# Patient Record
Sex: Male | Born: 1975 | Race: Black or African American | Hispanic: No | Marital: Married | Smoking: Current every day smoker
Health system: Southern US, Community
[De-identification: ages and names within clinical notes are randomized; demographics above are authoritative.]

## PROBLEM LIST (undated history)

## (undated) DIAGNOSIS — M549 Dorsalgia, unspecified: Secondary | ICD-10-CM

## (undated) DIAGNOSIS — I10 Essential (primary) hypertension: Secondary | ICD-10-CM

## (undated) DIAGNOSIS — I639 Cerebral infarction, unspecified: Secondary | ICD-10-CM

## (undated) DIAGNOSIS — N289 Disorder of kidney and ureter, unspecified: Secondary | ICD-10-CM

## (undated) DIAGNOSIS — R51 Headache: Secondary | ICD-10-CM

## (undated) DIAGNOSIS — Z8489 Family history of other specified conditions: Secondary | ICD-10-CM

## (undated) DIAGNOSIS — M1990 Unspecified osteoarthritis, unspecified site: Secondary | ICD-10-CM

## (undated) DIAGNOSIS — J45909 Unspecified asthma, uncomplicated: Secondary | ICD-10-CM

## (undated) DIAGNOSIS — G4730 Sleep apnea, unspecified: Secondary | ICD-10-CM

## (undated) HISTORY — PX: APPENDECTOMY: SHX54

## (undated) HISTORY — PX: OTHER SURGICAL HISTORY: SHX169

## (undated) HISTORY — PX: DG AV DIALYSIS  SHUNT ACCESS EXIST*L* OR: HXRAD910

## (undated) HISTORY — PX: NEPHRECTOMY TRANSPLANTED ORGAN: SUR880

---
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
## 2004-01-15 ENCOUNTER — Ambulatory Visit: Payer: Self-pay | Admitting: *Deleted

## 2004-01-15 ENCOUNTER — Ambulatory Visit: Payer: Self-pay | Admitting: Internal Medicine

## 2004-01-18 ENCOUNTER — Ambulatory Visit: Payer: Self-pay | Admitting: Internal Medicine

## 2004-01-26 ENCOUNTER — Ambulatory Visit: Payer: Self-pay | Admitting: Internal Medicine

## 2004-02-09 ENCOUNTER — Ambulatory Visit: Payer: Self-pay | Admitting: Internal Medicine

## 2004-03-11 ENCOUNTER — Ambulatory Visit: Payer: Self-pay | Admitting: Internal Medicine

## 2004-03-13 ENCOUNTER — Ambulatory Visit: Payer: Self-pay | Admitting: Internal Medicine

## 2004-04-08 ENCOUNTER — Ambulatory Visit: Payer: Self-pay | Admitting: Internal Medicine

## 2004-04-12 ENCOUNTER — Ambulatory Visit: Payer: Self-pay | Admitting: Internal Medicine

## 2004-04-15 ENCOUNTER — Ambulatory Visit (HOSPITAL_COMMUNITY): Admission: RE | Admit: 2004-04-15 | Discharge: 2004-04-15 | Payer: Self-pay | Admitting: Internal Medicine

## 2004-06-29 ENCOUNTER — Emergency Department (HOSPITAL_COMMUNITY): Admission: EM | Admit: 2004-06-29 | Discharge: 2004-06-29 | Payer: Self-pay | Admitting: Emergency Medicine

## 2004-11-04 ENCOUNTER — Ambulatory Visit: Payer: Self-pay | Admitting: Internal Medicine

## 2005-01-07 ENCOUNTER — Ambulatory Visit: Payer: Self-pay | Admitting: Internal Medicine

## 2005-05-27 ENCOUNTER — Ambulatory Visit: Payer: Self-pay | Admitting: Internal Medicine

## 2005-06-03 ENCOUNTER — Ambulatory Visit (HOSPITAL_COMMUNITY): Admission: RE | Admit: 2005-06-03 | Discharge: 2005-06-03 | Payer: Self-pay | Admitting: Vascular Surgery

## 2005-06-15 ENCOUNTER — Emergency Department (HOSPITAL_COMMUNITY): Admission: EM | Admit: 2005-06-15 | Discharge: 2005-06-15 | Payer: Self-pay | Admitting: Emergency Medicine

## 2005-06-24 ENCOUNTER — Ambulatory Visit (HOSPITAL_BASED_OUTPATIENT_CLINIC_OR_DEPARTMENT_OTHER): Admission: RE | Admit: 2005-06-24 | Discharge: 2005-06-24 | Payer: Self-pay | Admitting: Nephrology

## 2005-06-29 ENCOUNTER — Ambulatory Visit: Payer: Self-pay | Admitting: Internal Medicine

## 2005-09-02 ENCOUNTER — Encounter: Payer: Self-pay | Admitting: Internal Medicine

## 2005-09-03 ENCOUNTER — Encounter (INDEPENDENT_AMBULATORY_CARE_PROVIDER_SITE_OTHER): Payer: Self-pay | Admitting: Specialist

## 2005-09-03 ENCOUNTER — Observation Stay (HOSPITAL_COMMUNITY): Admission: AD | Admit: 2005-09-03 | Discharge: 2005-09-03 | Payer: Self-pay | Admitting: General Surgery

## 2005-09-09 ENCOUNTER — Ambulatory Visit (HOSPITAL_COMMUNITY): Admission: RE | Admit: 2005-09-09 | Discharge: 2005-09-09 | Payer: Self-pay | Admitting: Nephrology

## 2005-09-30 ENCOUNTER — Ambulatory Visit (HOSPITAL_COMMUNITY): Admission: RE | Admit: 2005-09-30 | Discharge: 2005-09-30 | Payer: Self-pay | Admitting: Vascular Surgery

## 2005-10-13 ENCOUNTER — Emergency Department (HOSPITAL_COMMUNITY): Admission: EM | Admit: 2005-10-13 | Discharge: 2005-10-13 | Payer: Self-pay | Admitting: Emergency Medicine

## 2006-01-15 ENCOUNTER — Emergency Department (HOSPITAL_COMMUNITY): Admission: EM | Admit: 2006-01-15 | Discharge: 2006-01-15 | Payer: Self-pay | Admitting: Emergency Medicine

## 2006-06-30 ENCOUNTER — Encounter: Admission: RE | Admit: 2006-06-30 | Discharge: 2006-06-30 | Payer: Self-pay | Admitting: Nephrology

## 2006-08-10 ENCOUNTER — Emergency Department (HOSPITAL_COMMUNITY): Admission: EM | Admit: 2006-08-10 | Discharge: 2006-08-10 | Payer: Self-pay | Admitting: Emergency Medicine

## 2008-10-29 ENCOUNTER — Emergency Department (HOSPITAL_COMMUNITY): Admission: EM | Admit: 2008-10-29 | Discharge: 2008-10-29 | Payer: Self-pay | Admitting: Emergency Medicine

## 2009-01-30 ENCOUNTER — Ambulatory Visit: Payer: Self-pay | Admitting: Vascular Surgery

## 2009-02-20 ENCOUNTER — Encounter: Admission: RE | Admit: 2009-02-20 | Discharge: 2009-02-20 | Payer: Self-pay | Admitting: Nephrology

## 2009-03-09 ENCOUNTER — Ambulatory Visit (HOSPITAL_COMMUNITY): Admission: RE | Admit: 2009-03-09 | Discharge: 2009-03-09 | Payer: Self-pay | Admitting: Vascular Surgery

## 2009-03-09 ENCOUNTER — Ambulatory Visit: Payer: Self-pay | Admitting: Vascular Surgery

## 2009-04-26 ENCOUNTER — Ambulatory Visit: Payer: Self-pay | Admitting: Vascular Surgery

## 2009-05-04 ENCOUNTER — Emergency Department (HOSPITAL_COMMUNITY): Admission: EM | Admit: 2009-05-04 | Discharge: 2009-05-04 | Payer: Self-pay | Admitting: Emergency Medicine

## 2009-05-15 ENCOUNTER — Ambulatory Visit (HOSPITAL_COMMUNITY): Admission: RE | Admit: 2009-05-15 | Discharge: 2009-05-15 | Payer: Self-pay | Admitting: Vascular Surgery

## 2009-05-15 ENCOUNTER — Ambulatory Visit: Payer: Self-pay | Admitting: Vascular Surgery

## 2009-06-05 ENCOUNTER — Ambulatory Visit: Payer: Self-pay | Admitting: Vascular Surgery

## 2009-06-28 ENCOUNTER — Ambulatory Visit: Payer: Self-pay | Admitting: Vascular Surgery

## 2009-07-03 ENCOUNTER — Ambulatory Visit (HOSPITAL_COMMUNITY): Admission: RE | Admit: 2009-07-03 | Discharge: 2009-07-03 | Payer: Self-pay | Admitting: Nephrology

## 2009-07-04 ENCOUNTER — Ambulatory Visit (HOSPITAL_COMMUNITY): Admission: RE | Admit: 2009-07-04 | Discharge: 2009-07-04 | Payer: Self-pay | Admitting: Nephrology

## 2009-08-02 ENCOUNTER — Ambulatory Visit: Payer: Self-pay | Admitting: Vascular Surgery

## 2009-08-07 ENCOUNTER — Ambulatory Visit: Payer: Self-pay | Admitting: Vascular Surgery

## 2009-08-07 ENCOUNTER — Ambulatory Visit (HOSPITAL_COMMUNITY): Admission: RE | Admit: 2009-08-07 | Discharge: 2009-08-07 | Payer: Self-pay | Admitting: Vascular Surgery

## 2009-08-30 ENCOUNTER — Ambulatory Visit: Payer: Self-pay | Admitting: Vascular Surgery

## 2009-10-31 ENCOUNTER — Ambulatory Visit (HOSPITAL_COMMUNITY): Admission: RE | Admit: 2009-10-31 | Discharge: 2009-10-31 | Payer: Self-pay | Admitting: Nephrology

## 2010-04-13 ENCOUNTER — Encounter: Payer: Self-pay | Admitting: Internal Medicine

## 2010-06-10 LAB — POCT I-STAT 4, (NA,K, GLUC, HGB,HCT)
Glucose, Bld: 98 mg/dL (ref 70–99)
Hemoglobin: 14.3 g/dL (ref 13.0–17.0)
Potassium: 4.9 mEq/L (ref 3.5–5.1)
Sodium: 137 mEq/L (ref 135–145)

## 2010-06-12 LAB — DIFFERENTIAL
Basophils Relative: 0 % (ref 0–1)
Lymphocytes Relative: 13 % (ref 12–46)
Lymphs Abs: 0.9 10*3/uL (ref 0.7–4.0)
Monocytes Absolute: 0.4 10*3/uL (ref 0.1–1.0)
Monocytes Relative: 6 % (ref 3–12)
Neutro Abs: 5.1 10*3/uL (ref 1.7–7.7)
Neutrophils Relative %: 77 % (ref 43–77)

## 2010-06-12 LAB — COMPREHENSIVE METABOLIC PANEL
Albumin: 3.4 g/dL — ABNORMAL LOW (ref 3.5–5.2)
BUN: 30 mg/dL — ABNORMAL HIGH (ref 6–23)
Calcium: 9 mg/dL (ref 8.4–10.5)
Glucose, Bld: 136 mg/dL — ABNORMAL HIGH (ref 70–99)
Total Protein: 7.8 g/dL (ref 6.0–8.3)

## 2010-06-12 LAB — CBC
HCT: 37 % — ABNORMAL LOW (ref 39.0–52.0)
Hemoglobin: 12.7 g/dL — ABNORMAL LOW (ref 13.0–17.0)
MCHC: 34.4 g/dL (ref 30.0–36.0)
Platelets: 182 10*3/uL (ref 150–400)
RDW: 13.5 % (ref 11.5–15.5)

## 2010-06-12 LAB — POCT I-STAT 4, (NA,K, GLUC, HGB,HCT)
HCT: 35 % — ABNORMAL LOW (ref 39.0–52.0)
Hemoglobin: 11.9 g/dL — ABNORMAL LOW (ref 13.0–17.0)

## 2010-06-12 LAB — POTASSIUM: Potassium: 6 mEq/L — ABNORMAL HIGH (ref 3.5–5.1)

## 2010-06-24 LAB — POCT I-STAT 4, (NA,K, GLUC, HGB,HCT)
Potassium: 6.1 mEq/L — ABNORMAL HIGH (ref 3.5–5.1)
Sodium: 138 mEq/L (ref 135–145)

## 2010-06-24 LAB — POTASSIUM: Potassium: 6.1 mEq/L — ABNORMAL HIGH (ref 3.5–5.1)

## 2010-08-06 NOTE — Assessment & Plan Note (Signed)
OFFICE VISIT   Bradley [name], Bradley [name]  DOB:  [birth year]                                       08/02/2009  GNFAO#:[id number]   I saw the patient in the office today with a poorly maturing right  forearm AV fistula.  He had this fistula placed on 05/15/2009.  They  have attempted cannulation and he has had some problems with  infiltration and ecchymosis.  He was therefore sent for further  evaluation.  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   On examination the fistula has a good thrill although it is more  difficult to follow centrally.  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   I did interrogate with the duplex scanner myself today and there appears  to be a very large branch in the distal third of the forearm which is  easily identifiable by duplex and I think ligating this certainly should  help improve the chances of this fistula maturing adequately.  For this  reason I have recommended we ligate this competing branch and we will  also shoot an intraoperative fistulogram at that time to look for any  other potential problems.  In addition, while he is having anesthesia we  will go ahead and excise the aneurysm in his left upper arm fistula  which is bothering him quite a bit.  They could use his catheter for IV  access given that we will be working on both arms.  This has been  scheduled for a nondialysis day on 08/07/2009.     Di Kindle. Edilia Bo, M.D.  Electronically Signed   CSD/MEDQ  D:  08/02/2009  T:  08/03/2009  Job:  [id number]   cc:   [facility]

## 2010-08-06 NOTE — Procedures (Signed)
VASCULAR LAB EXAM   INDICATION:  Evaluation of an AV fistula.  History of renal failure.   HISTORY:  Diabetes:  Yes.  Cardiac:  Unknown.  Hypertension:  Unknown.   EXAM:  Right upper extremity AV fistula.   IMPRESSION:  1. No evidence suggesting steal to or away from the hand.  2. Velocities are >600 cm/s, suggesting distal anastomosis and distal      arteriovenous fistula stenosis.  3. Velocities are >400 cm/s, suggest stenosis in the mid and distal      radial artery.  4. A branch in the mid forearm taking away flow from arteriovenous      fistula.  Pressures with compression in mid arteriovenous fistula      are 255 cm/s and without compression and with arteriovenous fistula      is 137 cm/s.   ___________________________________________  Quita Skye. Hart Rochester, M.D.   CJ/MEDQ  D:  06/05/2009  T:  06/06/2009  Job:  [id number]

## 2010-08-06 NOTE — Assessment & Plan Note (Signed)
OFFICE VISIT   Bradley [name], Bradley [name]  DOB:  [birth year]                                       06/05/2009  GEXBM#:[id number]   Patient is a 35 year old black male with end-stage renal disease.  He is  status post placement of a right forearm arteriovenous fistula by Dr.  [name] on 05/15/2009.  Today he is being evaluated for  possible steal syndrome.  He reports that just after surgery, he  developed fairly severe numbness in his right thumb.  He also had pain  from his right wrist to the tip of his right thumb.  Apparently, he was  told it should get better in a couple days; however, it did not.  For  the first 2 weeks, symptoms were fairly severe but says over the last  week, they have slowly been starting to improve.  He initially had  problems with being able to grip things with his right hand but said  this has gotten much better as well.  Dr. Kathrene Bongo asked if we could  do evaluation for steal syndrome to rule out that this was causing his  symptoms.   He did have a vascular lab evaluation of his fistula, which showed no  evidence of steal to the hand but did show some branches in the AV  fistula that was taking flow away from it.  This was reviewed by Dr.  Josephina Gip.  At this point, it is felt too early to know if these  branches will interfere with the fistula's maturation.   Physical exam findings today show a blood pressure of 145/94, heart rate  of 90, and respirations of 20.  He is a well-nourished black male who  appears his stated age.  He has a right radiocephalic AV fistula with  incisions appear well-healed.  He did have a suture knot protruding  through the incision, which was cut.  There was no drainage or redness  from this area, however.  The fistula has a palpable thrill up to near  the antecubital area.  It is easy to palpate.  He has a 2+ ulnar pulse.  The hand is warm.  His grips are both relatively strong.   Patient  is 3 weeks status post a right Cimino AV fistula.  He continues  to have right thumb numbness with pain over this area.  The pain is more  pronounced with touching.  There is no redness to indicate an infection.  Tests do not indicate steal; therefore, I feel that the pain is more  related to intraoperative irritation of the radial nerve.  By his  report, it has been improving and suspect it will continue to improve  over the next several months.  He already has an appointment to see Dr.  Edilia Bo on April 7th.  We will have him keep this appointment so that  his fistula can be evaluated for further maturation and to ensure that  his symptoms continue to improve.  He was instructed to call sooner if  his symptoms worsen.  I did give him a prescription for oxycodone 5 mg 1  tablet p.o. q.4h. p.r.n. for pain if needed.  He was told that this  would not help with his numbness, however.  Twenty pills were  prescribed.   Jerold Coombe, PA   Quita Skye.  Hart Rochester, M.D.  Electronically Signed   AWZ/MEDQ  D:  06/05/2009  T:  06/06/2009  Job:  [id number]   cc:   Cecille Aver, M.D.

## 2010-08-06 NOTE — Assessment & Plan Note (Signed)
OFFICE VISIT   Bradley [name], Bradley [name]  DOB:  05-08-1975                                       04/26/2009  ZOXWR#:[id number]   Mr. Giammona is a patient in whom Dr. Edilia Bo has previously performed a  left brachiocephalic fistula and most recently placed a palindrome  catheter.  He returns to clinic today for evaluation of his cephalic  fistula.  The clinic physician at this time is Dr. Edilia Bo.   Mr. Knope is a very pleasant 35 year old gentleman who currently is in  end-stage renal disease, on hemodialysis.  As was previously stated, he  has had a right brachiocephalic fistula placed sometime ago.  Over the  life of his fistula, it has currently become aneurysmal.  It was felt  back in December that placement of a palindrome catheter would be  appropriate in order to allow time for the fistula to rest.  At that  time, he was having problems with blood clotting in his fistula.   His past medical history does consists of high blood pressure, which is  controlled at present.  He is hypothyroid, which is also controlled.   He is married.  He has 1 child, and his wife is pregnant at this time.  He currently smokes approximately 1/2 pack of cigarettes per day.   REVIEW OF SYSTEMS:  With the exception of the fistula difficulties, was  entirely negative.   PHYSICAL FINDINGS:  Blood pressure was 125/78.  O2 saturation was 100%.  Heart rate was 107.  The patient was well-nourished and in no apparent  distress.  HEENT:  PERRLA, EOMI, and normal conjunctiva.  Lungs were  clear to auscultation.  Cardiac exam revealed a regular rate and rhythm.  Musculoskeletal exam demonstrated no major deformities.  There were no  focal weaknesses or paresthesias.  The fistula itself was aneurysmal in  the proximal portion with some fluctuation.  There was several other  aneurysms distally.   At this time, I requested Dr. Edilia Bo to enter assistance for the  evaluation of this patient.  He  entered the room and evaluated Mr. Kessner  and his fistula.  We elected to obtain upper extremity vein mapping to  evaluate him for another fistula in his left arm.  He did appear to have  adequate vein, and Dr. Edilia Bo did offer one ligation of his existing  fistula with placement of another AV fistula, to be done on February 22.   Of note, he does have an allergy to penicillin.   His current medications consist of Sensipar 60 mg p.o. daily, lisinopril  40 mg p.o. daily, Percocet 10/325 one p.o. q.12h. p.r.n. pain, ibuprofen  800 mg p.o. q.8h. p.r.n. pain, Vicodin as directed, Monopril 40 mg 1  p.o. daily, Fosrenol 1 p.o. with meals, PhosLo 667 mg 3 tablets with  meals, Nephro-Vite 1 p.o. daily, Plavix 75 mg p.o. daily.   Plan is for surgery on February 22nd for placement of an AV fistula and  ligation of existing fistula.   Wilmon Arms, PA   Di Kindle. Edilia Bo, M.D.  Electronically Signed   KEL/MEDQ  D:  04/26/2009  T:  04/26/2009  Job:  [id number]

## 2010-08-06 NOTE — Assessment & Plan Note (Signed)
OFFICE VISIT   Paglia, Dade B  DOB:  1975-12-14                                       08/30/2009  VWUJW#:[id number]   I saw Mr. Ocallaghan in the office today for followup after ligating a large  competing branch of his right forearm AV fistula.  We also excised the  aneurysm of a chronically occluded left upper arm AV fistula.  He comes  in to have the fistula checked.  Procedure was performed on Aug 07, 2009.   He states that since surgery, they have been cannulating the fistula  with 1 needle and this seemed to work fairly well for the time being.  They will continue with 1 needle and use a catheter for the second.   On examination, blood pressure 111/73, heart rate is 103.  The fistula  appears to be gradually maturing and has an easily palpable thrill  throughout the forearm.  Hopefully this will continue to mature and  provide adequate access so that his catheter can be removed.  Otherwise  we would have to consider new access.  I will see him back p.r.n.     Di Kindle. Edilia Bo, M.D.  Electronically Signed   CSD/MEDQ  D:  08/30/2009  T:  08/31/2009  Job:  [id number]   cc:   [facility]

## 2010-08-06 NOTE — Consult Note (Signed)
NEW PATIENT CONSULTATION   [name], [name]  DOB:  02/06/1976                                       01/30/2009  ZOXWR#:[id number]   The patient is a 35 year old male with end-stage renal disease on  hemodialysis for at least the past 3-1/2 years.  He had an AV fistula  created by Dr. Cari [name] in March 2007, and we have not seen the  patient since that time.  The fistula has functioned well and he has had  2 thrombotic episodes over the past 3-1/2 years, most recently in April  of this year and in [place], he had thrombolysis and apparently some  stents placed at some unknown location.  The fistula has functioned well  since then but there has some been some concern about infection because  of some slight purulent drainage on 1 occasion recently, although with  antibiotics the patient states that has resolved.  He has had no chills  and fever.  He says the fistula is functioning well now with good flow.   CHRONIC STABLE MEDICAL PROBLEMS:  1. Hypertension.  2. Secondary hypoparathyroidism.  3. End-stage renal disease.  4. Obesity.  5. Tobacco abuse.   Negative for hyperlipidemia, diabetes, coronary artery disease, stroke  or COPD.   PAST SURGICAL HISTORY:  1. Left upper extremity AVF.  2. Appendectomy.   FAMILY HISTORY:  Negative for coronary artery disease, diabetes and  stroke.   SOCIAL HISTORY:  He is married, has 1 child.  Smokes half a pack of  cigarettes per day.  Does not use alcohol.   REVIEW OF SYSTEMS:  Denies any chest pain, dyspnea on exertion, chronic  bronchitis, wheezing.  No appetite problems.  Does have joint and muscle  pain and anemia.  All other systems negative.   PHYSICAL EXAMINATION:  Blood pressure 116/73, heart rate is 86,  respirations 14, temperature 98.  Generally, he is a male patient who is  in no apparent distress, alert and oriented x3.  He is significantly  obese.  The carotid pulses are 3+ with no audible  bruits.  Neurologic  exam is normal.  No palpable adenopathy in the neck.  Chest:  Clear to  auscultation.  Cardiovascular exam is a regular rhythm.  No murmurs.  Abdomen is obese.  No palpable masses.  Left upper extremity has an  upper arm AV fistula, which is quite dilated but no evidence of any  ulceration or impending rupture.  Maximum diameter is about 2.5-3 cm in  the distal upper arm.  He does have good pulse and palpable thrill.  There is no evidence of steal distally.   I do not see any evidence of active infection at this time.  He does  have a fistula which seems be continuing to function well despite a  thrombotic episode 8 months ago.  I would not recommend any treatment at  this time as long as the fistula is functioning well.  If it does not  function adequately, I would obtain a fistulogram and would be happy to  reevaluate him at that time if that occurs.   Bradley [name], M.D.  Electronically Signed   JDL/MEDQ  D:  01/30/2009  T:  01/31/2009  Job:  [id number]   cc:   Bradley [name], M.D.

## 2010-08-06 NOTE — Assessment & Plan Note (Signed)
OFFICE VISIT   [name], [name]  DOB:  02-Apr-1975                                       06/28/2009  VHQIO#:[id number]   Date of surgery was 05/15/2009.  Referring physician is Dr.  Kathrene Bongo.  The patient is a very pleasant 35 year old gentleman in  whom Dr. Edilia Bo placed a right IJ Diatek catheter and right forearm AV  fistula.  He was seen in clinic 06/05/2009 to be evaluated for possible  steal syndrome due to severe numbness in his right thumb.  The vascular  lab evaluated his fistula which demonstrated no evidence of steal to the  hand.  It did demonstrate some competing branches.  He was instructed to  keep his appointment with Dr. Edilia Bo on 06/28/2009.   PHYSICAL FINDINGS:  General:  Revealed a very pleasant young man who  appeared his stated age.  Vital signs:  Heart rate was 90, blood  pressure 137/90, O2 sat was 100%.  HEENT:  PERRLA, EOMI, normal  conjunctivae.  Lungs:  Were clear to auscultation.  Cardiac:  Exam  revealed a regular rate and rhythm.  Abdomen:  Was soft, nontender,  nondistended.  He was obese.  His right arm fistula was evaluated.  The  incision is healing well.  The AV fistula does appear to be maturing.  He has regained sensation in his thumb.   Of note, the vascular lab did demonstrate evidence of branches in the AV  fistula which were competing.  It is felt to be at this time it is felt  to be too early to know if the competing branches will interfere with  the fistula's maturation.  We will leave that to the discretion of Dr.  Kathrene Bongo and the renal physicians.  Should the fistula not be  maturing and need further procedure to further work, we will be happy to  evaluate him at that time.   Bradley Arms, PA   Bradley [name]. Edilia Bo, M.D.  Electronically Signed   KEL/MEDQ  D:  06/28/2009  T:  06/28/2009  Job:  [id number]   cc:   Cecille Aver, M.D.

## 2010-08-06 NOTE — Procedures (Signed)
CEPHALIC VEIN MAPPING   INDICATION:  End-stage renal disease.   HISTORY:  End-stage renal disease.   EXAM:   The right cephalic vein is compressible.   Diameter measurements range from 0.38 to 0.54.   See attached worksheet for all measurements.   IMPRESSION:  Patent right cephalic vein and basilic veins, which is of  acceptable diameter for use as a dialysis access site.   ___________________________________________  Di Kindle. Edilia Bo, M.D.   MG/MEDQ  D:  04/26/2009  T:  04/26/2009  Job:  [id number]

## 2010-08-09 NOTE — H&P (Signed)
Bradley [name], BURGHER                ACCOUNT NO.:  [id number]   MEDICAL RECORD NO.:  [id number]          PATIENT TYPE:  INP   LOCATION:  0102                         [facility]ACILITY[facility]   PHYSICIAN:  Lebron Conners, M.D.   DATE OF BIRTH:  03-27-1975   DATE OF ADMISSION:  09/02/2005  DATE OF DISCHARGE:                                HISTORY & PHYSICAL   CHIEF COMPLAINT:  Abdominal pain.   HISTORY OF PRESENT ILLNESS:  Bradley [name] is a 35 year old man who presented a  little bit greater than 24 hours of abdominal pain.  He first experienced  nausea and vomiting with it, generalized, settled in the right side of the  abdomen and became more severe.  He came to the emergency room for  evaluation where blood count was 8.8 but marked left shift.  He had a CT  scan that showed inflammatory stranding in the area of the appendix and he  was felt to have acute appendicitis and I was consulted.  No previous  chronic GI problems or similar episodes of pain and illness.   PAST HISTORY:  He has chronic renal failure and is on dialysis with a  catheter.  He has had an AV fistula created but has not used.  He was  dialyzed yesterday.  He was on fosinopril 10 mg daily, diltiazem 360 mg  daily for hypertension which was felt to be the cause of his renal failure.  He does not have diabetes.  He has allergy to PENICILLIN which has caused  some swelling of the face.  The patient smokes cigarettes.  He does not  drink alcoholic beverages.  A 15-point review of systems is negative for all  serious bowel symptoms.  He does have obstructive sleep apnea and using C-  PAP.   PHYSICAL EXAM:  The patient is no acute distress.  Mental status normal.  Vital Signs unremarkable per nurse.  He is very obese.  Head and neck exam  unremarkable with no palpable thyroid enlargement.  No neck mass.  Chest is  clear to auscultation.  Heart rate and rhythm normal.  No murmur or gallop.  Abdomen has marked right lower quadrant and  right mid-abdominal tenderness.  No mass.  Bowel sounds present.  Genitalia normal.  Rectal was not  performed.  Extremities have no edema.  No lesions.   IMPRESSION:  Acute appendicitis.   PLAN:  Immediate appendectomy.  The patient consents to have this done.      Lebron Conners, M.D.  Electronically Signed     WB/MEDQ  D:  09/03/2005  T:  09/03/2005  Job:  [id number]

## 2010-08-09 NOTE — Procedures (Signed)
Bradley [name], Bradley [name]                ACCOUNT NO.:  [id number]   MEDICAL RECORD NO.:  [id number]          PATIENT TYPE:  OUT   LOCATION:  SLEEP CENTER                 FACILITY:  [facility]   PHYSICIAN:  Clinton D. Maple Hudson, M.D. DATE OF BIRTH:  06-12-1975   DATE OF STUDY:                              NOCTURNAL POLYSOMNOGRAM   REFERRING PHYSICIAN:  Dr. Marjory Sneddon.   INDICATIONS FOR STUDY:  Hypersomnia with sleep apnea.  Epworth sleepiness  score 10/24, BMI 59.  Weight 440 pounds.   HOME MEDICATIONS:  Include furosemide, Tiazac, lisinopril, Cardura,  Hectorol.   SLEEP ARCHITECTURE:  Total sleep time 398 minutes with sleep efficiency 94%.  Stage I 6%, stage II 39%, stages III and IV 16%, REM 39% of total sleep  time.  Sleep latency 5 minutes, REM latency 97 minutes, awake after sleep  onset 18 minutes, arousal index increased at 34.9.  No bedtime medication  taken.   RESPIRATORY DATA:  Split study protocol.  Apnea/hypopnea index (AHI, RDI)  167.2 obstructive events per hour indicating very severe obstructive sleep  apnea/hypopnea syndrome before CPAP.  This included 301 obstructive apneas,  one central apnea and 52 hypopneas before CPAP.  Most sleep and therefore  most events were recorded while supine.  Significant number events were also  recorded while sleeping on his right side suggesting pattern is  nonpositional.  REM AHI 34.3 per hour.  CPAP titration was to 25 CWP, AHI of  3.6 per hour.  A ResMed Swift with medium nasal pillows was used.  Oral  venting required use of a chin strap.   OXYGEN DATA:  Loud snoring with oxygen desaturation to a nadir of 51% before  CPAP.  After CPAP control, saturation held 95-98% on room air.   CARDIAC DATA:  Normal sinus rhythm.   MOVEMENT/PARASOMNIA:  Occasional limb jerk with little effect on sleep.   IMPRESSION/RECOMMENDATIONS:  1.  Very severe obstructive sleep apnea/hypopnea syndrome, AHI 167.2 per      hour with events somewhat more  common supine but essentially      nonpositional.  Loud snoring with oxygen desaturation to a nadir of 51%.  2.  CPAP titration to 25 mL CWP, AHI 3.6 per hour.  Pressures in this range      required delivery by a BiPAP machine and better tolerance may be      obtained trying an inspiratory pressure of 25 CWP and an expiratory      pressure of 15-20 CWP.  A heated humidifier and medium ResMed Swift      nasal pillows mask were used.  He may benefit from a chin strap as well.      Clinton D. Maple Hudson, M.D.  Diplomate, Biomedical engineer of Sleep Medicine  Electronically Signed     CDY/MEDQ  D:  06/29/2005 12:05:14  T:  06/30/2005 07:18:11  Job:  [id number]

## 2010-08-09 NOTE — Op Note (Signed)
Bradley [name], Bradley [name]                ACCOUNT NO.:  [id number]   MEDICAL RECORD NO.:  [id number]          PATIENT TYPE:  AMB   LOCATION:  SDS                          FACILITY:  [facility]   PHYSICIAN:  Di Kindle. Edilia Bo, M.D.DATE OF BIRTH:  1975/10/14   DATE OF PROCEDURE:  06/03/2005  DATE OF DISCHARGE:                                 OPERATIVE REPORT   PREOPERATIVE DIAGNOSIS:  Chronic renal failure.   POSTOPERATIVE DIAGNOSIS:  Chronic renal failure.   PROCEDURE:  1.  Ultrasound-guided placement of right IJ Diatek catheter.  2.  Placement of left upper arm AV fistula.   SURGEON:  Edilia Bo.   ASSISTANT:  Nurse.   ANESTHESIA:  Local with sedation.   TECHNIQUE:  The patient was taken to the operating sedated by anesthesia.  The ultrasound scanner was used to mark both internal jugular veins both of  which appeared to be patent. The neck and upper chest were then prepped and  draped in usual sterile fashion. After the skin was infiltrated with 1%  lidocaine, the right IJ was cannulated and guidewire introduced into the  superior vena cava under fluoroscopic control. Tract over the wire was  dilated and then a dilator and peel-away sheath were passed over the wire  and wire and dilator removed. The catheter was passed through the peel-away  sheath, positioned in the right atrium. The exit site for the catheter was  selected and the skin anesthetized between the two areas. The catheter was  then brought through the tunnel cut to the appropriate length and the distal  ports were attached. Both ports withdrew easily, were then flushed with  heparinized saline filled with concentrated heparin. The catheter was  secured at its exit site with a 3-0 nylon suture. The IJ cannulation site  was closed with 4-0 subcuticular stitch. Sterile dressing was applied.   Next attention was turned placement of a fistula in the left arm. The left  upper extremity was prepped and draped in usual sterile  fashion. After the  skin was infiltrated with 1% lidocaine, a transverse incision was made just  above the antecubital level. The brachial artery was dissected free beneath  the fascia. The cephalic vein was dissected free and ligated distally,  irrigated up nicely with heparinized saline. The patient was then  heparinized with 9000 units of IV heparin. Brachial artery was clamped  proximally and distally and a longitudinal arteriotomy was made. The vein  was mobilized over and sewn end-to-side to the artery using continuous 6-0  Prolene suture. At completion there was an excellent thrill in the fistula.  Hemostasis was obtained in the wound and the heparin was partially reversed  with  protamine. The wound was closed with deep layer of 3-0 Vicryl. The skin  closed with 4-0 Vicryl. Sterile dressing was applied. The patient tolerated  the procedure well, was transferred to the recovery room in satisfactory  condition. All needle and sponge counts were correct.      Di Kindle. Edilia Bo, M.D.  Electronically Signed     CSD/MEDQ  D:  06/03/2005  T:  06/04/2005  Job:  [id number]

## 2010-08-09 NOTE — Op Note (Signed)
Bradley [name], Bradley [name]                ACCOUNT NO.:  [id number]   MEDICAL RECORD NO.:  [id number]          PATIENT TYPE:  INP   LOCATION:  8119                         FACILITY:  [facility]   PHYSICIAN:  Lebron Conners, M.D.   DATE OF BIRTH:  14-Feb-1976   DATE OF PROCEDURE:  09/03/2005  DATE OF DISCHARGE:                                 OPERATIVE REPORT   PREOPERATIVE DIAGNOSIS:  Acute appendicitis.   POSTOPERATIVE DIAGNOSIS:  Acute appendicitis.   OPERATION:  Laparoscopic appendectomy.   SURGEON:  Lebron Conners, M.D.   ANESTHESIA:  General.   COMPLICATIONS:  None.   BLOOD LOSS:  Minimal.   SPECIMEN:  Appendix.   PROCEDURE:  After the patient was monitored and anesthetized and had a Foley  catheter and routine preparation and draping of the abdomen, I infused local  anesthetic just below the umbilicus and made a 3 cm incision vertically and  dissected down through the subcutaneous tissues to the fascia.  I opened it  for 2 cm and bluntly opened the peritoneum and placed a 0 Vicryl pursestring  suture.  I secured a Hassan cannula with a pursestring and inflated the  abdomen with CO2 and put in the scope and noted inflammation in the right  lower quadrant.  I put in a 5 mm right upper quadrant port and an 11 mm  lower midline port under direct view.  I then manipulated the bowel and  broke up adhesions and was able to grasp the appendix and elevate it.  It  had suppurative inflammation but did not appear to be perforated.  I  dissected the mesentery some until I felt I had the appendix completely  mobilized, and I had thinned down the edematous thick mesentery somewhat.  I  then used an endoscopic cutting stapler of the vascular type to staple  across the base of the appendix and the mesentery.  Hemostasis was good.  I  placed the appendix in a plastic pouch.  I irrigated the right lower  quadrant and pelvis and removed the irrigant.  Staple line appeared secure.  I removed the  appendix through the umbilical incision and tied the  pursestring suture.  I then removed the right upper quadrant port under  direct view and saw no bleeding from the belly wall.  I removed the lower  midline port after allowing the carbon dioxide to escape.  I closed all skin  incisions with intracuticular 4-0 Vicryl and Steri-Strips.  The patient was  stable through the procedure.      Lebron Conners, M.D.  Electronically Signed    WB/MEDQ  D:  09/03/2005  T:  09/03/2005  Job:  [id number]

## 2010-09-09 ENCOUNTER — Emergency Department (HOSPITAL_COMMUNITY)
Admission: EM | Admit: 2010-09-09 | Discharge: 2010-09-09 | Disposition: A | Discharge status: Home / Self Care | Payer: Medicare Other | Attending: Emergency Medicine | Admitting: Emergency Medicine

## 2010-09-09 ENCOUNTER — Emergency Department (HOSPITAL_COMMUNITY): Payer: Medicare Other

## 2010-09-09 ENCOUNTER — Other Ambulatory Visit (HOSPITAL_COMMUNITY): Payer: Medicare Other

## 2010-09-09 ENCOUNTER — Other Ambulatory Visit (HOSPITAL_COMMUNITY): Payer: Self-pay

## 2010-09-09 DIAGNOSIS — Z79899 Other long term (current) drug therapy: Secondary | ICD-10-CM | POA: Insufficient documentation

## 2010-09-09 DIAGNOSIS — I129 Hypertensive chronic kidney disease with stage 1 through stage 4 chronic kidney disease, or unspecified chronic kidney disease: Secondary | ICD-10-CM | POA: Insufficient documentation

## 2010-09-09 DIAGNOSIS — N189 Chronic kidney disease, unspecified: Secondary | ICD-10-CM | POA: Insufficient documentation

## 2010-09-09 DIAGNOSIS — R071 Chest pain on breathing: Secondary | ICD-10-CM | POA: Insufficient documentation

## 2010-09-09 LAB — COMPREHENSIVE METABOLIC PANEL
AST: 16 U/L (ref 0–37)
Albumin: 3.4 g/dL — ABNORMAL LOW (ref 3.5–5.2)
Alkaline Phosphatase: 62 U/L (ref 39–117)
CO2: 28 mEq/L (ref 19–32)
Chloride: 94 mEq/L — ABNORMAL LOW (ref 96–112)
Creatinine, Ser: 14.24 mg/dL — ABNORMAL HIGH (ref 0.50–1.35)
GFR calc non Af Amer: 4 mL/min — ABNORMAL LOW (ref >60)
Potassium: 5.1 mEq/L (ref 3.5–5.1)
Total Bilirubin: 0.3 mg/dL (ref 0.3–1.2)

## 2010-09-09 LAB — TROPONIN I: Troponin I: <0.3 ng/mL (ref <0.30)

## 2010-09-09 LAB — DIFFERENTIAL
Eosinophils Absolute: 0.3 10*3/uL (ref 0.0–0.7)
Eosinophils Relative: 4 % (ref 0–5)
Lymphocytes Relative: 19 % (ref 12–46)
Lymphs Abs: 1.1 10*3/uL (ref 0.7–4.0)
Monocytes Relative: 9 % (ref 3–12)

## 2010-09-09 LAB — CBC
HCT: 40 % (ref 39.0–52.0)
MCH: 30.6 pg (ref 26.0–34.0)
MCV: 89.9 fL (ref 78.0–100.0)
Platelets: 226 10*3/uL (ref 150–400)
RBC: 4.45 MIL/uL (ref 4.22–5.81)
RDW: 13.1 % (ref 11.5–15.5)

## 2010-09-09 MED ORDER — XENON XE 133 GAS
10.0000 | GAS_FOR_INHALATION | Freq: Once | RESPIRATORY_TRACT | Status: AC | PRN
  Administered 2010-09-09: 10 via RESPIRATORY_TRACT

## 2010-09-09 MED ORDER — TECHNETIUM TO 99M ALBUMIN AGGREGATED
6.0000 | Freq: Once | INTRAVENOUS | Status: AC | PRN
  Administered 2010-09-09: 6 via INTRAVENOUS

## 2011-02-09 ENCOUNTER — Encounter: Payer: Self-pay | Admitting: *Deleted

## 2011-02-09 ENCOUNTER — Emergency Department (HOSPITAL_COMMUNITY)
Admission: EM | Admit: 2011-02-09 | Discharge: 2011-02-09 | Disposition: A | Discharge status: Home / Self Care | Payer: Medicare Other | Attending: Emergency Medicine | Admitting: Emergency Medicine

## 2011-02-09 DIAGNOSIS — IMO0001 Reserved for inherently not codable concepts without codable children: Secondary | ICD-10-CM | POA: Insufficient documentation

## 2011-02-09 DIAGNOSIS — L0231 Cutaneous abscess of buttock: Secondary | ICD-10-CM | POA: Insufficient documentation

## 2011-02-09 MED ORDER — OXYCODONE-ACETAMINOPHEN 5-325 MG PO TABS
ORAL_TABLET | ORAL | Status: AC
  Filled 2011-02-09: qty 1

## 2011-02-09 MED ORDER — DIPHENHYDRAMINE HCL 50 MG/ML IJ SOLN
INTRAMUSCULAR | Status: AC
  Filled 2011-02-09: qty 1

## 2011-02-09 MED ORDER — DOXYCYCLINE HYCLATE 100 MG PO CAPS: 100.0000 mg | ORAL_CAPSULE | Freq: Two times a day (BID) | ORAL | Status: AC

## 2011-02-09 NOTE — ED Notes (Signed)
EDP TO i&d abcess

## 2011-02-09 NOTE — ED Provider Notes (Signed)
History     CSN: [id number] Arrival date & time: 02/09/2011  3:20 PM   First MD Initiated Contact with Patient 02/09/11 1622      Chief Complaint  Patient presents with  . Recurrent Skin Infections     HPI Reports 2-3 days of worsening pain in his right buttock associated with a sense of fullness on the right lateral cheek.  His had no fevers or chills.  He is unable to visualize this area.  His had a history of abscesses in the past.  He reports this is similar in nature to this.  Pain is moderate.  It is worsened by palpation sitting on his right buttock.  Is relieved by standing. History reviewed. No pertinent past medical history.  History reviewed. No pertinent past surgical history.  History reviewed. No pertinent family history.  History  Substance Use Topics  . Smoking status: Current Some Day Smoker    Types: Cigarettes  . Smokeless tobacco: Not on file  . Alcohol Use: No      Review of Systems  All other systems reviewed and are negative.    Allergies  Penicillins  Home Medications   Current Outpatient Rx  Name Route Sig Dispense Refill  . CINACALCET HCL 60 MG PO TABS Oral Take 60 mg by mouth daily.      Marland Kitchen RENA-VITE PO TABS Oral Take 1 tablet by mouth daily.      Marland Kitchen SEVELAMER CARBONATE 800 MG PO TABS Oral Take 3,200 mg by mouth 3 (three) times daily with meals.      Marland Kitchen TEMAZEPAM 30 MG PO CAPS Oral Take 30 mg by mouth at bedtime as needed. For sleep.     Marland Kitchen DOXYCYCLINE HYCLATE 100 MG PO CAPS Oral Take 1 capsule (100 mg total) by mouth 2 (two) times daily. 20 capsule 0    BP 149/92  Pulse 117  Temp(Src) 98.7 F (37.1 C) (Oral)  Resp 20  SpO2 98%  Physical Exam  Constitutional: He is oriented to person, place, and time. He appears well-developed and well-nourished.  HENT:  Head: Normocephalic.  Eyes: EOM are normal.  Neck: Normal range of motion.  Pulmonary/Chest: Effort normal.  Musculoskeletal: Normal range of motion.       Right lateral buttock  at skin fold demonstrates evidence of fluctuance pointing mass without drainage.  There is no surrounding erythema or streaking erythema.  Is tenderness to palpation.  Neurological: He is alert and oriented to person, place, and time.  Psychiatric: He has a normal mood and affect.    ED Course  Procedures (including critical care time)  INCISION AND DRAINAGE Performed by: Lyanne Co Consent: Verbal consent obtained. Risks and benefits: risks, benefits and alternatives were discussed Time out performed prior to procedure Type: abscess Body area: right buttock Anesthesia: local infiltration Local anesthetic: lidocaine 2% with epinephrine Anesthetic total: 3 ml Complexity: complex Blunt dissection to break up loculations Drainage: purulent drainage amount: moderate Patient tolerance: Patient tolerated the procedure well with no immediate complications.     Labs Reviewed - No data to display No results found.   1. Abscess of right buttock       MDM  Incision and drainage of right buttock .  Abscess was too small to place packing.,  Doxycycline.        Lyanne Co, MD 02/09/11 [phone]

## 2011-02-09 NOTE — ED Notes (Signed)
Patient with an abcess to the right cheek of his buttock

## 2011-04-18 ENCOUNTER — Other Ambulatory Visit (HOSPITAL_COMMUNITY): Payer: Self-pay | Admitting: Nephrology

## 2011-04-18 DIAGNOSIS — N186 End stage renal disease: Secondary | ICD-10-CM

## 2011-04-23 ENCOUNTER — Encounter (HOSPITAL_COMMUNITY): Payer: Self-pay | Admitting: Pharmacy Technician

## 2011-04-29 ENCOUNTER — Ambulatory Visit (HOSPITAL_COMMUNITY): Admission: RE | Admit: 2011-04-29 | Payer: Medicare Other | Source: Ambulatory Visit

## 2011-04-29 ENCOUNTER — Encounter (HOSPITAL_COMMUNITY): Payer: Self-pay | Admitting: Pharmacy Technician

## 2011-05-08 ENCOUNTER — Ambulatory Visit (HOSPITAL_COMMUNITY): Admission: RE | Admit: 2011-05-08 | Payer: Medicare Other | Source: Ambulatory Visit

## 2011-09-09 ENCOUNTER — Other Ambulatory Visit (HOSPITAL_COMMUNITY): Payer: Self-pay | Admitting: Orthopedic Surgery

## 2011-09-29 ENCOUNTER — Emergency Department (HOSPITAL_COMMUNITY)
Admission: EM | Admit: 2011-09-29 | Discharge: 2011-09-29 | Disposition: A | Discharge status: Home / Self Care | Payer: Medicare Other

## 2011-10-21 ENCOUNTER — Encounter (HOSPITAL_COMMUNITY): Payer: Self-pay | Admitting: Emergency Medicine

## 2011-10-21 ENCOUNTER — Emergency Department (HOSPITAL_COMMUNITY)
Admission: EM | Admit: 2011-10-21 | Discharge: 2011-10-21 | Disposition: A | Discharge status: Home / Self Care | Payer: Medicare Other | Attending: Emergency Medicine | Admitting: Emergency Medicine

## 2011-10-21 DIAGNOSIS — N289 Disorder of kidney and ureter, unspecified: Secondary | ICD-10-CM | POA: Insufficient documentation

## 2011-10-21 DIAGNOSIS — F17200 Nicotine dependence, unspecified, uncomplicated: Secondary | ICD-10-CM | POA: Insufficient documentation

## 2011-10-21 DIAGNOSIS — I10 Essential (primary) hypertension: Secondary | ICD-10-CM | POA: Insufficient documentation

## 2011-10-21 DIAGNOSIS — L03317 Cellulitis of buttock: Secondary | ICD-10-CM | POA: Insufficient documentation

## 2011-10-21 DIAGNOSIS — L0231 Cutaneous abscess of buttock: Secondary | ICD-10-CM | POA: Insufficient documentation

## 2011-10-21 HISTORY — DX: Disorder of kidney and ureter, unspecified: N28.9

## 2011-10-21 HISTORY — DX: Essential (primary) hypertension: I10

## 2011-10-21 MED ORDER — OXYCODONE-ACETAMINOPHEN 5-325 MG PO TABS
1.0000 | ORAL_TABLET | Freq: Once | ORAL | Status: AC
  Administered 2011-10-21: 1 via ORAL
  Filled 2011-10-21: qty 1

## 2011-10-21 MED ORDER — TRAMADOL HCL 50 MG PO TABS: 50.0000 mg | ORAL_TABLET | Freq: Four times a day (QID) | ORAL | Status: AC | PRN

## 2011-10-21 NOTE — ED Provider Notes (Signed)
History     CSN: [id number]  Arrival date & time 10/21/11  1728   First MD Initiated Contact with Patient 10/21/11 1804      Chief Complaint  Patient presents with  . Abscess    RT glut.    (Consider location/radiation/quality/duration/timing/severity/associated sxs/prior treatment) HPI  36 year old male with history of abscess presents requesting for I&D procedure for a right buttock abscess. Patient states he has had this abscess for the past several weeks. He has been I&D several times to the same affected area but it keeps returning. Describe the onset was gradual, persistent, sharp and throbbing, worsening with palpation. Denies fever, chills, rash. No history of diabetes.  Past Medical History  Diagnosis Date  . Hypertension   . Renal disorder     Past Surgical History  Procedure Date  . Dg av dialysis  shunt access exist*l* or     No family history on file.  History  Substance Use Topics  . Smoking status: Current Some Day Smoker    Types: Cigarettes  . Smokeless tobacco: Not on file  . Alcohol Use: No      Review of Systems  Constitutional: Negative for fever.  Musculoskeletal: Negative for joint swelling.  Skin: Positive for wound. Negative for rash.  Neurological: Negative for numbness.  All other systems reviewed and are negative.    Allergies  Penicillins  Home Medications   Current Outpatient Rx  Name Route Sig Dispense Refill  . CINACALCET HCL 60 MG PO TABS Oral Take 90 mg by mouth every evening.     Marland Kitchen LANTHANUM CARBONATE 1000 MG PO CHEW Oral Chew 1,000 mg by mouth 2 (two) times daily with a meal.     . RENA-VITE PO TABS Oral Take 1 tablet by mouth daily.      . OXYCODONE-ACETAMINOPHEN 5-325 MG PO TABS Oral Take 1 tablet by mouth every 4 (four) hours as needed. For pain    . SEVELAMER CARBONATE 800 MG PO TABS Oral Take 1,600-3,200 mg by mouth 3 (three) times daily with meals. Takes 3200mg  with meals and 1200mg  with snacks      BP 141/94   Pulse 85  Temp 98.7 F (37.1 C) (Oral)  Resp 16  SpO2 96%  Physical Exam  Nursing note and vitals reviewed. Constitutional: He appears well-developed and well-nourished.       Morbidly obese  Eyes: Conjunctivae are normal.  Neck: Neck supple.  Abdominal: Soft. There is no tenderness.  Neurological: He is alert.  Skin: Skin is warm.     Psychiatric: He has a normal mood and affect.    ED Course  Procedures (including critical care time)  Labs Reviewed - No data to display No results found.   No diagnosis found.  INCISION AND DRAINAGE Performed by: Fayrene Helper Consent: Verbal consent obtained. Risks and benefits: risks, benefits and alternatives were discussed Type: abscess  Body area: R medial buttock  Anesthesia: local infiltration  Local anesthetic: lidocaine 2% w epinephrine  Anesthetic total: 3 ml  Complexity: complex Blunt dissection to break up loculations  Drainage: purulent  Drainage amount: minimal  Packing material: 1/4 in iodoform gauze  Patient tolerance: Patient tolerated the procedure well with no immediate complications.  1. R buttock abscess   MDM  Abscess to R medial buttock with successful I&D.  Care instruction and f/u instruction given.         Fayrene Helper, PA-C 10/21/11 1833  Fayrene Helper, PA-C 10/21/11 1478

## 2011-10-21 NOTE — ED Notes (Signed)
Pt has abscess to R gluteal area. Pt was seen at Urgent Care for same 3 weeks ago and had I&D. Pt states abscess has reoccured to same area.

## 2011-10-21 NOTE — ED Provider Notes (Signed)
Medical screening examination/treatment/procedure(s) were performed by non-physician practitioner and as supervising physician I was immediately available for consultation/collaboration.   Amal Saiki Y. Nereida Schepp, MD 10/21/11 2339 

## 2012-01-12 ENCOUNTER — Other Ambulatory Visit (HOSPITAL_COMMUNITY): Payer: Self-pay | Admitting: Nephrology

## 2012-01-12 DIAGNOSIS — N186 End stage renal disease: Secondary | ICD-10-CM

## 2012-01-15 ENCOUNTER — Other Ambulatory Visit (HOSPITAL_COMMUNITY): Payer: Self-pay | Admitting: Nephrology

## 2012-01-15 ENCOUNTER — Ambulatory Visit (HOSPITAL_COMMUNITY)
Admission: RE | Admit: 2012-01-15 | Discharge: 2012-01-15 | Disposition: A | Discharge status: Home / Self Care | Payer: Medicare Other | Source: Ambulatory Visit | Attending: Nephrology | Admitting: Nephrology

## 2012-01-15 DIAGNOSIS — N186 End stage renal disease: Secondary | ICD-10-CM

## 2012-01-15 DIAGNOSIS — N19 Unspecified kidney failure: Secondary | ICD-10-CM | POA: Insufficient documentation

## 2012-01-15 DIAGNOSIS — Z992 Dependence on renal dialysis: Secondary | ICD-10-CM | POA: Insufficient documentation

## 2012-01-15 DIAGNOSIS — I871 Compression of vein: Secondary | ICD-10-CM | POA: Insufficient documentation

## 2012-01-15 DIAGNOSIS — T82898A Other specified complication of vascular prosthetic devices, implants and grafts, initial encounter: Secondary | ICD-10-CM | POA: Insufficient documentation

## 2012-01-15 DIAGNOSIS — I129 Hypertensive chronic kidney disease with stage 1 through stage 4 chronic kidney disease, or unspecified chronic kidney disease: Secondary | ICD-10-CM | POA: Insufficient documentation

## 2012-01-15 DIAGNOSIS — Y832 Surgical operation with anastomosis, bypass or graft as the cause of abnormal reaction of the patient, or of later complication, without mention of misadventure at the time of the procedure: Secondary | ICD-10-CM | POA: Insufficient documentation

## 2012-01-15 MED ORDER — IOHEXOL 300 MG/ML  SOLN
100.0000 mL | Freq: Once | INTRAMUSCULAR | Status: AC | PRN
  Administered 2012-01-15: 60 mL via INTRAVENOUS

## 2012-01-15 NOTE — H&P (Signed)
Bradley [name] is an 36 y.o. male.   Chief Complaint: Decreased access flow HPI: VEnous stenosis  Past Medical History  Diagnosis Date  . Hypertension   . Renal disorder     Past Surgical History  Procedure Date  . Dg av dialysis  shunt access exist*l* or     No family history on file. Social History:  reports that he has been smoking Cigarettes.  He does not have any smokeless tobacco history on file. He reports that he does not drink alcohol. His drug history not on file.  Allergies:  Allergies  Allergen Reactions  . Penicillins Swelling     (Not in a hospital admission)  No results found for this or any previous visit (from the past 48 hour(s)). No results found.  Review of Systems  Constitutional: Negative for fever and chills.  Cardiovascular: Negative for chest pain.  Skin: Negative for itching and rash.    There were no vitals taken for this visit. Physical Exam  Constitutional: He is oriented to person, place, and time. He appears well-developed and well-nourished.  HENT:  Head: Normocephalic and atraumatic.  Neck: Normal range of motion.  Cardiovascular: Normal rate, regular rhythm and normal heart sounds.   Respiratory: Effort normal and breath sounds normal.  GI: Soft.  Neurological: He is alert and oriented to person, place, and time.  Skin: Skin is warm and dry.  Psychiatric: He has a normal mood and affect.     Assessment/Plan PTA venous  Bradley [name],ART A 01/15/2012, 9:24 AM

## 2012-01-15 NOTE — Procedures (Signed)
PTA venous 6 mm Success No comp

## 2012-03-10 ENCOUNTER — Institutional Professional Consult (permissible substitution): Payer: Medicare Other | Admitting: Pulmonary Disease

## 2012-03-30 ENCOUNTER — Institutional Professional Consult (permissible substitution): Payer: Medicare Other | Admitting: Pulmonary Disease

## 2012-04-08 ENCOUNTER — Other Ambulatory Visit (HOSPITAL_COMMUNITY): Payer: Self-pay | Admitting: Nephrology

## 2012-04-08 DIAGNOSIS — N186 End stage renal disease: Secondary | ICD-10-CM

## 2012-04-13 ENCOUNTER — Ambulatory Visit (HOSPITAL_COMMUNITY)
Admission: RE | Admit: 2012-04-13 | Discharge: 2012-04-13 | Disposition: A | Discharge status: Home / Self Care | Payer: Medicare Other | Source: Ambulatory Visit | Attending: Nephrology | Admitting: Nephrology

## 2012-04-13 DIAGNOSIS — Y832 Surgical operation with anastomosis, bypass or graft as the cause of abnormal reaction of the patient, or of later complication, without mention of misadventure at the time of the procedure: Secondary | ICD-10-CM | POA: Insufficient documentation

## 2012-04-13 DIAGNOSIS — T82898A Other specified complication of vascular prosthetic devices, implants and grafts, initial encounter: Secondary | ICD-10-CM | POA: Insufficient documentation

## 2012-04-13 DIAGNOSIS — N186 End stage renal disease: Secondary | ICD-10-CM

## 2012-04-13 MED ORDER — IOHEXOL 300 MG/ML  SOLN
100.0000 mL | Freq: Once | INTRAMUSCULAR | Status: AC | PRN
  Administered 2012-04-13: 32 mL via INTRAVENOUS

## 2012-04-13 NOTE — Procedures (Signed)
Normal appearance and function of right forearm native AVF without peripheral or central stenosis.  No intervention performed.  No immediate complications.

## 2012-06-11 ENCOUNTER — Emergency Department (HOSPITAL_COMMUNITY)
Admission: EM | Admit: 2012-06-11 | Discharge: 2012-06-11 | Disposition: A | Discharge status: Home / Self Care | Payer: Medicare Other | Attending: Emergency Medicine | Admitting: Emergency Medicine

## 2012-06-11 ENCOUNTER — Encounter (HOSPITAL_COMMUNITY): Payer: Self-pay | Admitting: Emergency Medicine

## 2012-06-11 DIAGNOSIS — I10 Essential (primary) hypertension: Secondary | ICD-10-CM | POA: Insufficient documentation

## 2012-06-11 DIAGNOSIS — F17200 Nicotine dependence, unspecified, uncomplicated: Secondary | ICD-10-CM | POA: Insufficient documentation

## 2012-06-11 DIAGNOSIS — Z79899 Other long term (current) drug therapy: Secondary | ICD-10-CM | POA: Insufficient documentation

## 2012-06-11 DIAGNOSIS — Z87448 Personal history of other diseases of urinary system: Secondary | ICD-10-CM | POA: Insufficient documentation

## 2012-06-11 DIAGNOSIS — L02214 Cutaneous abscess of groin: Secondary | ICD-10-CM

## 2012-06-11 DIAGNOSIS — L02219 Cutaneous abscess of trunk, unspecified: Secondary | ICD-10-CM | POA: Insufficient documentation

## 2012-06-11 DIAGNOSIS — L03319 Cellulitis of trunk, unspecified: Secondary | ICD-10-CM | POA: Insufficient documentation

## 2012-06-11 MED ORDER — CEPHALEXIN 500 MG PO CAPS: 500.0000 mg | ORAL_CAPSULE | Freq: Two times a day (BID) | ORAL | Status: DC

## 2012-06-11 MED ORDER — SULFAMETHOXAZOLE-TRIMETHOPRIM 800-160 MG PO TABS: 1.0000 | ORAL_TABLET | Freq: Two times a day (BID) | ORAL | Status: DC

## 2012-06-11 MED ORDER — HYDROCODONE-ACETAMINOPHEN 5-325 MG PO TABS: 1.0000 | ORAL_TABLET | Freq: Four times a day (QID) | ORAL | Status: DC | PRN

## 2012-06-11 NOTE — ED Notes (Signed)
Patient said he was in the shower two days ago and he felt the knot in his groin.  The patient said he tried heat, and a cream for boils for the pain and nothing helped.  Patient said it got worse instead of better and the pain is so severe that he finally decided to come in to be seen.

## 2012-06-11 NOTE — ED Notes (Signed)
Pt comfortable with d/c instructions

## 2012-06-11 NOTE — ED Provider Notes (Signed)
History    This chart was scribed for non-physician practitioner working with Nelia Shi, MD by Donne Anon, ED Scribe. This patient was seen in room TR05C/TR05C and the patient's care was started at 1727.   CSN: [id number]  Arrival date & time 06/11/12  1659   First MD Initiated Contact with Patient 06/11/12 1727      Chief Complaint  Patient presents with  . Abscess     The history is provided by the patient. No language interpreter was used.   Bradley [name] is a 37 y.o. male who presents to the Emergency Department complaining of gradual onset, constant, gradually worsening moderate abscess to his right groin area which began 2 days ago. He has a h/o abscesses. He reports associated tenderness and pain to the area. He denies fever, chills, vomiting, drainage or any other pain. He has tried hot compresses with no relief.  Past Medical History  Diagnosis Date  . Hypertension   . Renal disorder     Past Surgical History  Procedure Laterality Date  . Dg av dialysis  shunt access exist*l* or      No family history on file.  History  Substance Use Topics  . Smoking status: Current Some Day Smoker    Types: Cigarettes  . Smokeless tobacco: Not on file  . Alcohol Use: No      Review of Systems  Constitutional: Negative for fever and chills.  Gastrointestinal: Negative for vomiting.  Skin: Positive for wound.  All other systems reviewed and are negative.    Allergies  Penicillins  Home Medications   Current Outpatient Rx  Name  Route  Sig  Dispense  Refill  . calcium acetate (PHOSLO) 667 MG capsule   Oral   Take 1,334-3,335 mg by mouth See admin instructions. Take 5 capsules with meals and 2 capsules with a snack         . cinacalcet (SENSIPAR) 60 MG tablet   Oral   Take 90 mg by mouth 2 (two) times daily.          Marland Kitchen lanthanum (FOSRENOL) 1000 MG chewable tablet   Oral   Chew 1,000 mg by mouth 2 (two) times daily with a meal.          .  multivitamin (RENA-VIT) TABS tablet   Oral   Take 1 tablet by mouth daily.           Marland Kitchen oxycodone (OXY-IR) 5 MG capsule   Oral   Take 10 mg by mouth 3 (three) times daily.           BP 153/93  Pulse 106  Temp(Src) 98.8 F (37.1 C) (Oral)  Resp 16  Ht 6' (1.829 m)  Wt 345 lb (156.491 kg)  BMI 46.78 kg/m2  SpO2 97%  Physical Exam  Nursing note and vitals reviewed. Constitutional: He is oriented to person, place, and time. He appears well-developed and well-nourished. No distress.  HENT:  Head: Normocephalic and atraumatic.  Eyes: EOM are normal.  Neck: Neck supple. No tracheal deviation present.  Cardiovascular: Normal rate.   Pulmonary/Chest: Effort normal. No respiratory distress.  Musculoskeletal: Normal range of motion.  Neurological: He is alert and oriented to person, place, and time.  Skin: Skin is warm and dry.  3 cm x 3 cm abscess to right groin. The area is erythematous, tender and fluctuant.  Psychiatric: He has a normal mood and affect. His behavior is normal.    ED Course  Procedures (including critical care time) DIAGNOSTIC STUDIES: Oxygen Saturation is 97% on room air, adequate by my interpretation.    COORDINATION OF CARE: 6:07 PM Discussed treatment plan which includes I&D with pt at bedside and pt agreed to plan.   6:08 PM INCISION AND DRAINAGE Performed by: Donne Anon Consent: Verbal consent obtained. Risks and benefits: risks, benefits and alternatives were discussed Type: abscess  Body area: right groin  Anesthesia: local infiltration  Incision was made with a scalpel.  Local anesthetic: lidocaine 2% w/o epinephrine  Anesthetic total: 2 ml  Complexity: complex Blunt dissection to break up loculations  Drainage: purulent  Drainage amount: large  Packing material: 1/4 in iodoform gauze  Patient tolerance: Patient tolerated the procedure well with no immediate complications.      Labs Reviewed - No data to display No  results found.   1. Abscess of right groin       MDM  Pt with right groin abscess. Otherwise non toxic. I&D performed, packed. Pt tolerated well. Home with norco, antibiotics bactrim and keflex, follow up in two days for recheck.   Filed Vitals:   06/11/12 1706  BP: 153/93  Pulse: 106  Temp: 98.8 F (37.1 C)  Resp: 16    I personally performed the services described in this documentation, which was scribed in my presence. The recorded information has been reviewed and is accurate.      Lottie Mussel, PA-C 06/11/12 1905

## 2012-06-11 NOTE — ED Notes (Signed)
Onset 2 days ago right groin skin bump increasing in size and pain. History of abscesses.

## 2012-06-12 NOTE — ED Provider Notes (Signed)
Medical screening examination/treatment/procedure(s) were performed by non-physician practitioner and as supervising physician I was immediately available for consultation/collaboration.   Yeraldi Fidler L Marcoantonio Legault, MD 06/12/12 1229 

## 2012-07-02 ENCOUNTER — Encounter (HOSPITAL_COMMUNITY): Payer: Self-pay | Admitting: *Deleted

## 2012-07-02 ENCOUNTER — Emergency Department (HOSPITAL_COMMUNITY)
Admission: EM | Admit: 2012-07-02 | Discharge: 2012-07-03 | Disposition: A | Discharge status: Home / Self Care | Payer: Medicare Other | Attending: Emergency Medicine | Admitting: Emergency Medicine

## 2012-07-02 DIAGNOSIS — E875 Hyperkalemia: Secondary | ICD-10-CM | POA: Insufficient documentation

## 2012-07-02 DIAGNOSIS — Z79899 Other long term (current) drug therapy: Secondary | ICD-10-CM | POA: Insufficient documentation

## 2012-07-02 DIAGNOSIS — Y838 Other surgical procedures as the cause of abnormal reaction of the patient, or of later complication, without mention of misadventure at the time of the procedure: Secondary | ICD-10-CM | POA: Insufficient documentation

## 2012-07-02 DIAGNOSIS — Z992 Dependence on renal dialysis: Secondary | ICD-10-CM | POA: Insufficient documentation

## 2012-07-02 DIAGNOSIS — T82598A Other mechanical complication of other cardiac and vascular devices and implants, initial encounter: Secondary | ICD-10-CM | POA: Insufficient documentation

## 2012-07-02 DIAGNOSIS — I120 Hypertensive chronic kidney disease with stage 5 chronic kidney disease or end stage renal disease: Secondary | ICD-10-CM | POA: Insufficient documentation

## 2012-07-02 DIAGNOSIS — T82590A Other mechanical complication of surgically created arteriovenous fistula, initial encounter: Secondary | ICD-10-CM

## 2012-07-02 DIAGNOSIS — N186 End stage renal disease: Secondary | ICD-10-CM | POA: Insufficient documentation

## 2012-07-02 DIAGNOSIS — F17200 Nicotine dependence, unspecified, uncomplicated: Secondary | ICD-10-CM | POA: Insufficient documentation

## 2012-07-02 LAB — CBC WITH DIFFERENTIAL/PLATELET
Basophils Absolute: 0 10*3/uL (ref 0.0–0.1)
Basophils Relative: 0 % (ref 0–1)
Eosinophils Absolute: 0.3 10*3/uL (ref 0.0–0.7)
Eosinophils Relative: 3 % (ref 0–5)
HCT: 51.6 % (ref 39.0–52.0)
MCH: 31.3 pg (ref 26.0–34.0)
MCHC: 35.3 g/dL (ref 30.0–36.0)
MCV: 88.8 fL (ref 78.0–100.0)
Monocytes Absolute: 1.2 10*3/uL — ABNORMAL HIGH (ref 0.1–1.0)
Platelets: 223 10*3/uL (ref 150–400)
RDW: 14 % (ref 11.5–15.5)
WBC: 9.4 10*3/uL (ref 4.0–10.5)

## 2012-07-02 MED ORDER — HYDROCODONE-ACETAMINOPHEN 5-325 MG PO TABS
2.0000 | ORAL_TABLET | Freq: Once | ORAL | Status: AC
  Administered 2012-07-03: 2 via ORAL
  Filled 2012-07-02: qty 2

## 2012-07-02 NOTE — ED Provider Notes (Signed)
History     CSN: [id number]  Arrival date & time 07/02/12  2152   First MD Initiated Contact with Patient 07/02/12 2309      Chief Complaint  Patient presents with  . poss occlusion dialysis fistula     (Consider location/radiation/quality/duration/timing/severity/associated sxs/prior treatment) HPI Bradley [name] is a 37 y.o. male history of end-stage renal disease who went to dialysis normally today, associated flow in the fistula however afterward about 3:00 to 3:30 [place] airport [place] a pain in his right arm. He beginning of it, he was mild to moderate localized well to his AV fistula in the forearm, with no associated with any other symptoms, no redness, swelling.  About 9 to 9:30 this evening he noticed that he didn't have pulsations in the portion of the fistula were he typically has pulsations and he became concerned that it was clotted off. Since then, symptoms have been constant, unchanged no alleviating or exacerbating factors. Denies any fevers, chills, chest pain, shortness of breath, rash, joint aches, myalgias, abdominal pain, nausea vomiting or diarrhea.  Nephrologist is Dr. Carolynn Comment.  Patient did have a narrowing of his fistula and was told he would need further work on it about 2 months ago   Past Medical History  Diagnosis Date  . Hypertension   . Renal disorder     Past Surgical History  Procedure Laterality Date  . Dg av dialysis  shunt access exist*l* or      No family history on file.  History  Substance Use Topics  . Smoking status: Current Some Day Smoker    Types: Cigarettes  . Smokeless tobacco: Not on file  . Alcohol Use: No      Review of Systems At least 10pt or greater review of systems completed and are negative except where specified in the HPI.  Allergies  Penicillins  Home Medications   Current Outpatient Rx  Name  Route  Sig  Dispense  Refill  . calcium acetate (PHOSLO) 667 MG capsule   Oral   Take  1,334-3,335 mg by mouth See admin instructions. Take 5 capsules with meals and 2 capsules with a snack         . cinacalcet (SENSIPAR) 90 MG tablet   Oral   Take 90 mg by mouth 2 (two) times daily.         Marland Kitchen lanthanum (FOSRENOL) 1000 MG chewable tablet   Oral   Chew 1,000 mg by mouth 2 (two) times daily with a meal.          . multivitamin (RENA-VIT) TABS tablet   Oral   Take 1 tablet by mouth daily.           Marland Kitchen oxycodone (OXY-IR) 5 MG capsule   Oral   Take 5-10 mg by mouth 3 (three) times daily as needed for pain. For pain           BP 121/70  Pulse 113  Temp(Src) 98.1 F (36.7 C) (Oral)  Resp 18  SpO2 94%  Physical Exam  Nursing notes reviewed.  Electronic medical record reviewed. VITAL SIGNS:   Filed Vitals:   07/02/12 2157 07/02/12 2236 07/02/12 2300 07/02/12 2330  BP: 153/75 116/69 121/70 114/89  Pulse: 121 111 113 104  Temp: 98.1 F (36.7 C)     TempSrc: Oral     Resp: 16 18    SpO2: 96% 95% 94% 95%   CONSTITUTIONAL: Awake, oriented, appears non-toxic HENT: Atraumatic, normocephalic,  oral mucosa pink and moist, airway patent. Nares patent without drainage. External ears normal. EYES: Conjunctiva clear, EOMI, PERRLA NECK: Trachea midline, non-tender, supple CARDIOVASCULAR: Tachycardic rate, Normal rhythm, No murmurs, rubs, gallops PULMONARY/CHEST: Clear to auscultation, no rhonchi, wheezes, or rales. Symmetrical breath sounds. Non-tender. ABDOMINAL: Non-distended, obese, soft, non-tender - no rebound or guarding.  BS normal. NEUROLOGIC: Non-focal, moving all four extremities, no gross sensory or motor deficits. EXTREMITIES: No clubbing, cyanosis, or edema. Fistula in the right forearm the pulsations felt distally near the distal radius.  There is no redness or swelling, there are 4 distinct buttonhole insertion/needle marks 2 are fresh 2 are rather old. No surrounding cellulitis, draining pus. Area is not exquisitely tender to palpation to SKIN: Warm,  Dry, No erythema, No rash  ED Course  Procedures (including critical care time)  Date: 07/03/2012  Rate: 95  Rhythm: normal sinus rhythm  QRS Axis: normal  Intervals: normal  ST/T Wave abnormalities: normal  Conduction Disutrbances: none  Narrative Interpretation: unremarkable - no Pincock T waves, no significant change when compared with prior dated 09/09/2010    Labs Reviewed  CBC WITH DIFFERENTIAL - Abnormal; Notable for the following:    Hemoglobin 18.2 (*)    Monocytes Relative 13 (*)    Monocytes Absolute 1.2 (*)    All other components within normal limits  CULTURE, BLOOD (SINGLE)  PHOSPHORUS  BASIC METABOLIC PANEL   No results found.   1. Dialysis AV fistula malfunction, initial encounter   2. Hyperkalemia       MDM  Bradley [name] is a 37 y.o. male presents with concern for clotted fistula. The area is not infected, do not think he needs antibiotics prophylactically at this time, we'll obtain some basic labs including CBC blood culture, patient had a normal run of dialysis today Will check his electrolytes in case flow fell off.    Patient will likely need an AV fistulogram, do not think he will need that emergently.  Patient's pain is not out of proportion to exam, do not suspect necrotizing fasciitis in this patient any other deep tissue infection. Patient is tachycardic into the 1 teens and 120s on my physical exam and on nursing records, patient says he typically is tachycardic in the 110s 120s after dialysis. He is otherwise afebrile, nontoxic.  Initial labs show the patient is hyperkalemic with a potassium of 6.6 without visible hemolysis. Treated with insulin, D50, Kayexalate per nephrology. Discussed case with nephrology - Dr. Eliott Nine, since there is no flow in the fistula, we can assume that is likely clotted and will need AV fistulogram and declotting procedure.  Discussed with interventional radiology nurse on call- nephrology will call in the morning and set  up an interventional appointment. I gave nephrologist patient's phone number, date of birth, and also discussed with IR on call.  Recheck of the patient's potassium was 4.8, patient had been given Kayexalate - and some Kayexalate to go home with.  I explained the diagnosis and have given explicit precautions to return to the ER including chest pain, dizziness any other new or worsening symptoms. The patient understands and accepts the medical plan as it's been dictated and I have answered their questions. Discharge instructions concerning home care and prescriptions have been given.  The patient is STABLE and is discharged to home in good condition.          Jones Skene, MD 07/03/12 [phone]

## 2012-07-02 NOTE — ED Notes (Signed)
Patient requesting pain medication, md notified

## 2012-07-02 NOTE — ED Notes (Signed)
The pts rt dialysis graft rt forearm  Feels strange.  He was diaklyzed earlier today and the arm hurt when the rn stuck his fistula.  Now he has pulses but no thrill can be felt

## 2012-07-02 NOTE — ED Notes (Signed)
Patient sitting on stretcher at this time. Complaining of pain in right forearm where fistula is. Patient did have dialysis today with no difficulty. States his pain began a few hours afterwards and wanted to have it checked out. Also states he is unable to feel the thrill in the right wrist. Plan of care discussed with patient, awaiting MD orders. Call light at bedside. Will continue to monitor.

## 2012-07-03 ENCOUNTER — Other Ambulatory Visit (HOSPITAL_COMMUNITY): Payer: Self-pay | Admitting: Nephrology

## 2012-07-03 DIAGNOSIS — N186 End stage renal disease: Secondary | ICD-10-CM

## 2012-07-03 DIAGNOSIS — Z992 Dependence on renal dialysis: Secondary | ICD-10-CM

## 2012-07-03 LAB — BASIC METABOLIC PANEL
CO2: 24 mEq/L (ref 19–32)
Calcium: 10.8 mg/dL — ABNORMAL HIGH (ref 8.4–10.5)
GFR calc non Af Amer: 5 mL/min — ABNORMAL LOW (ref >90)
Glucose, Bld: 103 mg/dL — ABNORMAL HIGH (ref 70–99)
Potassium: 6.6 mEq/L (ref 3.5–5.1)
Sodium: 130 mEq/L — ABNORMAL LOW (ref 135–145)

## 2012-07-03 LAB — POCT I-STAT, CHEM 8
BUN: 39 mg/dL — ABNORMAL HIGH (ref 6–23)
Calcium, Ion: 1.21 mmol/L (ref 1.12–1.23)
HCT: 53 % — ABNORMAL HIGH (ref 39.0–52.0)
Hemoglobin: 18 g/dL — ABNORMAL HIGH (ref 13.0–17.0)
Sodium: 136 mEq/L (ref 135–145)
TCO2: 24 mmol/L (ref 0–100)

## 2012-07-03 LAB — PHOSPHORUS: Phosphorus: 8 mg/dL — ABNORMAL HIGH (ref 2.3–4.6)

## 2012-07-03 MED ORDER — INSULIN ASPART 100 UNIT/ML ~~LOC~~ SOLN
10.0000 [IU] | Freq: Once | SUBCUTANEOUS | Status: AC
  Administered 2012-07-03: 10 [IU] via INTRAVENOUS
  Filled 2012-07-03: qty 1

## 2012-07-03 MED ORDER — SODIUM CHLORIDE 0.9 % IV SOLN
1.0000 g | Freq: Once | INTRAVENOUS | Status: AC
  Administered 2012-07-03: 1 g via INTRAVENOUS
  Filled 2012-07-03: qty 10

## 2012-07-03 MED ORDER — SODIUM POLYSTYRENE SULFONATE 15 GM/60ML PO SUSP
30.0000 g | Freq: Once | ORAL | Status: AC
  Administered 2012-07-03: 30 g via ORAL
  Filled 2012-07-03: qty 60

## 2012-07-03 MED ORDER — CALCIUM GLUCONATE 10 % IV SOLN: 1.0000 g | Freq: Once | INTRAVENOUS | Status: DC

## 2012-07-03 MED ORDER — DEXTROSE 50 % IV SOLN
50.0000 mL | Freq: Once | INTRAVENOUS | Status: AC
  Administered 2012-07-03: 50 mL via INTRAVENOUS
  Filled 2012-07-03: qty 50

## 2012-07-03 MED ORDER — SODIUM POLYSTYRENE SULFONATE 15 GM/60ML PO SUSP
30.0000 g | Freq: Once | ORAL | Status: AC
  Administered 2012-07-03: 30 g via ORAL
  Filled 2012-07-03: qty 120

## 2012-07-03 NOTE — ED Notes (Signed)
Lab in room to obtain blood

## 2012-07-03 NOTE — ED Notes (Signed)
Notified by lab that pt has potassium of 6.6, nonhemolyzed.  RN made aware of value.

## 2012-07-03 NOTE — ED Notes (Signed)
Patient ambulating to restroom without difficulty.

## 2012-07-03 NOTE — Discharge Instructions (Signed)
Dialysis (AV) Shunt, Malfunction There is a malfunction or blockage in the shunt that is used to carry out your dialysis treatments. The shunt is an access (an entrance or a "way in" to your blood vessels) for the dialysis machine. This access can be made in one of several ways:  Joining an artery to a vein under your skin to make a bigger blood vessel called a "fistula".  Joining an artery to a vein under your skin using a soft tube called a "graft".  Placing a soft tube (called a "catheter") that is placed in a large vein, usually in your neck. CAUSES   Infection is a common cause of a shunt not working.  A blood clot could have formed inside a part of the fistula, graft, or catheter. This could completely or partly block the flow of blood.  There may have been a kink in the graft or catheter.  A collection of blood (called a "hematoma" or a bruise) next to the graft or catheter could push against the graft or catheter. This could completely or partly block the flow of blood. SYMPTOMS  Evidence of shunt malfunction may include:  During a routine check of your fistula, graft, or catheter at home, you noticed either a change in the expected "vibration" or pulse, or you found that this "vibration" or pulse was gone.  There is new or unusual swelling of the area (such as your arm) around the access.  There was an unsuccessful puncture of your access by the dialysis team.  During routine dialysis, your dialysis team found that the flow of blood through the fistula, graft, or catheter was too slow for effective dialysis.  When routine dialysis was completed and the needle was removed, your dialysis team found that bleeding lasted for too long a time. DIAGNOSIS  Your caregiver may order laboratory (blood) work, cultures, and a special X-ray test in order to learn what may be wrong with your shunt. This test involves the injection of a special liquid into the shunt that can be seen on X-ray.  This helps your caregiver see if there really is a blockage in the shunt. If seen, the blockage can then be treated.  TREATMENT   If infection is found in the shunt, your caregiver may prescribe antibiotic medication to control the infection.  If a clot is found in the shunt, you will likely be referred to your surgeon. Several methods can be used to remove the clot so that your shunt is working properly and can still be used for your regular dialysis.  If a blockage in the shunt is found to be due to some other cause (such as a kink in a graft), then you will likely be referred to your surgeon. The surgeon could discuss methods to unblock the shunt, or there may be discussion about having the shunt replaced.  If you receive a referral to a surgeon or other physician, it is important that you follow up exactly as instructed. Any delay in follow up could cause permanent dysfunction of the shunt which may be dangerous. SEEK MEDICAL CARE IF:   Fever develops.  Swelling and pain around the shunt gets worse or new pain develops.  Unusual bleeding develops either at the location of the shunt or from any other area.  Pain, numbness, or an unusual pale skin color develops in the hand on the side of your shunt.  Dizziness or weakness develops that you have not had before.  Shortness of  breath develops.  Chest pain develops. Document Released: 02/10/2006 Document Revised: 06/02/2011 Document Reviewed: 04/01/2010 [facility] Patient Information 2013 [place], [place].  Hyperkalemia Hyperkalemia means you have too much potassium in your blood. Potassium is a type of salt in the blood (electrolyte). Normally, your kidneys remove potassium from the body. Too much potassium can be life-threatening. HOME CARE  Only take medicine as told by your doctor.  Do not take vitamins or natural products unless your doctor says they are okay.  Keep all doctor visits as told.  Follow diet instructions as told  by your doctor. GET HELP RIGHT AWAY IF:  Your heartbeat is not regular or very slow.  You feel dizzy (lightheaded).  You feel weak.  You are short of breath.  You have chest pain.  You pass out (faint). MAKE SURE YOU:   Understand these instructions.  Will watch your condition.  Will get help right away if you are not doing well or get worse. Document Released: 03/10/2005 Document Revised: 06/02/2011 Document Reviewed: 08/28/2010 [facility] Patient Information 2013 [place], [place].

## 2012-07-03 NOTE — ED Notes (Signed)
Patient given discharge instructions for malfunction of fistula. No rx was provided. Advised to follow up with kidney doctor. Patient voiced understanding of all instructions and had no further questions. Patient ambulated to front lobby without difficulty.

## 2012-07-05 ENCOUNTER — Encounter (HOSPITAL_COMMUNITY): Payer: Self-pay

## 2012-07-05 ENCOUNTER — Ambulatory Visit (HOSPITAL_COMMUNITY)
Admission: RE | Admit: 2012-07-05 | Discharge: 2012-07-05 | Disposition: A | Discharge status: Home / Self Care | Payer: Medicare Other | Source: Ambulatory Visit | Attending: Nephrology | Admitting: Nephrology

## 2012-07-05 ENCOUNTER — Other Ambulatory Visit (HOSPITAL_COMMUNITY): Payer: Self-pay | Admitting: Nephrology

## 2012-07-05 VITALS — BP 152/100 | HR 86 | Temp 98.7°F | Resp 16

## 2012-07-05 DIAGNOSIS — I742 Embolism and thrombosis of arteries of the upper extremities: Secondary | ICD-10-CM | POA: Insufficient documentation

## 2012-07-05 DIAGNOSIS — Y832 Surgical operation with anastomosis, bypass or graft as the cause of abnormal reaction of the patient, or of later complication, without mention of misadventure at the time of the procedure: Secondary | ICD-10-CM | POA: Insufficient documentation

## 2012-07-05 DIAGNOSIS — Z880 Allergy status to penicillin: Secondary | ICD-10-CM | POA: Insufficient documentation

## 2012-07-05 DIAGNOSIS — N186 End stage renal disease: Secondary | ICD-10-CM

## 2012-07-05 DIAGNOSIS — I82609 Acute embolism and thrombosis of unspecified veins of unspecified upper extremity: Secondary | ICD-10-CM | POA: Insufficient documentation

## 2012-07-05 DIAGNOSIS — I120 Hypertensive chronic kidney disease with stage 5 chronic kidney disease or end stage renal disease: Secondary | ICD-10-CM | POA: Insufficient documentation

## 2012-07-05 DIAGNOSIS — F17200 Nicotine dependence, unspecified, uncomplicated: Secondary | ICD-10-CM | POA: Insufficient documentation

## 2012-07-05 DIAGNOSIS — I82C19 Acute embolism and thrombosis of unspecified internal jugular vein: Secondary | ICD-10-CM | POA: Insufficient documentation

## 2012-07-05 DIAGNOSIS — T82898A Other specified complication of vascular prosthetic devices, implants and grafts, initial encounter: Secondary | ICD-10-CM | POA: Insufficient documentation

## 2012-07-05 LAB — POTASSIUM: Potassium: 5.2 mEq/L — ABNORMAL HIGH (ref 3.5–5.1)

## 2012-07-05 MED ORDER — HEPARIN SODIUM (PORCINE) 1000 UNIT/ML IJ SOLN
INTRAMUSCULAR | Status: AC
  Filled 2012-07-05: qty 1

## 2012-07-05 MED ORDER — HYDROMORPHONE HCL PF 1 MG/ML IJ SOLN
INTRAMUSCULAR | Status: AC
  Filled 2012-07-05: qty 1

## 2012-07-05 MED ORDER — OXYCODONE-ACETAMINOPHEN 5-325 MG PO TABS
ORAL_TABLET | ORAL | Status: AC
  Administered 2012-07-05: 2
  Filled 2012-07-05: qty 2

## 2012-07-05 MED ORDER — FENTANYL CITRATE 0.05 MG/ML IJ SOLN
INTRAMUSCULAR | Status: AC | PRN
  Administered 2012-07-05: 50 ug via INTRAVENOUS
  Administered 2012-07-05: 25 ug via INTRAVENOUS
  Administered 2012-07-05: 50 ug via INTRAVENOUS
  Administered 2012-07-05 (×11): 25 ug via INTRAVENOUS

## 2012-07-05 MED ORDER — MIDAZOLAM HCL 2 MG/2ML IJ SOLN
INTRAMUSCULAR | Status: AC
  Filled 2012-07-05: qty 4

## 2012-07-05 MED ORDER — HEPARIN SODIUM (PORCINE) 1000 UNIT/ML IJ SOLN
INTRAMUSCULAR | Status: AC | PRN
  Administered 2012-07-05: 3000 [IU] via INTRAVENOUS
  Administered 2012-07-05: 2000 [IU] via INTRAVENOUS

## 2012-07-05 MED ORDER — HYDROMORPHONE HCL PF 1 MG/ML IJ SOLN
INTRAMUSCULAR | Status: AC | PRN
  Administered 2012-07-05: 1 mg

## 2012-07-05 MED ORDER — HYDROCODONE-ACETAMINOPHEN 5-325 MG PO TABS: 2.0000 | ORAL_TABLET | Freq: Four times a day (QID) | ORAL | Status: DC | PRN

## 2012-07-05 MED ORDER — FENTANYL CITRATE 0.05 MG/ML IJ SOLN
INTRAMUSCULAR | Status: AC
  Filled 2012-07-05: qty 2

## 2012-07-05 MED ORDER — MIDAZOLAM HCL 2 MG/2ML IJ SOLN
INTRAMUSCULAR | Status: AC
  Filled 2012-07-05: qty 2

## 2012-07-05 MED ORDER — FENTANYL CITRATE 0.05 MG/ML IJ SOLN
INTRAMUSCULAR | Status: AC
  Filled 2012-07-05: qty 4

## 2012-07-05 MED ORDER — ALTEPLASE 100 MG IV SOLR
2.0000 mg | Freq: Once | INTRAVENOUS | Status: AC
  Administered 2012-07-05: 2 mg
  Filled 2012-07-05: qty 2

## 2012-07-05 MED ORDER — SODIUM POLYSTYRENE SULFONATE 15 GM/60ML PO SUSP
15.0000 g | Freq: Once | ORAL | Status: DC
  Filled 2012-07-05: qty 60

## 2012-07-05 MED ORDER — MIDAZOLAM HCL 2 MG/2ML IJ SOLN
INTRAMUSCULAR | Status: AC | PRN
  Administered 2012-07-05: 0.5 mg via INTRAVENOUS
  Administered 2012-07-05: 1 mg via INTRAVENOUS
  Administered 2012-07-05: 0.5 mg via INTRAVENOUS
  Administered 2012-07-05 (×2): 1 mg via INTRAVENOUS
  Administered 2012-07-05 (×2): 0.5 mg via INTRAVENOUS
  Administered 2012-07-05 (×3): 1 mg via INTRAVENOUS
  Administered 2012-07-05 (×2): 0.5 mg via INTRAVENOUS
  Administered 2012-07-05: 1 mg via INTRAVENOUS

## 2012-07-05 MED ORDER — VANCOMYCIN HCL 10 G IV SOLR
1500.0000 mg | Freq: Once | INTRAVENOUS | Status: AC
  Administered 2012-07-05: 1500 mg via INTRAVENOUS
  Filled 2012-07-05: qty 1500

## 2012-07-05 MED ORDER — IOHEXOL 300 MG/ML  SOLN
100.0000 mL | Freq: Once | INTRAMUSCULAR | Status: AC | PRN
  Administered 2012-07-05: 90 mL via INTRAVENOUS

## 2012-07-05 MED ORDER — VANCOMYCIN HCL IN DEXTROSE 1-5 GM/200ML-% IV SOLN: 1000.0000 mg | Freq: Once | INTRAVENOUS | Status: DC

## 2012-07-05 NOTE — Procedures (Signed)
Declot RFA fistula (attempted) Venous PTA Arterial PTA Tunneled HD cath placement L IJ No ptx. No complication No blood loss. See complete dictation in [facility].

## 2012-07-05 NOTE — Progress Notes (Signed)
D. Zafeng notified of K 5.2, orders rec'd instructed pt to go to HD in am at 6:30

## 2012-07-05 NOTE — H&P (Signed)
Bradley [name] is an 37 y.o. male.   Chief Complaint: Rt arm dialysis fistula clotted Last use 4/11: good flow Clotted that evening Last intervention: angioplasty 12/2011 Now scheduled for thrombolysis and possible angioplasty/stent placement. Possible dialysis catheter placement if needed HPI: HTN; ESRD; smoker  Past Medical History  Diagnosis Date  . Hypertension   . Renal disorder     Past Surgical History  Procedure Laterality Date  . Dg av dialysis  shunt access exist*l* or      No family history on file. Social History:  reports that he has been smoking Cigarettes.  He has been smoking about 0.00 packs per day. He does not have any smokeless tobacco history on file. He reports that he does not drink alcohol. His drug history is not on file.  Allergies:  Allergies  Allergen Reactions  . Penicillins Anaphylaxis     (Not in a hospital admission)  No results found for this or any previous visit (from the past 48 hour(s)). No results found.  Review of Systems  Constitutional: Negative for fever.  HENT: Negative for neck pain.   Respiratory: Negative for shortness of breath.   Cardiovascular: Negative for chest pain.  Gastrointestinal: Negative for nausea, vomiting, abdominal pain and diarrhea.  Musculoskeletal: Negative for back pain.  Neurological: Negative for weakness and headaches.  Psychiatric/Behavioral: The patient is not nervous/anxious.     Blood pressure 139/89, pulse 99, temperature 98.7 F (37.1 C), temperature source Oral, resp. rate 16, SpO2 99.00%. Physical Exam  Constitutional: He is oriented to person, place, and time. He appears well-developed.  Cardiovascular: Normal rate, regular rhythm and normal heart sounds.   No murmur heard. Respiratory: Effort normal and breath sounds normal. He has no wheezes.  GI: Soft. Bowel sounds are normal. He exhibits distension. There is no tenderness.  Musculoskeletal: Normal range of motion.  Rt arm fistula  no pulse; no thrill  Neurological: He is alert and oriented to person, place, and time. Coordination normal.  Skin: Skin is warm.  Psychiatric: He has a normal mood and affect. His behavior is normal. Judgment and thought content normal.     Assessment/Plan Clotted rt arm dialysis fistula Scheduled for thrombolysis and poss pta/stent  Poss dialysis catheter if needed Pt aware of procedure benefits and risks and agreeable to proceed cosent signed and in chart  Naja Apperson A 07/05/2012, 11:24 AM

## 2012-07-05 NOTE — ED Notes (Signed)
Declot unsuccessful, changing room to temp cath

## 2012-07-06 ENCOUNTER — Telehealth: Payer: Self-pay | Admitting: Vascular Surgery

## 2012-07-06 ENCOUNTER — Other Ambulatory Visit: Payer: Self-pay

## 2012-07-06 DIAGNOSIS — N186 End stage renal disease: Secondary | ICD-10-CM

## 2012-07-06 NOTE — Telephone Encounter (Signed)
notified patient of ov with dr. early as per judy's message 07-06-12

## 2012-07-07 ENCOUNTER — Other Ambulatory Visit (HOSPITAL_COMMUNITY): Payer: Self-pay | Admitting: Nephrology

## 2012-07-07 ENCOUNTER — Encounter (HOSPITAL_COMMUNITY): Payer: Self-pay

## 2012-07-07 ENCOUNTER — Ambulatory Visit (HOSPITAL_COMMUNITY)
Admission: RE | Admit: 2012-07-07 | Discharge: 2012-07-07 | Disposition: A | Discharge status: Home / Self Care | Payer: Medicare Other | Source: Ambulatory Visit | Attending: Nephrology | Admitting: Nephrology

## 2012-07-07 DIAGNOSIS — N186 End stage renal disease: Secondary | ICD-10-CM

## 2012-07-07 DIAGNOSIS — T82898A Other specified complication of vascular prosthetic devices, implants and grafts, initial encounter: Secondary | ICD-10-CM | POA: Insufficient documentation

## 2012-07-07 DIAGNOSIS — Z880 Allergy status to penicillin: Secondary | ICD-10-CM | POA: Insufficient documentation

## 2012-07-07 DIAGNOSIS — F17200 Nicotine dependence, unspecified, uncomplicated: Secondary | ICD-10-CM | POA: Insufficient documentation

## 2012-07-07 DIAGNOSIS — I120 Hypertensive chronic kidney disease with stage 5 chronic kidney disease or end stage renal disease: Secondary | ICD-10-CM | POA: Insufficient documentation

## 2012-07-07 DIAGNOSIS — Y831 Surgical operation with implant of artificial internal device as the cause of abnormal reaction of the patient, or of later complication, without mention of misadventure at the time of the procedure: Secondary | ICD-10-CM | POA: Insufficient documentation

## 2012-07-07 MED ORDER — IOHEXOL 300 MG/ML  SOLN
50.0000 mL | Freq: Once | INTRAMUSCULAR | Status: AC | PRN
  Administered 2012-07-07: 1 mL via INTRAVENOUS

## 2012-07-07 MED ORDER — CHLORHEXIDINE GLUCONATE 4 % EX LIQD
CUTANEOUS | Status: AC
  Filled 2012-07-07: qty 30

## 2012-07-07 MED ORDER — HEPARIN SODIUM (PORCINE) 1000 UNIT/ML IJ SOLN
INTRAMUSCULAR | Status: AC
  Filled 2012-07-07: qty 1

## 2012-07-07 MED ORDER — VANCOMYCIN HCL 10 G IV SOLR
1500.0000 mg | Freq: Once | INTRAVENOUS | Status: AC
  Administered 2012-07-07: 1500 mg via INTRAVENOUS
  Filled 2012-07-07: qty 1500

## 2012-07-07 MED ORDER — SODIUM CHLORIDE 0.9 % IV SOLN: Freq: Once | INTRAVENOUS | Status: DC

## 2012-07-07 NOTE — H&P (Signed)
Bradley [name] is an 37 y.o. male.   Chief Complaint: hemodialysis catheter placed 07/05/12 Dialysis 1 time; but catheter has pulled back and needs exchanged Scheduled now for same HPI: HTN; ESRD; smoker  Past Medical History  Diagnosis Date  . Hypertension   . Renal disorder     Past Surgical History  Procedure Laterality Date  . Dg av dialysis  shunt access exist*l* or      No family history on file. Social History:  reports that he has been smoking Cigarettes.  He has been smoking about 0.00 packs per day. He does not have any smokeless tobacco history on file. He reports that he does not drink alcohol. His drug history is not on file.  Allergies:  Allergies  Allergen Reactions  . Penicillins Anaphylaxis     (Not in a hospital admission)  No results found for this or any previous visit (from the past 48 hour(s)). Ir Pta Periph Right Non Cer Caro,renal,visc,iliac Low Ext  07/05/2012  *RADIOLOGY REPORT*  Clinical Data:Occluded right forearm hemodialysis fistula.  Recent attempts to create new access buttons.  Mild tenderness and redness overlying the level of the fistula at the wrist.  The more central aspect of the outflow vein however is nontender.  DIALYSIS GRAFT DECLOT (ATTEMPTED) VENOUS ANGIOPLASTY ARTERIAL ANGIOPLASTY ULTRASOUND GUIDANCE FOR VASCULAR ACCESS X2 TUNNELED HEMODIALYSIS CATHETER PLACEMENT WITH ULTRASOUND AND FLUOROSCOPIC GUIDANCE  Comparison: 04/13/2012  Technique: The procedure, risks, benefits, and alternatives were explained to the patient.  Questions regarding the procedure were encouraged and answered.  The patient understands and consents to the procedure.  Intravenous Fentanyl and Versed were administered as conscious sedation during continuous cardiorespiratory monitoring by the radiology RN, with a total moderate sedation time of 150 minutes.  The outflow vein in the forearm just central to the anastomosis was accessed antegrade with 21-gauge micropuncture  needle under real- time ultrasonic guidance after the overlying skin prepped with Betadine, draped in usual sterile fashion, infiltrated locally with 1% lidocaine.  Needle exchanged over 018 guidewire for transitional dilator through which 2 mg t-PA was administered.  Ultrasound images were stored. Through the antegrade dilator, a Bentson wire was advanced centrally.  Over this a 61F sheath was placed, through which a 5 [place] Kumpe catheter was advanced for central venography. This showed patency of the central venous system above the elbow through the SVC. 3000 units heparin were administered IV. The Kumpe was exchanged over guidewire for the AngioJet device, used to mechanically thrombolyse the outflow vein and remove the thrombus from the native outflow.  In similar fashion, the outflow vein more centrally was accessed retrograde under ultrasound with a micropuncture needle, exchanged for a transitional dilator.  A 6-French vascular sheath was placed. The AngioJet device was used to clear thrombus from the more peripheral aspect of the outflow vein.  The Fogarty catheter   was advanced over a Bentson wire across the arterial anastomosis and used to dislodge the platelet plug from the arterial anastomosis, using several passes. The patient had discomfort   with this maneuver.  There was poor antegrade flow.  The outflow vein was dilated throughout its length from just beyond the anastomosis to the level of the subclavian vein using overlapping inflations with a 6 mm x 4 cm Conquest angioplasty balloon. The patient had discomfort with  dilatation of the vein segment in the forearm.  A 5 mm x 2 cm Mustang angioplasty balloon was used to dilate the arterial anastomosis.  Still there was  inadequate antegrade flow. There is some thrombus evident in the outflow vein in the forearm. Further dilatation of the outflow vein in the forearm with a 7 mm balloon was performed.  There is little improvement in the antegrade flow.  No evidence of venous extravasation or rupture. Study across the level of the fistula showed poor antegrade flow to the graft. For this reason, the arterial anastomosis was again dilated with a 5 mm balloon and the Fogarty catheter used to clear any residual thrombus from the graft.  However, there is poor antegrade flow and recurrence of thrombus throughout the outflow vein graft despite continued adequate heparinization.  Occlusion of the primary venous outflow in the upper arm was encountered, ultimately cleared with the 6 mm balloon.  However, during this manipulation there was further clot formation in the outflow vein in the forearm, and this responded inadequately to mechanical thrombolysis and angioplasty.  At this point it was clear that we were making no for progressive and our armamentarium of peripheral instrumentation was inadequate to successfully declot the fistula and the length of   outflow vein. The catheter and sheaths were then removed and hemostasis achieved with 2-0 Ethilon sutures.  For this reason, the patient required      placement of a tunneled catheter for dialysis access.  Ultrasound demonstrated the right IJ vein appears occluded.  As antibiotic prophylaxis, vancomycin 1 gram was ordered pre- procedure and administered intravenously within one hour of incision.  Patency of the left IJ vein was confirmed with ultrasound with image documentation. An appropriate skin site was determined. Region was prepped using maximum barrier technique including cap and mask, sterile gown, sterile gloves, large sterile sheet, and Chlorhexidine   as cutaneous antisepsis. The region was infiltrated locally with 1% lidocaine. Under real-time ultrasound guidance, the left IJ vein was accessed with a 21 gauge micropuncture needle; the needle tip within the vein was confirmed with ultrasound image documentation.   Needle exchanged over the 018 guidewire for transitional dilator, which allowed advancement of a  Benson wire into the IVC. Over this, an MPA catheter was advanced. A Hemosplit 23 hemodialysis catheter was tunneled from the left anterior chest wall approach to the left IJ dermatotomy site. The MPA catheter was exchanged over an Amplatz wire for serial vascular dilators which allow placement of a peel-away sheath, through which the catheter was advanced under intermittent fluoroscopy, positioned with its tips in the proximal and midright atrium. Spot chest radiograph confirms good catheter position. No pneumothorax. Catheter was flushed and primed per protocol. Catheter secured externally with O Prolene sutures. The left IJ   dermatotomy site was closed with Dermabond. No immediate complication.  IMPRESSION:  1. Attempted declot of right forearm hemodialysis fistula, ultimately unsuccessful. Atypical tenderness at the anastomosis and in the forearm segment of the outflow vein suggests possible thrombophlebitis. 2. Technically successful 6 and 7 mm balloon angioplasty of venous outflow from the fistula. 3.  5 mm balloon angioplasty of the arterial anastomosis of the fistula. 4.  Technically successful placement of tunneled left IJ hemodialysis catheter with ultrasound and fluoroscopic guidance. Ready for routine use.  Access management: Needs surgical   assessment of the right forearm fistula, probable revision or replacement.   Original Report Authenticated By: D. Andria Rhein, MD    Ir Pta Venous Right  07/05/2012  *RADIOLOGY REPORT*  Clinical Data:Occluded right forearm hemodialysis fistula.  Recent attempts to create new access buttons.  Mild tenderness and redness overlying the level of the  fistula at the wrist.  The more central aspect of the outflow vein however is nontender.  DIALYSIS GRAFT DECLOT (ATTEMPTED) VENOUS ANGIOPLASTY ARTERIAL ANGIOPLASTY ULTRASOUND GUIDANCE FOR VASCULAR ACCESS X2 TUNNELED HEMODIALYSIS CATHETER PLACEMENT WITH ULTRASOUND AND FLUOROSCOPIC GUIDANCE  Comparison: 04/13/2012   Technique: The procedure, risks, benefits, and alternatives were explained to the patient.  Questions regarding the procedure were encouraged and answered.  The patient understands and consents to the procedure.  Intravenous Fentanyl and Versed were administered as conscious sedation during continuous cardiorespiratory monitoring by the radiology RN, with a total moderate sedation time of 150 minutes.  The outflow vein in the forearm just central to the anastomosis was accessed antegrade with 21-gauge micropuncture needle under real- time ultrasonic guidance after the overlying skin prepped with Betadine, draped in usual sterile fashion, infiltrated locally with 1% lidocaine.  Needle exchanged over 018 guidewire for transitional dilator through which 2 mg t-PA was administered.  Ultrasound images were stored. Through the antegrade dilator, a Bentson wire was advanced centrally.  Over this a 59F sheath was placed, through which a 5 [place] Kumpe catheter was advanced for central venography. This showed patency of the central venous system above the elbow through the SVC. 3000 units heparin were administered IV. The Kumpe was exchanged over guidewire for the AngioJet device, used to mechanically thrombolyse the outflow vein and remove the thrombus from the native outflow.  In similar fashion, the outflow vein more centrally was accessed retrograde under ultrasound with a micropuncture needle, exchanged for a transitional dilator.  A 6-French vascular sheath was placed. The AngioJet device was used to clear thrombus from the more peripheral aspect of the outflow vein.  The Fogarty catheter   was advanced over a Bentson wire across the arterial anastomosis and used to dislodge the platelet plug from the arterial anastomosis, using several passes. The patient had discomfort   with this maneuver.  There was poor antegrade flow.  The outflow vein was dilated throughout its length from just beyond the anastomosis to the level  of the subclavian vein using overlapping inflations with a 6 mm x 4 cm Conquest angioplasty balloon. The patient had discomfort with  dilatation of the vein segment in the forearm.  A 5 mm x 2 cm Mustang angioplasty balloon was used to dilate the arterial anastomosis.  Still there was inadequate antegrade flow. There is some thrombus evident in the outflow vein in the forearm. Further dilatation of the outflow vein in the forearm with a 7 mm balloon was performed.  There is little improvement in the antegrade flow. No evidence of venous extravasation or rupture. Study across the level of the fistula showed poor antegrade flow to the graft. For this reason, the arterial anastomosis was again dilated with a 5 mm balloon and the Fogarty catheter used to clear any residual thrombus from the graft.  However, there is poor antegrade flow and recurrence of thrombus throughout the outflow vein graft despite continued adequate heparinization.  Occlusion of the primary venous outflow in the upper arm was encountered, ultimately cleared with the 6 mm balloon.  However, during this manipulation there was further clot formation in the outflow vein in the forearm, and this responded inadequately to mechanical thrombolysis and angioplasty.  At this point it was clear that we were making no for progressive and our armamentarium of peripheral instrumentation was inadequate to successfully declot the fistula and the length of   outflow vein. The catheter and sheaths were then removed and hemostasis achieved  with 2-0 Ethilon sutures.  For this reason, the patient required      placement of a tunneled catheter for dialysis access.  Ultrasound demonstrated the right IJ vein appears occluded.  As antibiotic prophylaxis, vancomycin 1 gram was ordered pre- procedure and administered intravenously within one hour of incision.  Patency of the left IJ vein was confirmed with ultrasound with image documentation. An appropriate skin site was  determined. Region was prepped using maximum barrier technique including cap and mask, sterile gown, sterile gloves, large sterile sheet, and Chlorhexidine   as cutaneous antisepsis. The region was infiltrated locally with 1% lidocaine. Under real-time ultrasound guidance, the left IJ vein was accessed with a 21 gauge micropuncture needle; the needle tip within the vein was confirmed with ultrasound image documentation.   Needle exchanged over the 018 guidewire for transitional dilator, which allowed advancement of a Benson wire into the IVC. Over this, an MPA catheter was advanced. A Hemosplit 23 hemodialysis catheter was tunneled from the left anterior chest wall approach to the left IJ dermatotomy site. The MPA catheter was exchanged over an Amplatz wire for serial vascular dilators which allow placement of a peel-away sheath, through which the catheter was advanced under intermittent fluoroscopy, positioned with its tips in the proximal and midright atrium. Spot chest radiograph confirms good catheter position. No pneumothorax. Catheter was flushed and primed per protocol. Catheter secured externally with O Prolene sutures. The left IJ   dermatotomy site was closed with Dermabond. No immediate complication.  IMPRESSION:  1. Attempted declot of right forearm hemodialysis fistula, ultimately unsuccessful. Atypical tenderness at the anastomosis and in the forearm segment of the outflow vein suggests possible thrombophlebitis. 2. Technically successful 6 and 7 mm balloon angioplasty of venous outflow from the fistula. 3.  5 mm balloon angioplasty of the arterial anastomosis of the fistula. 4.  Technically successful placement of tunneled left IJ hemodialysis catheter with ultrasound and fluoroscopic guidance. Ready for routine use.  Access management: Needs surgical   assessment of the right forearm fistula, probable revision or replacement.   Original Report Authenticated By: D. Andria Rhein, MD    Ir Fluoro Guide  Cv Line Left  07/05/2012  *RADIOLOGY REPORT*  Clinical Data:Occluded right forearm hemodialysis fistula.  Recent attempts to create new access buttons.  Mild tenderness and redness overlying the level of the fistula at the wrist.  The more central aspect of the outflow vein however is nontender.  DIALYSIS GRAFT DECLOT (ATTEMPTED) VENOUS ANGIOPLASTY ARTERIAL ANGIOPLASTY ULTRASOUND GUIDANCE FOR VASCULAR ACCESS X2 TUNNELED HEMODIALYSIS CATHETER PLACEMENT WITH ULTRASOUND AND FLUOROSCOPIC GUIDANCE  Comparison: 04/13/2012  Technique: The procedure, risks, benefits, and alternatives were explained to the patient.  Questions regarding the procedure were encouraged and answered.  The patient understands and consents to the procedure.  Intravenous Fentanyl and Versed were administered as conscious sedation during continuous cardiorespiratory monitoring by the radiology RN, with a total moderate sedation time of 150 minutes.  The outflow vein in the forearm just central to the anastomosis was accessed antegrade with 21-gauge micropuncture needle under real- time ultrasonic guidance after the overlying skin prepped with Betadine, draped in usual sterile fashion, infiltrated locally with 1% lidocaine.  Needle exchanged over 018 guidewire for transitional dilator through which 2 mg t-PA was administered.  Ultrasound images were stored. Through the antegrade dilator, a Bentson wire was advanced centrally.  Over this a 16F sheath was placed, through which a 5 [place] Kumpe catheter was advanced for central venography. This showed patency of  the central venous system above the elbow through the SVC. 3000 units heparin were administered IV. The Kumpe was exchanged over guidewire for the AngioJet device, used to mechanically thrombolyse the outflow vein and remove the thrombus from the native outflow.  In similar fashion, the outflow vein more centrally was accessed retrograde under ultrasound with a micropuncture needle, exchanged for  a transitional dilator.  A 6-French vascular sheath was placed. The AngioJet device was used to clear thrombus from the more peripheral aspect of the outflow vein.  The Fogarty catheter   was advanced over a Bentson wire across the arterial anastomosis and used to dislodge the platelet plug from the arterial anastomosis, using several passes. The patient had discomfort   with this maneuver.  There was poor antegrade flow.  The outflow vein was dilated throughout its length from just beyond the anastomosis to the level of the subclavian vein using overlapping inflations with a 6 mm x 4 cm Conquest angioplasty balloon. The patient had discomfort with  dilatation of the vein segment in the forearm.  A 5 mm x 2 cm Mustang angioplasty balloon was used to dilate the arterial anastomosis.  Still there was inadequate antegrade flow. There is some thrombus evident in the outflow vein in the forearm. Further dilatation of the outflow vein in the forearm with a 7 mm balloon was performed.  There is little improvement in the antegrade flow. No evidence of venous extravasation or rupture. Study across the level of the fistula showed poor antegrade flow to the graft. For this reason, the arterial anastomosis was again dilated with a 5 mm balloon and the Fogarty catheter used to clear any residual thrombus from the graft.  However, there is poor antegrade flow and recurrence of thrombus throughout the outflow vein graft despite continued adequate heparinization.  Occlusion of the primary venous outflow in the upper arm was encountered, ultimately cleared with the 6 mm balloon.  However, during this manipulation there was further clot formation in the outflow vein in the forearm, and this responded inadequately to mechanical thrombolysis and angioplasty.  At this point it was clear that we were making no for progressive and our armamentarium of peripheral instrumentation was inadequate to successfully declot the fistula and the  length of   outflow vein. The catheter and sheaths were then removed and hemostasis achieved with 2-0 Ethilon sutures.  For this reason, the patient required      placement of a tunneled catheter for dialysis access.  Ultrasound demonstrated the right IJ vein appears occluded.  As antibiotic prophylaxis, vancomycin 1 gram was ordered pre- procedure and administered intravenously within one hour of incision.  Patency of the left IJ vein was confirmed with ultrasound with image documentation. An appropriate skin site was determined. Region was prepped using maximum barrier technique including cap and mask, sterile gown, sterile gloves, large sterile sheet, and Chlorhexidine   as cutaneous antisepsis. The region was infiltrated locally with 1% lidocaine. Under real-time ultrasound guidance, the left IJ vein was accessed with a 21 gauge micropuncture needle; the needle tip within the vein was confirmed with ultrasound image documentation.   Needle exchanged over the 018 guidewire for transitional dilator, which allowed advancement of a Benson wire into the IVC. Over this, an MPA catheter was advanced. A Hemosplit 23 hemodialysis catheter was tunneled from the left anterior chest wall approach to the left IJ dermatotomy site. The MPA catheter was exchanged over an Amplatz wire for serial vascular dilators which allow placement  of a peel-away sheath, through which the catheter was advanced under intermittent fluoroscopy, positioned with its tips in the proximal and midright atrium. Spot chest radiograph confirms good catheter position. No pneumothorax. Catheter was flushed and primed per protocol. Catheter secured externally with O Prolene sutures. The left IJ   dermatotomy site was closed with Dermabond. No immediate complication.  IMPRESSION:  1. Attempted declot of right forearm hemodialysis fistula, ultimately unsuccessful. Atypical tenderness at the anastomosis and in the forearm segment of the outflow vein suggests  possible thrombophlebitis. 2. Technically successful 6 and 7 mm balloon angioplasty of venous outflow from the fistula. 3.  5 mm balloon angioplasty of the arterial anastomosis of the fistula. 4.  Technically successful placement of tunneled left IJ hemodialysis catheter with ultrasound and fluoroscopic guidance. Ready for routine use.  Access management: Needs surgical   assessment of the right forearm fistula, probable revision or replacement.   Original Report Authenticated By: D. Andria Rhein, MD    Ir Angio Av Shunt Addl Access  07/05/2012  *RADIOLOGY REPORT*  Clinical Data:Occluded right forearm hemodialysis fistula.  Recent attempts to create new access buttons.  Mild tenderness and redness overlying the level of the fistula at the wrist.  The more central aspect of the outflow vein however is nontender.  DIALYSIS GRAFT DECLOT (ATTEMPTED) VENOUS ANGIOPLASTY ARTERIAL ANGIOPLASTY ULTRASOUND GUIDANCE FOR VASCULAR ACCESS X2 TUNNELED HEMODIALYSIS CATHETER PLACEMENT WITH ULTRASOUND AND FLUOROSCOPIC GUIDANCE  Comparison: 04/13/2012  Technique: The procedure, risks, benefits, and alternatives were explained to the patient.  Questions regarding the procedure were encouraged and answered.  The patient understands and consents to the procedure.  Intravenous Fentanyl and Versed were administered as conscious sedation during continuous cardiorespiratory monitoring by the radiology RN, with a total moderate sedation time of 150 minutes.  The outflow vein in the forearm just central to the anastomosis was accessed antegrade with 21-gauge micropuncture needle under real- time ultrasonic guidance after the overlying skin prepped with Betadine, draped in usual sterile fashion, infiltrated locally with 1% lidocaine.  Needle exchanged over 018 guidewire for transitional dilator through which 2 mg t-PA was administered.  Ultrasound images were stored. Through the antegrade dilator, a Bentson wire was advanced centrally.  Over  this a 47F sheath was placed, through which a 5 [place] Kumpe catheter was advanced for central venography. This showed patency of the central venous system above the elbow through the SVC. 3000 units heparin were administered IV. The Kumpe was exchanged over guidewire for the AngioJet device, used to mechanically thrombolyse the outflow vein and remove the thrombus from the native outflow.  In similar fashion, the outflow vein more centrally was accessed retrograde under ultrasound with a micropuncture needle, exchanged for a transitional dilator.  A 6-French vascular sheath was placed. The AngioJet device was used to clear thrombus from the more peripheral aspect of the outflow vein.  The Fogarty catheter   was advanced over a Bentson wire across the arterial anastomosis and used to dislodge the platelet plug from the arterial anastomosis, using several passes. The patient had discomfort   with this maneuver.  There was poor antegrade flow.  The outflow vein was dilated throughout its length from just beyond the anastomosis to the level of the subclavian vein using overlapping inflations with a 6 mm x 4 cm Conquest angioplasty balloon. The patient had discomfort with  dilatation of the vein segment in the forearm.  A 5 mm x 2 cm Mustang angioplasty balloon was used to dilate the arterial  anastomosis.  Still there was inadequate antegrade flow. There is some thrombus evident in the outflow vein in the forearm. Further dilatation of the outflow vein in the forearm with a 7 mm balloon was performed.  There is little improvement in the antegrade flow. No evidence of venous extravasation or rupture. Study across the level of the fistula showed poor antegrade flow to the graft. For this reason, the arterial anastomosis was again dilated with a 5 mm balloon and the Fogarty catheter used to clear any residual thrombus from the graft.  However, there is poor antegrade flow and recurrence of thrombus throughout the outflow  vein graft despite continued adequate heparinization.  Occlusion of the primary venous outflow in the upper arm was encountered, ultimately cleared with the 6 mm balloon.  However, during this manipulation there was further clot formation in the outflow vein in the forearm, and this responded inadequately to mechanical thrombolysis and angioplasty.  At this point it was clear that we were making no for progressive and our armamentarium of peripheral instrumentation was inadequate to successfully declot the fistula and the length of   outflow vein. The catheter and sheaths were then removed and hemostasis achieved with 2-0 Ethilon sutures.  For this reason, the patient required      placement of a tunneled catheter for dialysis access.  Ultrasound demonstrated the right IJ vein appears occluded.  As antibiotic prophylaxis, vancomycin 1 gram was ordered pre- procedure and administered intravenously within one hour of incision.  Patency of the left IJ vein was confirmed with ultrasound with image documentation. An appropriate skin site was determined. Region was prepped using maximum barrier technique including cap and mask, sterile gown, sterile gloves, large sterile sheet, and Chlorhexidine   as cutaneous antisepsis. The region was infiltrated locally with 1% lidocaine. Under real-time ultrasound guidance, the left IJ vein was accessed with a 21 gauge micropuncture needle; the needle tip within the vein was confirmed with ultrasound image documentation.   Needle exchanged over the 018 guidewire for transitional dilator, which allowed advancement of a Benson wire into the IVC. Over this, an MPA catheter was advanced. A Hemosplit 23 hemodialysis catheter was tunneled from the left anterior chest wall approach to the left IJ dermatotomy site. The MPA catheter was exchanged over an Amplatz wire for serial vascular dilators which allow placement of a peel-away sheath, through which the catheter was advanced under  intermittent fluoroscopy, positioned with its tips in the proximal and midright atrium. Spot chest radiograph confirms good catheter position. No pneumothorax. Catheter was flushed and primed per protocol. Catheter secured externally with O Prolene sutures. The left IJ   dermatotomy site was closed with Dermabond. No immediate complication.  IMPRESSION:  1. Attempted declot of right forearm hemodialysis fistula, ultimately unsuccessful. Atypical tenderness at the anastomosis and in the forearm segment of the outflow vein suggests possible thrombophlebitis. 2. Technically successful 6 and 7 mm balloon angioplasty of venous outflow from the fistula. 3.  5 mm balloon angioplasty of the arterial anastomosis of the fistula. 4.  Technically successful placement of tunneled left IJ hemodialysis catheter with ultrasound and fluoroscopic guidance. Ready for routine use.  Access management: Needs surgical   assessment of the right forearm fistula, probable revision or replacement.   Original Report Authenticated By: D. Andria Rhein, MD    Ir US Guide Vasc Access Left  07/05/2012  *RADIOLOGY REPORT*  Clinical Data:Occluded right forearm hemodialysis fistula.  Recent attempts to create new access buttons.  Mild tenderness  and redness overlying the level of the fistula at the wrist.  The more central aspect of the outflow vein however is nontender.  DIALYSIS GRAFT DECLOT (ATTEMPTED) VENOUS ANGIOPLASTY ARTERIAL ANGIOPLASTY ULTRASOUND GUIDANCE FOR VASCULAR ACCESS X2 TUNNELED HEMODIALYSIS CATHETER PLACEMENT WITH ULTRASOUND AND FLUOROSCOPIC GUIDANCE  Comparison: 04/13/2012  Technique: The procedure, risks, benefits, and alternatives were explained to the patient.  Questions regarding the procedure were encouraged and answered.  The patient understands and consents to the procedure.  Intravenous Fentanyl and Versed were administered as conscious sedation during continuous cardiorespiratory monitoring by the radiology RN, with a  total moderate sedation time of 150 minutes.  The outflow vein in the forearm just central to the anastomosis was accessed antegrade with 21-gauge micropuncture needle under real- time ultrasonic guidance after the overlying skin prepped with Betadine, draped in usual sterile fashion, infiltrated locally with 1% lidocaine.  Needle exchanged over 018 guidewire for transitional dilator through which 2 mg t-PA was administered.  Ultrasound images were stored. Through the antegrade dilator, a Bentson wire was advanced centrally.  Over this a 65F sheath was placed, through which a 5 [place] Kumpe catheter was advanced for central venography. This showed patency of the central venous system above the elbow through the SVC. 3000 units heparin were administered IV. The Kumpe was exchanged over guidewire for the AngioJet device, used to mechanically thrombolyse the outflow vein and remove the thrombus from the native outflow.  In similar fashion, the outflow vein more centrally was accessed retrograde under ultrasound with a micropuncture needle, exchanged for a transitional dilator.  A 6-French vascular sheath was placed. The AngioJet device was used to clear thrombus from the more peripheral aspect of the outflow vein.  The Fogarty catheter   was advanced over a Bentson wire across the arterial anastomosis and used to dislodge the platelet plug from the arterial anastomosis, using several passes. The patient had discomfort   with this maneuver.  There was poor antegrade flow.  The outflow vein was dilated throughout its length from just beyond the anastomosis to the level of the subclavian vein using overlapping inflations with a 6 mm x 4 cm Conquest angioplasty balloon. The patient had discomfort with  dilatation of the vein segment in the forearm.  A 5 mm x 2 cm Mustang angioplasty balloon was used to dilate the arterial anastomosis.  Still there was inadequate antegrade flow. There is some thrombus evident in the outflow  vein in the forearm. Further dilatation of the outflow vein in the forearm with a 7 mm balloon was performed.  There is little improvement in the antegrade flow. No evidence of venous extravasation or rupture. Study across the level of the fistula showed poor antegrade flow to the graft. For this reason, the arterial anastomosis was again dilated with a 5 mm balloon and the Fogarty catheter used to clear any residual thrombus from the graft.  However, there is poor antegrade flow and recurrence of thrombus throughout the outflow vein graft despite continued adequate heparinization.  Occlusion of the primary venous outflow in the upper arm was encountered, ultimately cleared with the 6 mm balloon.  However, during this manipulation there was further clot formation in the outflow vein in the forearm, and this responded inadequately to mechanical thrombolysis and angioplasty.  At this point it was clear that we were making no for progressive and our armamentarium of peripheral instrumentation was inadequate to successfully declot the fistula and the length of   outflow vein. The catheter and  sheaths were then removed and hemostasis achieved with 2-0 Ethilon sutures.  For this reason, the patient required      placement of a tunneled catheter for dialysis access.  Ultrasound demonstrated the right IJ vein appears occluded.  As antibiotic prophylaxis, vancomycin 1 gram was ordered pre- procedure and administered intravenously within one hour of incision.  Patency of the left IJ vein was confirmed with ultrasound with image documentation. An appropriate skin site was determined. Region was prepped using maximum barrier technique including cap and mask, sterile gown, sterile gloves, large sterile sheet, and Chlorhexidine   as cutaneous antisepsis. The region was infiltrated locally with 1% lidocaine. Under real-time ultrasound guidance, the left IJ vein was accessed with a 21 gauge micropuncture needle; the needle tip  within the vein was confirmed with ultrasound image documentation.   Needle exchanged over the 018 guidewire for transitional dilator, which allowed advancement of a Benson wire into the IVC. Over this, an MPA catheter was advanced. A Hemosplit 23 hemodialysis catheter was tunneled from the left anterior chest wall approach to the left IJ dermatotomy site. The MPA catheter was exchanged over an Amplatz wire for serial vascular dilators which allow placement of a peel-away sheath, through which the catheter was advanced under intermittent fluoroscopy, positioned with its tips in the proximal and midright atrium. Spot chest radiograph confirms good catheter position. No pneumothorax. Catheter was flushed and primed per protocol. Catheter secured externally with O Prolene sutures. The left IJ   dermatotomy site was closed with Dermabond. No immediate complication.  IMPRESSION:  1. Attempted declot of right forearm hemodialysis fistula, ultimately unsuccessful. Atypical tenderness at the anastomosis and in the forearm segment of the outflow vein suggests possible thrombophlebitis. 2. Technically successful 6 and 7 mm balloon angioplasty of venous outflow from the fistula. 3.  5 mm balloon angioplasty of the arterial anastomosis of the fistula. 4.  Technically successful placement of tunneled left IJ hemodialysis catheter with ultrasound and fluoroscopic guidance. Ready for routine use.  Access management: Needs surgical   assessment of the right forearm fistula, probable revision or replacement.   Original Report Authenticated By: D. Andria Rhein, MD    Ir US Guide Vasc Access Right  07/05/2012  *RADIOLOGY REPORT*  Clinical Data:Occluded right forearm hemodialysis fistula.  Recent attempts to create new access buttons.  Mild tenderness and redness overlying the level of the fistula at the wrist.  The more central aspect of the outflow vein however is nontender.  DIALYSIS GRAFT DECLOT (ATTEMPTED) VENOUS ANGIOPLASTY  ARTERIAL ANGIOPLASTY ULTRASOUND GUIDANCE FOR VASCULAR ACCESS X2 TUNNELED HEMODIALYSIS CATHETER PLACEMENT WITH ULTRASOUND AND FLUOROSCOPIC GUIDANCE  Comparison: 04/13/2012  Technique: The procedure, risks, benefits, and alternatives were explained to the patient.  Questions regarding the procedure were encouraged and answered.  The patient understands and consents to the procedure.  Intravenous Fentanyl and Versed were administered as conscious sedation during continuous cardiorespiratory monitoring by the radiology RN, with a total moderate sedation time of 150 minutes.  The outflow vein in the forearm just central to the anastomosis was accessed antegrade with 21-gauge micropuncture needle under real- time ultrasonic guidance after the overlying skin prepped with Betadine, draped in usual sterile fashion, infiltrated locally with 1% lidocaine.  Needle exchanged over 018 guidewire for transitional dilator through which 2 mg t-PA was administered.  Ultrasound images were stored. Through the antegrade dilator, a Bentson wire was advanced centrally.  Over this a 51F sheath was placed, through which a 5 [place] Kumpe catheter was advanced  for central venography. This showed patency of the central venous system above the elbow through the SVC. 3000 units heparin were administered IV. The Kumpe was exchanged over guidewire for the AngioJet device, used to mechanically thrombolyse the outflow vein and remove the thrombus from the native outflow.  In similar fashion, the outflow vein more centrally was accessed retrograde under ultrasound with a micropuncture needle, exchanged for a transitional dilator.  A 6-French vascular sheath was placed. The AngioJet device was used to clear thrombus from the more peripheral aspect of the outflow vein.  The Fogarty catheter   was advanced over a Bentson wire across the arterial anastomosis and used to dislodge the platelet plug from the arterial anastomosis, using several passes. The  patient had discomfort   with this maneuver.  There was poor antegrade flow.  The outflow vein was dilated throughout its length from just beyond the anastomosis to the level of the subclavian vein using overlapping inflations with a 6 mm x 4 cm Conquest angioplasty balloon. The patient had discomfort with  dilatation of the vein segment in the forearm.  A 5 mm x 2 cm Mustang angioplasty balloon was used to dilate the arterial anastomosis.  Still there was inadequate antegrade flow. There is some thrombus evident in the outflow vein in the forearm. Further dilatation of the outflow vein in the forearm with a 7 mm balloon was performed.  There is little improvement in the antegrade flow. No evidence of venous extravasation or rupture. Study across the level of the fistula showed poor antegrade flow to the graft. For this reason, the arterial anastomosis was again dilated with a 5 mm balloon and the Fogarty catheter used to clear any residual thrombus from the graft.  However, there is poor antegrade flow and recurrence of thrombus throughout the outflow vein graft despite continued adequate heparinization.  Occlusion of the primary venous outflow in the upper arm was encountered, ultimately cleared with the 6 mm balloon.  However, during this manipulation there was further clot formation in the outflow vein in the forearm, and this responded inadequately to mechanical thrombolysis and angioplasty.  At this point it was clear that we were making no for progressive and our armamentarium of peripheral instrumentation was inadequate to successfully declot the fistula and the length of   outflow vein. The catheter and sheaths were then removed and hemostasis achieved with 2-0 Ethilon sutures.  For this reason, the patient required      placement of a tunneled catheter for dialysis access.  Ultrasound demonstrated the right IJ vein appears occluded.  As antibiotic prophylaxis, vancomycin 1 gram was ordered pre- procedure  and administered intravenously within one hour of incision.  Patency of the left IJ vein was confirmed with ultrasound with image documentation. An appropriate skin site was determined. Region was prepped using maximum barrier technique including cap and mask, sterile gown, sterile gloves, large sterile sheet, and Chlorhexidine   as cutaneous antisepsis. The region was infiltrated locally with 1% lidocaine. Under real-time ultrasound guidance, the left IJ vein was accessed with a 21 gauge micropuncture needle; the needle tip within the vein was confirmed with ultrasound image documentation.   Needle exchanged over the 018 guidewire for transitional dilator, which allowed advancement of a Benson wire into the IVC. Over this, an MPA catheter was advanced. A Hemosplit 23 hemodialysis catheter was tunneled from the left anterior chest wall approach to the left IJ dermatotomy site. The MPA catheter was exchanged over an Amplatz wire  for serial vascular dilators which allow placement of a peel-away sheath, through which the catheter was advanced under intermittent fluoroscopy, positioned with its tips in the proximal and midright atrium. Spot chest radiograph confirms good catheter position. No pneumothorax. Catheter was flushed and primed per protocol. Catheter secured externally with O Prolene sutures. The left IJ   dermatotomy site was closed with Dermabond. No immediate complication.  IMPRESSION:  1. Attempted declot of right forearm hemodialysis fistula, ultimately unsuccessful. Atypical tenderness at the anastomosis and in the forearm segment of the outflow vein suggests possible thrombophlebitis. 2. Technically successful 6 and 7 mm balloon angioplasty of venous outflow from the fistula. 3.  5 mm balloon angioplasty of the arterial anastomosis of the fistula. 4.  Technically successful placement of tunneled left IJ hemodialysis catheter with ultrasound and fluoroscopic guidance. Ready for routine use.  Access  management: Needs surgical   assessment of the right forearm fistula, probable revision or replacement.   Original Report Authenticated By: D. Andria Rhein, MD    Ir Declot Right Mod Sed  07/05/2012  *RADIOLOGY REPORT*  Clinical Data:Occluded right forearm hemodialysis fistula.  Recent attempts to create new access buttons.  Mild tenderness and redness overlying the level of the fistula at the wrist.  The more central aspect of the outflow vein however is nontender.  DIALYSIS GRAFT DECLOT (ATTEMPTED) VENOUS ANGIOPLASTY ARTERIAL ANGIOPLASTY ULTRASOUND GUIDANCE FOR VASCULAR ACCESS X2 TUNNELED HEMODIALYSIS CATHETER PLACEMENT WITH ULTRASOUND AND FLUOROSCOPIC GUIDANCE  Comparison: 04/13/2012  Technique: The procedure, risks, benefits, and alternatives were explained to the patient.  Questions regarding the procedure were encouraged and answered.  The patient understands and consents to the procedure.  Intravenous Fentanyl and Versed were administered as conscious sedation during continuous cardiorespiratory monitoring by the radiology RN, with a total moderate sedation time of 150 minutes.  The outflow vein in the forearm just central to the anastomosis was accessed antegrade with 21-gauge micropuncture needle under real- time ultrasonic guidance after the overlying skin prepped with Betadine, draped in usual sterile fashion, infiltrated locally with 1% lidocaine.  Needle exchanged over 018 guidewire for transitional dilator through which 2 mg t-PA was administered.  Ultrasound images were stored. Through the antegrade dilator, a Bentson wire was advanced centrally.  Over this a 86F sheath was placed, through which a 5 [place] Kumpe catheter was advanced for central venography. This showed patency of the central venous system above the elbow through the SVC. 3000 units heparin were administered IV. The Kumpe was exchanged over guidewire for the AngioJet device, used to mechanically thrombolyse the outflow vein and remove  the thrombus from the native outflow.  In similar fashion, the outflow vein more centrally was accessed retrograde under ultrasound with a micropuncture needle, exchanged for a transitional dilator.  A 6-French vascular sheath was placed. The AngioJet device was used to clear thrombus from the more peripheral aspect of the outflow vein.  The Fogarty catheter   was advanced over a Bentson wire across the arterial anastomosis and used to dislodge the platelet plug from the arterial anastomosis, using several passes. The patient had discomfort   with this maneuver.  There was poor antegrade flow.  The outflow vein was dilated throughout its length from just beyond the anastomosis to the level of the subclavian vein using overlapping inflations with a 6 mm x 4 cm Conquest angioplasty balloon. The patient had discomfort with  dilatation of the vein segment in the forearm.  A 5 mm x 2 cm Mustang angioplasty balloon  was used to dilate the arterial anastomosis.  Still there was inadequate antegrade flow. There is some thrombus evident in the outflow vein in the forearm. Further dilatation of the outflow vein in the forearm with a 7 mm balloon was performed.  There is little improvement in the antegrade flow. No evidence of venous extravasation or rupture. Study across the level of the fistula showed poor antegrade flow to the graft. For this reason, the arterial anastomosis was again dilated with a 5 mm balloon and the Fogarty catheter used to clear any residual thrombus from the graft.  However, there is poor antegrade flow and recurrence of thrombus throughout the outflow vein graft despite continued adequate heparinization.  Occlusion of the primary venous outflow in the upper arm was encountered, ultimately cleared with the 6 mm balloon.  However, during this manipulation there was further clot formation in the outflow vein in the forearm, and this responded inadequately to mechanical thrombolysis and angioplasty.  At  this point it was clear that we were making no for progressive and our armamentarium of peripheral instrumentation was inadequate to successfully declot the fistula and the length of   outflow vein. The catheter and sheaths were then removed and hemostasis achieved with 2-0 Ethilon sutures.  For this reason, the patient required      placement of a tunneled catheter for dialysis access.  Ultrasound demonstrated the right IJ vein appears occluded.  As antibiotic prophylaxis, vancomycin 1 gram was ordered pre- procedure and administered intravenously within one hour of incision.  Patency of the left IJ vein was confirmed with ultrasound with image documentation. An appropriate skin site was determined. Region was prepped using maximum barrier technique including cap and mask, sterile gown, sterile gloves, large sterile sheet, and Chlorhexidine   as cutaneous antisepsis. The region was infiltrated locally with 1% lidocaine. Under real-time ultrasound guidance, the left IJ vein was accessed with a 21 gauge micropuncture needle; the needle tip within the vein was confirmed with ultrasound image documentation.   Needle exchanged over the 018 guidewire for transitional dilator, which allowed advancement of a Benson wire into the IVC. Over this, an MPA catheter was advanced. A Hemosplit 23 hemodialysis catheter was tunneled from the left anterior chest wall approach to the left IJ dermatotomy site. The MPA catheter was exchanged over an Amplatz wire for serial vascular dilators which allow placement of a peel-away sheath, through which the catheter was advanced under intermittent fluoroscopy, positioned with its tips in the proximal and midright atrium. Spot chest radiograph confirms good catheter position. No pneumothorax. Catheter was flushed and primed per protocol. Catheter secured externally with O Prolene sutures. The left IJ   dermatotomy site was closed with Dermabond. No immediate complication.  IMPRESSION:  1.  Attempted declot of right forearm hemodialysis fistula, ultimately unsuccessful. Atypical tenderness at the anastomosis and in the forearm segment of the outflow vein suggests possible thrombophlebitis. 2. Technically successful 6 and 7 mm balloon angioplasty of venous outflow from the fistula. 3.  5 mm balloon angioplasty of the arterial anastomosis of the fistula. 4.  Technically successful placement of tunneled left IJ hemodialysis catheter with ultrasound and fluoroscopic guidance. Ready for routine use.  Access management: Needs surgical   assessment of the right forearm fistula, probable revision or replacement.   Original Report Authenticated By: D. Andria Rhein, MD     Review of Systems  Constitutional: Negative for fever.  Respiratory: Negative for shortness of breath.   Cardiovascular: Negative for chest pain.  Gastrointestinal: Negative for nausea and vomiting.  Neurological: Negative for weakness and headaches.    There were no vitals taken for this visit. Physical Exam  Constitutional: He is oriented to person, place, and time. He appears well-developed.  Cardiovascular: Normal rate, regular rhythm and normal heart sounds.   No murmur heard. Respiratory: Effort normal and breath sounds normal. He has no wheezes.  GI: Soft. He exhibits distension. There is no tenderness.  Musculoskeletal: Normal range of motion.  Neurological: He is alert and oriented to person, place, and time.  Skin: Skin is warm.  Psychiatric: He has a normal mood and affect. His behavior is normal. Judgment and thought content normal.     Assessment/Plan Dialysis catheter placed 07/05/12 Pulled back- needs exchanged Pt aware of procedure benefits and risks and agreeable to proceed Consent signed and in chart  Jaymason Ledesma A 07/07/2012, 12:42 PM

## 2012-07-07 NOTE — Procedures (Signed)
Post-Procedure Note  Pre-operative Diagnosis: ESRD and non-functioning dialysis catheter      Post-operative Diagnosis: same   Indications:   Dialysis catheter not working  Procedure Details:   Consent: Informed consent was obtained. Left jugular catheter was exchanged over a stiff Glide wire.  27 cm tip-to-cuff Equistream catheter placed and tip in right atrium.    Findings: Old catheter came back into upper SVC and not aspirating well.  New catheter in right atrium.  Complications: None.     Condition: Stable  Plan: Catheter is ready for dialysis.

## 2012-07-09 ENCOUNTER — Other Ambulatory Visit (HOSPITAL_COMMUNITY): Payer: Self-pay | Admitting: Nephrology

## 2012-07-09 DIAGNOSIS — N186 End stage renal disease: Secondary | ICD-10-CM

## 2012-07-09 LAB — CULTURE, BLOOD (SINGLE)

## 2012-07-10 ENCOUNTER — Other Ambulatory Visit (HOSPITAL_COMMUNITY): Payer: Self-pay | Admitting: Nephrology

## 2012-07-10 ENCOUNTER — Ambulatory Visit (HOSPITAL_COMMUNITY)
Admission: RE | Admit: 2012-07-10 | Discharge: 2012-07-10 | Disposition: A | Discharge status: Home / Self Care | Payer: Medicare Other | Source: Ambulatory Visit | Attending: Interventional Radiology | Admitting: Interventional Radiology

## 2012-07-10 ENCOUNTER — Ambulatory Visit (HOSPITAL_COMMUNITY)
Admission: RE | Admit: 2012-07-10 | Discharge: 2012-07-10 | Disposition: A | Discharge status: Home / Self Care | Payer: Medicare Other | Source: Ambulatory Visit | Attending: Nephrology | Admitting: Nephrology

## 2012-07-10 DIAGNOSIS — N186 End stage renal disease: Secondary | ICD-10-CM

## 2012-07-10 DIAGNOSIS — I120 Hypertensive chronic kidney disease with stage 5 chronic kidney disease or end stage renal disease: Secondary | ICD-10-CM | POA: Insufficient documentation

## 2012-07-10 DIAGNOSIS — Z79899 Other long term (current) drug therapy: Secondary | ICD-10-CM | POA: Insufficient documentation

## 2012-07-10 DIAGNOSIS — Y841 Kidney dialysis as the cause of abnormal reaction of the patient, or of later complication, without mention of misadventure at the time of the procedure: Secondary | ICD-10-CM | POA: Insufficient documentation

## 2012-07-10 DIAGNOSIS — E669 Obesity, unspecified: Secondary | ICD-10-CM | POA: Insufficient documentation

## 2012-07-10 DIAGNOSIS — I82C29 Chronic embolism and thrombosis of unspecified internal jugular vein: Secondary | ICD-10-CM | POA: Insufficient documentation

## 2012-07-10 DIAGNOSIS — Z880 Allergy status to penicillin: Secondary | ICD-10-CM | POA: Insufficient documentation

## 2012-07-10 DIAGNOSIS — T82898A Other specified complication of vascular prosthetic devices, implants and grafts, initial encounter: Secondary | ICD-10-CM | POA: Insufficient documentation

## 2012-07-10 DIAGNOSIS — F17200 Nicotine dependence, unspecified, uncomplicated: Secondary | ICD-10-CM | POA: Insufficient documentation

## 2012-07-10 MED ORDER — HEPARIN SODIUM (PORCINE) 1000 UNIT/ML IJ SOLN
INTRAMUSCULAR | Status: AC
  Administered 2012-07-10: 4.1 [IU]
  Filled 2012-07-10: qty 1

## 2012-07-10 MED ORDER — VANCOMYCIN HCL IN DEXTROSE 1-5 GM/200ML-% IV SOLN
1000.0000 mg | Freq: Once | INTRAVENOUS | Status: AC
  Administered 2012-07-10: 1000 mg via INTRAVENOUS
  Filled 2012-07-10: qty 200

## 2012-07-10 MED ORDER — MIDAZOLAM HCL 2 MG/2ML IJ SOLN
INTRAMUSCULAR | Status: AC | PRN
  Administered 2012-07-10 (×2): 1 mg via INTRAVENOUS

## 2012-07-10 MED ORDER — IOHEXOL 300 MG/ML  SOLN
50.0000 mL | Freq: Once | INTRAMUSCULAR | Status: AC | PRN
  Administered 2012-07-10: 20 mL via INTRAVENOUS

## 2012-07-10 MED ORDER — CHLORHEXIDINE GLUCONATE 4 % EX LIQD
CUTANEOUS | Status: AC
  Filled 2012-07-10: qty 30

## 2012-07-10 MED ORDER — FENTANYL CITRATE 0.05 MG/ML IJ SOLN
INTRAMUSCULAR | Status: AC
  Filled 2012-07-10: qty 4

## 2012-07-10 MED ORDER — MIDAZOLAM HCL 2 MG/2ML IJ SOLN
INTRAMUSCULAR | Status: AC
  Filled 2012-07-10: qty 4

## 2012-07-10 MED ORDER — FENTANYL CITRATE 0.05 MG/ML IJ SOLN
INTRAMUSCULAR | Status: AC | PRN
  Administered 2012-07-10: 25 ug via INTRAVENOUS
  Administered 2012-07-10: 50 ug via INTRAVENOUS
  Administered 2012-07-10: 25 ug via INTRAVENOUS

## 2012-07-10 NOTE — H&P (Signed)
The left IJ approach catheter has pulled back significantly, likely due to patient body habitus and gravity related changes exerting force of the subcutaneous portion of the tube during changes in patient position.  The catheter tip is now in the upper SVC.  Will attempt placement of a right neck approach catheter if at all possible to provide a straighter course to the upper RA.   Signed,  Sterling Big, MD Vascular & Interventional Radiologist [facility]

## 2012-07-10 NOTE — H&P (Signed)
Bradley [name] is an 37 y.o. male.   Chief Complaint: poorly functioning left chest dialysis catheter HPI: Patient with history of ESRD and decreased flow rates through arterial port of tunneled dialysis catheter presents today for evaluation and possible exchange of catheter.  Past Medical History  Diagnosis Date  . Hypertension   . Renal disorder     Past Surgical History  Procedure Laterality Date  . Dg av dialysis  shunt access exist*l* or      No family history on file. Social History:  reports that he has been smoking Cigarettes.  He has been smoking about 0.00 packs per day. He does not have any smokeless tobacco history on file. He reports that he does not drink alcohol. His drug history is not on file.  Allergies:  Allergies  Allergen Reactions  . Penicillins Anaphylaxis    Current outpatient prescriptions:calcium acetate (PHOSLO) 667 MG capsule, Take 1,334-3,335 mg by mouth See admin instructions. Take 5 capsules with meals and 2 capsules with a snack, Disp: , Rfl: ;  cinacalcet (SENSIPAR) 90 MG tablet, Take 90 mg by mouth 2 (two) times daily., Disp: , Rfl: ;  lanthanum (FOSRENOL) 1000 MG chewable tablet, Chew 1,000 mg by mouth 2 (two) times daily with a meal. , Disp: , Rfl:  multivitamin (RENA-VIT) TABS tablet, Take 1 tablet by mouth daily.  , Disp: , Rfl: ;  oxycodone (OXY-IR) 5 MG capsule, Take 5-10 mg by mouth 3 (three) times daily as needed for pain. For pain, Disp: , Rfl:  Current facility-administered medications:vancomycin (VANCOCIN) IVPB 1000 mg/200 mL premix, 1,000 mg, Intravenous, Once, Malachy Moan, MD  No results found for this or any previous visit (from the past 48 hour(s)). No results found.  Review of Systems  Constitutional: Negative for fever and chills.  Respiratory: Negative for cough and shortness of breath.   Cardiovascular: Negative for chest pain.  Gastrointestinal: Negative for nausea, vomiting and abdominal pain.  Musculoskeletal: Negative  for back pain.       Rt arm pain at site of dialysis fistula  Neurological: Negative for headaches.    Blood pressure 133/86, pulse 85, SpO2 100.00%. Physical Exam  Constitutional: He is oriented to person, place, and time. He appears well-developed and well-nourished.  Cardiovascular: Normal rate and regular rhythm.   Rt arm dialysis fistula with no thrill/bruit; intact left chest tunneled HD cath  Respiratory: Effort normal and breath sounds normal.  GI: Soft. Bowel sounds are normal.  obese  Musculoskeletal: Normal range of motion. He exhibits no edema.  Neurological: He is alert and oriented to person, place, and time.     Assessment/Plan Pt with ESRD and poorly functioning left chest tunneled HD catheter. Plan is for further evaluation/possible exchange of catheter today. Details of above d/w pt with his understanding and consent.  ALLRED,D KEVIN 07/10/2012, 8:30 AM

## 2012-07-10 NOTE — Procedures (Signed)
Interventional Radiology Procedure Note  Procedure:  1.) Injection of existing catheter 2.) Placement of a new right neck approach tunneled HD catheter vis an external jugular collateral. 3.) Removal of right IJ approach tunneled HD catheter.  Complications: None Recommendations: - May resume dialysis - Suture removed in 14 days - Bandage on left tube exit site x 48 hrs  Signed,  Sterling Big, MD Vascular & Interventional Radiologist [facility] Radiology

## 2012-07-12 ENCOUNTER — Ambulatory Visit (HOSPITAL_COMMUNITY): Payer: Medicare Other

## 2012-07-19 ENCOUNTER — Encounter: Payer: Self-pay | Admitting: Vascular Surgery

## 2012-07-20 ENCOUNTER — Ambulatory Visit (INDEPENDENT_AMBULATORY_CARE_PROVIDER_SITE_OTHER): Payer: Medicare Other | Admitting: Vascular Surgery

## 2012-07-20 ENCOUNTER — Encounter (INDEPENDENT_AMBULATORY_CARE_PROVIDER_SITE_OTHER): Payer: Medicare Other | Admitting: Vascular Surgery

## 2012-07-20 ENCOUNTER — Encounter: Payer: Self-pay | Admitting: Vascular Surgery

## 2012-07-20 VITALS — BP 122/85 | HR 90 | Resp 18 | Ht 72.0 in | Wt 350.0 lb

## 2012-07-20 DIAGNOSIS — N186 End stage renal disease: Secondary | ICD-10-CM

## 2012-07-20 DIAGNOSIS — T82598A Other mechanical complication of other cardiac and vascular devices and implants, initial encounter: Secondary | ICD-10-CM

## 2012-07-20 DIAGNOSIS — Z992 Dependence on renal dialysis: Secondary | ICD-10-CM

## 2012-07-20 DIAGNOSIS — Z01810 Encounter for preprocedural cardiovascular examination: Secondary | ICD-10-CM

## 2012-07-20 NOTE — Progress Notes (Signed)
VASCULAR & VEIN SPECIALISTS OF   Referred by:  Cecille Aver, MD [address] [place], [place] [address]  Reason for referral: New access.  He has had previous access AV fistula in left upper arm and most recently right forearm.  The right forearm became non functional 3 weeks ago.  He was seen by IR at [facility] and the fistula failed thrombectomy attemts.  History of Present Illness  Bradley [name] is a 37 y.o. (July 19, 1975) male who presents for evaluation for permanent access.  The patient is right hand dominant.  The patient has had previous access procedures.  Previous central venous cannulation procedures include: left and currently a right IJ catheter..  The patient has not had a  PPM placed.   Past Medical History  Diagnosis Date  . Hypertension   . Renal disorder     Past Surgical History  Procedure Laterality Date  . Dg av dialysis  shunt access exist*l* or    . Appendectomy      History   Social History  . Marital Status: Married    Spouse Name: N/A    Number of Children: N/A  . Years of Education: N/A   Occupational History  . Not on file.   Social History Main Topics  . Smoking status: Current Some Day Smoker    Types: Cigarettes  . Smokeless tobacco: Never Used  . Alcohol Use: No  . Drug Use: No  . Sexually Active: Not on file   Other Topics Concern  . Not on file   Social History Narrative  . No narrative on file    Family History  Problem Relation Age of Onset  . Hypertension Mother   . Peripheral vascular disease Mother   . Diabetes Father   . Hyperlipidemia Father   . Hypertension Father   . Peripheral vascular disease Maternal Grandmother       Current Outpatient Prescriptions on File Prior to Visit  Medication Sig Dispense Refill  . calcium acetate (PHOSLO) 667 MG capsule Take 1,334-3,335 mg by mouth See admin instructions. Take 5 capsules with meals and 2 capsules with a snack      . cinacalcet (SENSIPAR) 90 MG tablet  Take 90 mg by mouth 2 (two) times daily.      Marland Kitchen lanthanum (FOSRENOL) 1000 MG chewable tablet Chew 1,000 mg by mouth 2 (two) times daily with a meal.       . multivitamin (RENA-VIT) TABS tablet Take 1 tablet by mouth daily.        Marland Kitchen oxycodone (OXY-IR) 5 MG capsule Take 5-10 mg by mouth 3 (three) times daily as needed for pain. For pain       No current facility-administered medications on file prior to visit.    Allergies  Allergen Reactions  . Penicillins Anaphylaxis    REVIEW OF SYSTEMS:  (Positives checked otherwise negative)  CARDIOVASCULAR:  []  chest pain, []  chest pressure, []  palpitations, []  shortness of breath when laying flat, []  shortness of breath with exertion,  []  pain in feet when walking, []  pain in feet when laying flat, []  history of blood clot in veins (DVT), []  history of phlebitis, []  swelling in legs, []  varicose veins  PULMONARY:  []  productive cough, []  asthma, []  wheezing  NEUROLOGIC:  []  weakness in arms or legs, []  numbness in arms or legs, []  difficulty speaking or slurred speech, []  temporary loss of vision in one eye, []  dizziness  HEMATOLOGIC:  []  bleeding problems, []   problems with blood clotting too easily  MUSCULOSKEL:  []  joint pain, []  joint swelling  GASTROINTEST:  []  vomiting blood, []  blood in stool     GENITOURINARY:  []  burning with urination, []  blood in urine  PSYCHIATRIC:  []  history of major depression  INTEGUMENTARY:  []  rashes, []  ulcers  CONSTITUTIONAL:  []  fever, []  chills  PRE-ADM LIVING: x] Home, []  Nursing home, []  Homeless  AMB STATUS: [x]  Walking, []  Walking w/ Assistance, []  Wheelchair, [] Bed ridden  For VQI Use RECENT HEART ATTACK (<6 mon): No  CAD Sx: [x]  No, []  Asx, h/o MI, []  Stable angina, []  Unstable angina  PRIOR CHF: [x]  No, []  Asx, []  Mild, []  Moderate, []  Severe  STRESS TEST: [x]  No, []  Normal, []  + ischemia, []  + MI, []  Both    Physical Examination  Filed Vitals:   07/20/12 1620  BP: 122/85  Pulse:  90  Resp: 18  Height: 6' (1.829 m)  Weight: 350 lb (158.759 kg)    Body mass index is 47.46 kg/(m^2).   Constitutional: He is oriented to person, place, and time. He appears well-developed.  Cardiovascular: Normal rate, regular rhythm and normal heart sounds.  No murmur heard.  Respiratory: Effort normal and breath sounds normal. He has no wheezes.  GI: Soft NTTP There is no tenderness.  Musculoskeletal: Normal range of motion.  Neurological: He is alert and oriented to person, place, and time.  Skin: Skin is warm.  Psychiatric: He has a normal mood and affect. His behavior is normal. Judgment and thought content normal.   Vascular: Vessel Right Left  Radial Palpable Palpable  Ulnar Palpable Palpable  Brachial    Carotid Palpable, with bruit Palpable, with bruit  Aorta Not palpable N/A  Femoral Palpable Palpable  Popliteal Not palpable Not palpable  PT  Palpable  Palpable  DP  Palpable  Palpable    Non-Invasive Vascular Imaging  Vein Mapping  (Date: 07/20/2012):   R arm: acceptable vein conduits include basilic 0.50-0.60  L arm: acceptable vein conduits include basilic 0.61-0.70    Medical Decision Making  Bradley [name] is a 37 y.o. male who presents with  ESRD requiring hemodialysis.   He is currently on dialysis using right IJ catheter.  Based on vein mapping and examination, this patient's permanent access options include: Right basilic with transposition.  I had an extensive discussion with this patient in regards to the nature of access surgery, including risk, benefits, and alternatives.    The patient is aware that the risks of access surgery include but are not limited to: bleeding, infection, steal syndrome, nerve damage, ischemic monomelic neuropathy, failure of access to mature, and possible need for additional access procedures in the future.  The patient has  agreed to proceed with the above procedure which will be scheduled in the near future.  He  dialysis at [facility]  Thomasena Edis, EMMA [facility] PA-C [facility] [place] Office: [phone]   07/20/2012, 4:40 PM   I have examined the patient, reviewed and agree with above. I reviewed the patient's recent shuntogram from an interventional radiology on 413. This showed a small cephalic vein above the antecubital space. He has unfortunately gone on to thrombose this both by physical exam and by venous ultrasound today. He has occluded cephalic veins bilaterally throughout their course. I did reimage his basilic vein with the SonoSite ultrasound on the right. Shows patency of the basilic vein from below the antecubital space. This does join the  brachial vein at the mid upper arm but remains good caliber all the way to the axilla. I feel that his only option for a fistula would be a basilic vein transposition. I discussed this at length with the patient including the advantages and disadvantages of a single-stage and two-stage procedure. He does have moderate size vein and may benefit from a two-stage procedure. He does dialyze via the catheter on Monday Wednesday and Friday. We have scheduled him for surgery with Dr. Edilia Bo who did his other 2 fistulas on may first of this week. EARLY, TODD, MD 07/20/2012 5:00 PM

## 2012-07-21 ENCOUNTER — Encounter (HOSPITAL_COMMUNITY): Payer: Self-pay | Admitting: Pharmacy Technician

## 2012-07-21 ENCOUNTER — Encounter (HOSPITAL_COMMUNITY): Payer: Self-pay | Admitting: *Deleted

## 2012-07-21 ENCOUNTER — Other Ambulatory Visit: Payer: Self-pay | Admitting: *Deleted

## 2012-07-21 NOTE — Progress Notes (Signed)
Pt denies SOB, chest pain, and being under the care of a cardiologist. Pt was diagnosed with Sleep Apnea at [facility]. Pt has a CPAP machine but does not currently use it because it  Malfunctions.

## 2012-07-22 ENCOUNTER — Telehealth: Payer: Self-pay | Admitting: Vascular Surgery

## 2012-07-22 ENCOUNTER — Ambulatory Visit (HOSPITAL_COMMUNITY)
Admission: RE | Admit: 2012-07-22 | Discharge: 2012-07-22 | Disposition: A | Discharge status: Home / Self Care | Payer: Medicare Other | Source: Ambulatory Visit | Attending: Vascular Surgery | Admitting: Vascular Surgery

## 2012-07-22 ENCOUNTER — Ambulatory Visit (HOSPITAL_COMMUNITY): Payer: Medicare Other | Admitting: Anesthesiology

## 2012-07-22 ENCOUNTER — Encounter (HOSPITAL_COMMUNITY): Payer: Self-pay | Admitting: Anesthesiology

## 2012-07-22 ENCOUNTER — Encounter (HOSPITAL_COMMUNITY)
Admission: RE | Disposition: A | Discharge status: Home / Self Care | Payer: Self-pay | Source: Ambulatory Visit | Attending: Vascular Surgery

## 2012-07-22 ENCOUNTER — Telehealth: Payer: Self-pay | Admitting: *Deleted

## 2012-07-22 ENCOUNTER — Other Ambulatory Visit: Payer: Self-pay | Admitting: *Deleted

## 2012-07-22 DIAGNOSIS — N186 End stage renal disease: Secondary | ICD-10-CM

## 2012-07-22 DIAGNOSIS — Z48812 Encounter for surgical aftercare following surgery on the circulatory system: Secondary | ICD-10-CM

## 2012-07-22 DIAGNOSIS — F17200 Nicotine dependence, unspecified, uncomplicated: Secondary | ICD-10-CM | POA: Insufficient documentation

## 2012-07-22 DIAGNOSIS — I120 Hypertensive chronic kidney disease with stage 5 chronic kidney disease or end stage renal disease: Secondary | ICD-10-CM | POA: Insufficient documentation

## 2012-07-22 DIAGNOSIS — Z4931 Encounter for adequacy testing for hemodialysis: Secondary | ICD-10-CM

## 2012-07-22 DIAGNOSIS — Z992 Dependence on renal dialysis: Secondary | ICD-10-CM | POA: Insufficient documentation

## 2012-07-22 HISTORY — DX: Unspecified asthma, uncomplicated: J45.909

## 2012-07-22 HISTORY — DX: Unspecified osteoarthritis, unspecified site: M19.90

## 2012-07-22 HISTORY — DX: Headache: R51

## 2012-07-22 HISTORY — PX: BASCILIC VEIN TRANSPOSITION: SHX5742

## 2012-07-22 HISTORY — DX: Sleep apnea, unspecified: G47.30

## 2012-07-22 HISTORY — DX: Dorsalgia, unspecified: M54.9

## 2012-07-22 LAB — POCT I-STAT 4, (NA,K, GLUC, HGB,HCT)
Glucose, Bld: 101 mg/dL — ABNORMAL HIGH (ref 70–99)
HCT: 46 % (ref 39.0–52.0)

## 2012-07-22 SURGERY — TRANSPOSITION, VEIN, BASILIC
Anesthesia: General | Site: Arm Upper | Laterality: Right | Wound class: Clean

## 2012-07-22 MED ORDER — SUCCINYLCHOLINE CHLORIDE 20 MG/ML IJ SOLN
INTRAMUSCULAR | Status: DC | PRN
  Administered 2012-07-22: 120 mg via INTRAVENOUS

## 2012-07-22 MED ORDER — CALCIUM CHLORIDE 10 % IV SOLN
INTRAVENOUS | Status: DC | PRN
  Administered 2012-07-22 (×2): 100 mg via INTRAVENOUS

## 2012-07-22 MED ORDER — SODIUM CHLORIDE 0.9 % IR SOLN
Status: DC | PRN
  Administered 2012-07-22: 09:00:00

## 2012-07-22 MED ORDER — VANCOMYCIN HCL 10 G IV SOLR
1500.0000 mg | INTRAVENOUS | Status: AC
  Administered 2012-07-22: 1500 mg via INTRAVENOUS
  Filled 2012-07-22: qty 1500

## 2012-07-22 MED ORDER — SODIUM CHLORIDE 0.9 % IV SOLN
INTRAVENOUS | Status: DC
  Administered 2012-07-22: 07:00:00 via INTRAVENOUS

## 2012-07-22 MED ORDER — OXYCODONE HCL 5 MG PO TABS
ORAL_TABLET | ORAL | Status: AC
  Administered 2012-07-22: 5 mg via ORAL
  Filled 2012-07-22: qty 1

## 2012-07-22 MED ORDER — OXYCODONE HCL 10 MG PO TB12: 10.0000 mg | ORAL_TABLET | Freq: Two times a day (BID) | ORAL | Status: DC

## 2012-07-22 MED ORDER — HYDROMORPHONE HCL PF 1 MG/ML IJ SOLN
0.2500 mg | INTRAMUSCULAR | Status: DC | PRN
  Administered 2012-07-22: 0.5 mg via INTRAVENOUS
  Administered 2012-07-22: 0.25 mg via INTRAVENOUS
  Administered 2012-07-22: 0.5 mg via INTRAVENOUS

## 2012-07-22 MED ORDER — HEPARIN SODIUM (PORCINE) 1000 UNIT/ML IJ SOLN
INTRAMUSCULAR | Status: DC | PRN
  Administered 2012-07-22: 10000 [IU] via INTRAVENOUS

## 2012-07-22 MED ORDER — LIDOCAINE HCL (CARDIAC) 20 MG/ML IV SOLN
INTRAVENOUS | Status: DC | PRN
  Administered 2012-07-22: 90 mg via INTRAVENOUS

## 2012-07-22 MED ORDER — FENTANYL CITRATE 0.05 MG/ML IJ SOLN
INTRAMUSCULAR | Status: DC | PRN
  Administered 2012-07-22 (×3): 100 ug via INTRAVENOUS
  Administered 2012-07-22: 50 ug via INTRAVENOUS

## 2012-07-22 MED ORDER — 0.9 % SODIUM CHLORIDE (POUR BTL) OPTIME
TOPICAL | Status: DC | PRN
  Administered 2012-07-22: 1000 mL

## 2012-07-22 MED ORDER — NEOSTIGMINE METHYLSULFATE 1 MG/ML IJ SOLN
INTRAMUSCULAR | Status: DC | PRN
  Administered 2012-07-22: 4 mg via INTRAVENOUS

## 2012-07-22 MED ORDER — PROPOFOL 10 MG/ML IV BOLUS
INTRAVENOUS | Status: DC | PRN
  Administered 2012-07-22: 290 mg via INTRAVENOUS

## 2012-07-22 MED ORDER — PAPAVERINE HCL 30 MG/ML IJ SOLN
60.0000 mg | Freq: Once | INTRAMUSCULAR | Status: DC
  Filled 2012-07-22: qty 2

## 2012-07-22 MED ORDER — GLYCOPYRROLATE 0.2 MG/ML IJ SOLN
INTRAMUSCULAR | Status: DC | PRN
  Administered 2012-07-22: 0.6 mg via INTRAVENOUS

## 2012-07-22 MED ORDER — ONDANSETRON HCL 4 MG/2ML IJ SOLN
INTRAMUSCULAR | Status: AC
  Administered 2012-07-22: 4 mg via INTRAVENOUS
  Filled 2012-07-22: qty 2

## 2012-07-22 MED ORDER — HYDROMORPHONE HCL PF 1 MG/ML IJ SOLN
INTRAMUSCULAR | Status: AC
  Administered 2012-07-22: 0.5 mg via INTRAVENOUS
  Filled 2012-07-22: qty 1

## 2012-07-22 MED ORDER — ONDANSETRON HCL 4 MG/2ML IJ SOLN: 4.0000 mg | Freq: Once | INTRAMUSCULAR | Status: AC | PRN

## 2012-07-22 MED ORDER — OXYCODONE HCL 5 MG PO TABS: 5.0000 mg | ORAL_TABLET | Freq: Once | ORAL | Status: AC

## 2012-07-22 MED ORDER — SODIUM CHLORIDE 0.9 % IV SOLN
INTRAVENOUS | Status: DC | PRN
  Administered 2012-07-22 (×2): via INTRAVENOUS

## 2012-07-22 MED ORDER — PROTAMINE SULFATE 10 MG/ML IV SOLN
INTRAVENOUS | Status: DC | PRN
  Administered 2012-07-22 (×4): 10 mg via INTRAVENOUS

## 2012-07-22 MED ORDER — SODIUM CHLORIDE 0.9 % IV SOLN
10.0000 mg | INTRAVENOUS | Status: DC | PRN
  Administered 2012-07-22: 40 ug/min via INTRAVENOUS

## 2012-07-22 MED ORDER — ONDANSETRON HCL 4 MG/2ML IJ SOLN
INTRAMUSCULAR | Status: DC | PRN
  Administered 2012-07-22: 4 mg via INTRAVENOUS

## 2012-07-22 MED ORDER — PHENYLEPHRINE HCL 10 MG/ML IJ SOLN
INTRAMUSCULAR | Status: DC | PRN
  Administered 2012-07-22 (×4): 80 ug via INTRAVENOUS
  Administered 2012-07-22: 40 ug via INTRAVENOUS
  Administered 2012-07-22 (×5): 80 ug via INTRAVENOUS
  Administered 2012-07-22: 40 ug via INTRAVENOUS

## 2012-07-22 MED ORDER — MUPIROCIN 2 % EX OINT
TOPICAL_OINTMENT | Freq: Two times a day (BID) | CUTANEOUS | Status: DC
  Administered 2012-07-22: 1 via NASAL
  Filled 2012-07-22 (×2): qty 22

## 2012-07-22 MED ORDER — ROCURONIUM BROMIDE 100 MG/10ML IV SOLN
INTRAVENOUS | Status: DC | PRN
  Administered 2012-07-22: 30 mg via INTRAVENOUS
  Administered 2012-07-22: 20 mg via INTRAVENOUS

## 2012-07-22 MED ORDER — PAPAVERINE HCL 30 MG/ML IJ SOLN
INTRAMUSCULAR | Status: DC | PRN
  Administered 2012-07-22: 60 mg via INTRAVENOUS

## 2012-07-22 MED ORDER — HYDROMORPHONE HCL PF 1 MG/ML IJ SOLN
INTRAMUSCULAR | Status: AC
  Administered 2012-07-22: 0.25 mg via INTRAVENOUS
  Filled 2012-07-22: qty 1

## 2012-07-22 SURGICAL SUPPLY — 50 items
CANISTER SUCTION 2500CC (MISCELLANEOUS) ×2 IMPLANT
CANNULA VESSEL W/WING WO/VALVE (CANNULA) ×2 IMPLANT
CLIP TI MEDIUM 24 (CLIP) ×2 IMPLANT
CLIP TI WIDE RED SMALL 24 (CLIP) ×2 IMPLANT
CLOTH BEACON ORANGE TIMEOUT ST (SAFETY) ×2 IMPLANT
COVER PROBE W GEL 5X96 (DRAPES) ×2 IMPLANT
COVER SURGICAL LIGHT HANDLE (MISCELLANEOUS) ×2 IMPLANT
DECANTER SPIKE VIAL GLASS SM (MISCELLANEOUS) ×2 IMPLANT
DERMABOND ADVANCED (GAUZE/BANDAGES/DRESSINGS) ×2
DERMABOND ADVANCED .7 DNX12 (GAUZE/BANDAGES/DRESSINGS) ×2 IMPLANT
DRAIN PENROSE 1/2X12 LTX STRL (WOUND CARE) IMPLANT
ELECT REM PT RETURN 9FT ADLT (ELECTROSURGICAL) ×2
ELECTRODE REM PT RTRN 9FT ADLT (ELECTROSURGICAL) ×1 IMPLANT
GAUZE SPONGE 4X4 16PLY XRAY LF (GAUZE/BANDAGES/DRESSINGS) ×2 IMPLANT
GLOVE BIO SURGEON STRL SZ 6.5 (GLOVE) ×4 IMPLANT
GLOVE BIO SURGEON STRL SZ7.5 (GLOVE) ×2 IMPLANT
GLOVE BIOGEL PI IND STRL 7.0 (GLOVE) ×1 IMPLANT
GLOVE BIOGEL PI IND STRL 7.5 (GLOVE) ×2 IMPLANT
GLOVE BIOGEL PI IND STRL 8 (GLOVE) ×1 IMPLANT
GLOVE BIOGEL PI IND STRL 8.5 (GLOVE) ×1 IMPLANT
GLOVE BIOGEL PI INDICATOR 7.0 (GLOVE) ×1
GLOVE BIOGEL PI INDICATOR 7.5 (GLOVE) ×2
GLOVE BIOGEL PI INDICATOR 8 (GLOVE) ×1
GLOVE BIOGEL PI INDICATOR 8.5 (GLOVE) ×1
GLOVE SURG SS PI 6.5 STRL IVOR (GLOVE) ×2 IMPLANT
GLOVE SURG SS PI 7.5 STRL IVOR (GLOVE) ×2 IMPLANT
GOWN PREVENTION PLUS XLARGE (GOWN DISPOSABLE) ×2 IMPLANT
GOWN PREVENTION PLUS XXLARGE (GOWN DISPOSABLE) ×2 IMPLANT
GOWN STRL NON-REIN LRG LVL3 (GOWN DISPOSABLE) ×4 IMPLANT
KIT BASIN OR (CUSTOM PROCEDURE TRAY) ×2 IMPLANT
KIT ROOM TURNOVER OR (KITS) ×2 IMPLANT
NEEDLE BLUNT 18X1 FOR OR ONLY (NEEDLE) ×2 IMPLANT
NS IRRIG 1000ML POUR BTL (IV SOLUTION) ×2 IMPLANT
PACK CV ACCESS (CUSTOM PROCEDURE TRAY) ×2 IMPLANT
PAD ARMBOARD 7.5X6 YLW CONV (MISCELLANEOUS) ×4 IMPLANT
SPONGE GAUZE 4X4 12PLY (GAUZE/BANDAGES/DRESSINGS) ×2 IMPLANT
SPONGE LAP 18X18 X RAY DECT (DISPOSABLE) ×2 IMPLANT
SPONGE SURGIFOAM ABS GEL 100 (HEMOSTASIS) IMPLANT
SUT PROLENE 6 0 BV (SUTURE) ×2 IMPLANT
SUT SILK 2 0 SH (SUTURE) ×2 IMPLANT
SUT SILK 3 0 (SUTURE) ×3
SUT SILK 3-0 18XBRD TIE 12 (SUTURE) ×3 IMPLANT
SUT VIC AB 3-0 SH 27 (SUTURE) ×3
SUT VIC AB 3-0 SH 27X BRD (SUTURE) ×3 IMPLANT
SUT VICRYL 4-0 PS2 18IN ABS (SUTURE) ×6 IMPLANT
SYR 20CC LL (SYRINGE) ×2 IMPLANT
TOWEL OR 17X24 6PK STRL BLUE (TOWEL DISPOSABLE) ×2 IMPLANT
TOWEL OR 17X26 10 PK STRL BLUE (TOWEL DISPOSABLE) ×2 IMPLANT
UNDERPAD 30X30 INCONTINENT (UNDERPADS AND DIAPERS) ×2 IMPLANT
WATER STERILE IRR 1000ML POUR (IV SOLUTION) ×2 IMPLANT

## 2012-07-22 NOTE — Telephone Encounter (Signed)
Message copied by Margaretmary Eddy on Thu Jul 22, 2012  1:13 PM ------      Message from: Melene Plan      Created: Thu Jul 22, 2012 11:30 AM      Regarding: FW: charge and f/u                   ----- Message -----         From: Chuck Hint, MD         Sent: 07/22/2012  10:29 AM           To: Reuel Derby, Melene Plan, RN, #      Subject: charge and f/u                                           PROCEDURE: right basilic vein transposition            SURGEON: Di Kindle. Edilia Bo, MD, FACS            ASSIST: Doreatha Massed PA            He will need a follow up visit in 6 weeks with a duplex to check on the maturation of his AV fistula. Thank you.CD ------

## 2012-07-22 NOTE — Anesthesia Preprocedure Evaluation (Addendum)
Anesthesia Evaluation  Patient identified by MRN, date of birth, ID band Patient awake    Reviewed: Allergy & Precautions, H&P , NPO status , Patient's Chart, lab work & pertinent test results, reviewed documented beta blocker date and time   Airway Mallampati: I TM Distance: >3 FB Neck ROM: full    Dental  (+) Teeth Intact and Dental Advisory Given   Pulmonary asthma , sleep apnea ,          Cardiovascular hypertension, Pt. on medications Rhythm:regular Rate:Normal     Neuro/Psych  Headaches,    GI/Hepatic   Endo/Other    Renal/GU ESRF and DialysisRenal diseaseDialyzed Wed 4/20 via right chest dialysis cath     Musculoskeletal   Abdominal   Peds  Hematology   Anesthesia Other Findings   Reproductive/Obstetrics                         Anesthesia Physical Anesthesia Plan  ASA: III  Anesthesia Plan: General and MAC   Post-op Pain Management:    Induction: Intravenous  Airway Management Planned: LMA, Oral ETT and Mask  Additional Equipment:   Intra-op Plan:   Post-operative Plan: Extubation in OR  Informed Consent: I have reviewed the patients History and Physical, chart, labs and discussed the procedure including the risks, benefits and alternatives for the proposed anesthesia with the patient or authorized representative who has indicated his/her understanding and acceptance.     Plan Discussed with: CRNA, Anesthesiologist and Surgeon  Anesthesia Plan Comments:         Anesthesia Quick Evaluation

## 2012-07-22 NOTE — Op Note (Signed)
NAMELAUREL [name]   MRN: [id number] DOB: Jul 31, 1975    DATE OF OPERATION: 07/22/2012  PREOP DIAGNOSIS: Chronic kidney disease  POSTOP DIAGNOSIS: Same  PROCEDURE: right basilic vein transposition  SURGEON: Di Kindle. Edilia Bo, MD, FACS  ASSIST: Doreatha Massed PA  ANESTHESIA: Gen.   EBL: minimal  INDICATIONS: [name] is a 37 y.o. male who presents for new access.  FINDINGS: these basilic vein was reasonable size but emptied into the brachial system the mid upper arm. The brachial artery was small.  TECHNIQUE: The patient was brought to the operating room and received a general anesthetic. The right upper extremity was prepped and draped in usual sterile fashion. Using 4 incisions along the medial aspect of the right arm the basilic vein was harvested with branches divided between clips and 3-0 silk ties. Skin bridges were left between the 4 incisions. In the mid upper arm the basilic vein into the brachial vein. Therefore I harvested the brachial vein from this point up to the axilla. The vein was then ligated distally and irrigated out with heparinized saline. Through the incision just above the antecubital level the brachial artery was exposed. It was small. I did and carried it with the Doppler to be sure that this was the brachial artery which it was. Was irrigated with papaverine. The vein was then distended and then marked prevent twisting and brought through a curved tunnel in the upper arm. The patient was then heparinized. The brachial artery was clamped proximally and distally and a longitudinal arteriotomy was made. The vein was spatulated and sewn end to side to the artery using continuous 6-0 Prolene suture. At the completion was a palpable thrill in the fistula and a good ulnar signal with the Doppler. The patient had a weak radial signal preoperatively. Hemostasis was obtained in the wounds and the heparin was partially reversed with protamine. Each of the wounds was closed  with deep layer 3-0 Vicryl and the skin closed with 4-0 Vicryl. Dermabond was applied. The patient tolerated the procedure well and was transferred to the recovery room in stable condition. All needle and sponge counts were correct.  [name], MD, FACS [facility] Ve[facility] Specialists of [facility]  DATE OF DICTATION:   07/22/2012

## 2012-07-22 NOTE — Progress Notes (Signed)
Spoke with Gerome Apley, PA for nephrology. Notified PA pt's K 5.1 today and scheduled for dialysis tomorrow (Friday). Per PA, pt okay to go to dialysis tomorrow. No new orders received.

## 2012-07-22 NOTE — Anesthesia Procedure Notes (Signed)
Procedure Name: Intubation Date/Time: 07/22/2012 7:33 AM Performed by: Marni Griffon Pre-anesthesia Checklist: Patient identified, Emergency Drugs available, Suction available and Patient being monitored Patient Re-evaluated:Patient Re-evaluated prior to inductionOxygen Delivery Method: Circle system utilized Preoxygenation: Pre-oxygenation with 100% oxygen Intubation Type: IV induction Ventilation: Mask ventilation without difficulty Laryngoscope Size: Mac and 4 Grade View: Grade I Tube type: Oral Tube size: 7.5 mm Number of attempts: 1 Airway Equipment and Method: Stylet Placement Confirmation: ETT inserted through vocal cords under direct vision,  breath sounds checked- equal and bilateral and positive ETCO2 Secured at: 23 (cm at teeth) cm Tube secured with: Tape Dental Injury: Teeth and Oropharynx as per pre-operative assessment

## 2012-07-22 NOTE — Transfer of Care (Signed)
Immediate Anesthesia Transfer of Care Note  Patient: Bradley [name]  Procedure(s) Performed: Procedure(s): BASCILIC VEIN TRANSPOSITION (Right)  Patient Location: PACU  Anesthesia Type:General  Level of Consciousness: awake, alert , oriented and patient cooperative  Airway & Oxygen Therapy: Patient Spontanous Breathing and Patient connected to nasal cannula oxygen  Post-op Assessment: Report given to PACU RN, Post -op Vital signs reviewed and stable and Patient moving all extremities  Post vital signs: Reviewed and stable  Complications: No apparent anesthesia complications

## 2012-07-22 NOTE — Preoperative (Signed)
Beta Blockers   Reason not to administer Beta Blockers:Not Applicable 

## 2012-07-22 NOTE — Telephone Encounter (Signed)
LVM for pt re appt info, sent letter - kf

## 2012-07-22 NOTE — Telephone Encounter (Signed)
Pharmacist at [facility] called to report that this patient is on Pain contract with [facility] and has been getting Oxycontin 10mg  since last fall from them. Patient got discharge rx today for oxycodone 10mg  after having BVT surgery by Dr. Edilia Bo. Reviewed this with Dr. Darrick Penna and he instructed me to cancel the prescription written today because patient received # 90 on 07-13-12 and takes them q 8 hours per pharmacy. Patient will only get pain meds from Dr. Dois Davenport. Pharmacist will instruct the patient and tear up our Rx.

## 2012-07-22 NOTE — H&P (View-Only) (Signed)
[facility] [place]  Referred by:  Kellie A Goldsborough, MD [address] [place], [place] [address]  Reason for referral: New access.  He has had previous access AV fistula in left upper arm and most recently right forearm.  The right forearm became non functional 3 weeks ago.  He was seen by IR at [facility] and the fistula failed thrombectomy attemts.  History of Present Illness  Bradley [name] is a 37 y.o. (09/18/1975) male who presents for evaluation for permanent access.  The patient is right hand dominant.  The patient has had previous access procedures.  Previous central venous cannulation procedures include: left and currently a right IJ catheter..  The patient has not had a  PPM placed.   Past Medical History  Diagnosis Date  . Hypertension   . Renal disorder     Past Surgical History  Procedure Laterality Date  . Dg av dialysis  shunt access exist*l* or    . Appendectomy      History   Social History  . Marital Status: Married    Spouse Name: N/A    Number of Children: N/A  . Years of Education: N/A   Occupational History  . Not on file.   Social History Main Topics  . Smoking status: Current Some Day Smoker    Types: Cigarettes  . Smokeless tobacco: Never Used  . Alcohol Use: No  . Drug Use: No  . Sexually Active: Not on file   Other Topics Concern  . Not on file   Social History Narrative  . No narrative on file    Family History  Problem Relation Age of Onset  . Hypertension Mother   . Peripheral vascular disease Mother   . Diabetes Father   . Hyperlipidemia Father   . Hypertension Father   . Peripheral vascular disease Maternal Grandmother       Current Outpatient Prescriptions on File Prior to Visit  Medication Sig Dispense Refill  . calcium acetate (PHOSLO) 667 MG capsule Take 1,334-3,335 mg by mouth See admin instructions. Take 5 capsules with meals and 2 capsules with a snack      . cinacalcet (SENSIPAR) 90 MG tablet  Take 90 mg by mouth 2 (two) times daily.      . lanthanum (FOSRENOL) 1000 MG chewable tablet Chew 1,000 mg by mouth 2 (two) times daily with a meal.       . multivitamin (RENA-VIT) TABS tablet Take 1 tablet by mouth daily.        . oxycodone (OXY-IR) 5 MG capsule Take 5-10 mg by mouth 3 (three) times daily as needed for pain. For pain       No current facility-administered medications on file prior to visit.    Allergies  Allergen Reactions  . Penicillins Anaphylaxis    REVIEW OF SYSTEMS:  (Positives checked otherwise negative)  CARDIOVASCULAR:  [] chest pain, [] chest pressure, [] palpitations, [] shortness of breath when laying flat, [] shortness of breath with exertion,  [] pain in feet when walking, [] pain in feet when laying flat, [] history of blood clot in veins (DVT), [] history of phlebitis, [] swelling in legs, [] varicose veins  PULMONARY:  [] productive cough, [] asthma, [] wheezing  NEUROLOGIC:  [] weakness in arms or legs, [] numbness in arms or legs, [] difficulty speaking or slurred speech, [] temporary loss of vision in one eye, [] dizziness  HEMATOLOGIC:  [] bleeding problems, []   problems with blood clotting too easily  MUSCULOSKEL:  [] joint pain, [] joint swelling  GASTROINTEST:  [] vomiting blood, [] blood in stool     GENITOURINARY:  [] burning with urination, [] blood in urine  PSYCHIATRIC:  [] history of major depression  INTEGUMENTARY:  [] rashes, [] ulcers  CONSTITUTIONAL:  [] fever, [] chills  PRE-ADM LIVING: x] Home, [] Nursing home, [] Homeless  AMB STATUS: [x] Walking, [] Walking w/ Assistance, [] Wheelchair, []Bed ridden  For VQI Use RECENT HEART ATTACK (<6 mon): No  CAD Sx: [x] No, [] Asx, h/o MI, [] Stable angina, [] Unstable angina  PRIOR CHF: [x] No, [] Asx, [] Mild, [] Moderate, [] Severe  STRESS TEST: [x] No, [] Normal, [] + ischemia, [] + MI, [] Both    Physical Examination  Filed Vitals:   07/20/12 1620  BP: 122/85  Pulse:  90  Resp: 18  Height: 6' (1.829 m)  Weight: 350 lb (158.759 kg)    Body mass index is 47.46 kg/(m^2).   Constitutional: He is oriented to person, place, and time. He appears well-developed.  Cardiovascular: Normal rate, regular rhythm and normal heart sounds.  No murmur heard.  Respiratory: Effort normal and breath sounds normal. He has no wheezes.  GI: Soft NTTP There is no tenderness.  Musculoskeletal: Normal range of motion.  Neurological: He is alert and oriented to person, place, and time.  Skin: Skin is warm.  Psychiatric: He has a normal mood and affect. His behavior is normal. Judgment and thought content normal.   Vascular: Vessel Right Left  Radial Palpable Palpable  Ulnar Palpable Palpable  Brachial    Carotid Palpable, with bruit Palpable, with bruit  Aorta Not palpable N/A  Femoral Palpable Palpable  Popliteal Not palpable Not palpable  PT  Palpable  Palpable  DP  Palpable  Palpable    Non-Invasive Vascular Imaging  Vein Mapping  (Date: 07/20/2012):   R arm: acceptable vein conduits include basilic 0.50-0.60  L arm: acceptable vein conduits include basilic 0.61-0.70    Medical Decision Making  Bradley [name] is a 37 y.o. male who presents with  ESRD requiring hemodialysis.   He is currently on dialysis using right IJ catheter.  Based on vein mapping and examination, this patient's permanent access options include: Right basilic with transposition.  I had an extensive discussion with this patient in regards to the nature of access surgery, including risk, benefits, and alternatives.    The patient is aware that the risks of access surgery include but are not limited to: bleeding, infection, steal syndrome, nerve damage, ischemic monomelic neuropathy, failure of access to mature, and possible need for additional access procedures in the future.  The patient has  agreed to proceed with the above procedure which will be scheduled in the near future.  He  dialysis at [facility]  COLLINS, EMMA MAUREEN PA-C [facility] Office: [phone]   07/20/2012, 4:40 PM   I have examined the patient, reviewed and agree with above. I reviewed the patient's recent shuntogram from an interventional radiology on 413. This showed a small cephalic vein above the antecubital space. He has unfortunately gone on to thrombose this both by physical exam and by venous ultrasound today. He has occluded cephalic veins bilaterally throughout their course. I did reimage his basilic vein with the SonoSite ultrasound on the right. Shows patency of the basilic vein from below the antecubital space. This does join the   brachial vein at the mid upper arm but remains good caliber all the way to the axilla. I feel that his only option for a fistula would be a basilic vein transposition. I discussed this at length with the patient including the advantages and disadvantages of a single-stage and two-stage procedure. He does have moderate size vein and may benefit from a two-stage procedure. He does dialyze via the catheter on Monday Wednesday and Friday. We have scheduled him for surgery with Dr. Dickson who did his other 2 fistulas on may first of this week. EARLY, TODD, MD 07/20/2012 5:00 PM  

## 2012-07-22 NOTE — Anesthesia Postprocedure Evaluation (Signed)
  Anesthesia Post-op Note  Patient: Bradley [name]  Procedure(s) Performed: Procedure(s): BASCILIC VEIN TRANSPOSITION (Right)  Patient Location: PACU  Anesthesia Type:General  Level of Consciousness: awake, oriented and patient cooperative  Airway and Oxygen Therapy: Patient Spontanous Breathing  Post-op Pain: mild  Post-op Assessment: Post-op Vital signs reviewed, Patient's Cardiovascular Status Stable, Respiratory Function Stable, Patent Airway, No signs of Nausea or vomiting and Pain level controlled  Post-op Vital Signs: stable  Complications: No apparent anesthesia complications

## 2012-07-22 NOTE — Interval H&P Note (Signed)
History and Physical Interval Note:  07/22/2012 7:21 AM  Bradley [name]  has presented today for surgery, with the diagnosis of ESRD  The various methods of treatment have been discussed with the patient and family. After consideration of risks, benefits and other options for treatment, the patient has consented to  Procedure(s): BASCILIC VEIN TRANSPOSITION (Right) as a surgical intervention .  The patient's history has been reviewed, patient examined, no change in status, stable for surgery.  I have reviewed the patient's chart and labs.  Questions were answered to the patient's satisfaction.     [name] S

## 2012-07-23 ENCOUNTER — Telehealth: Payer: Self-pay

## 2012-07-23 NOTE — Telephone Encounter (Signed)
Phone call from pt. with report of symptoms: c/o numbness/tingling of (R) thumb, and a coolness and numbness of the tips of (R) index, and middle fingers.   Denies pain.  States able to move thumb and fingers of right hand, and able to grip with the right hand.  Discussed symptoms may be related to nerve irritation during the surgery.  Advised to continue to monitor, gently do active ROM with right hand and fingers.  Advised to give more time to see if symptoms improve.  Advised to call back if has worsening of numbness, change in temperature of hand to cool/cold, pain in hand, and difficulty gripping with right hand.  Encouraged to elevate right arm, at intervals, to reduce any swelling, and if increased pain in hand/fingers with elevation, then to position hand downward to increase blood flow to distal end.  Pt. Verb. Understanding.  Will call back if sx's worsen.

## 2012-07-26 ENCOUNTER — Encounter (HOSPITAL_COMMUNITY): Payer: Self-pay | Admitting: Vascular Surgery

## 2012-09-07 ENCOUNTER — Encounter: Payer: Self-pay | Admitting: Vascular Surgery

## 2012-09-08 ENCOUNTER — Ambulatory Visit (INDEPENDENT_AMBULATORY_CARE_PROVIDER_SITE_OTHER): Payer: Medicare Other | Admitting: Vascular Surgery

## 2012-09-08 ENCOUNTER — Ambulatory Visit: Payer: Medicare Other | Admitting: Vascular Surgery

## 2012-09-08 ENCOUNTER — Encounter: Payer: Self-pay | Admitting: Vascular Surgery

## 2012-09-08 ENCOUNTER — Encounter (INDEPENDENT_AMBULATORY_CARE_PROVIDER_SITE_OTHER): Payer: Medicare Other | Admitting: *Deleted

## 2012-09-08 VITALS — BP 140/98 | HR 100 | Resp 18 | Ht 72.0 in | Wt 345.3 lb

## 2012-09-08 DIAGNOSIS — N186 End stage renal disease: Secondary | ICD-10-CM

## 2012-09-08 DIAGNOSIS — Z48812 Encounter for surgical aftercare following surgery on the circulatory system: Secondary | ICD-10-CM

## 2012-09-08 DIAGNOSIS — Z4931 Encounter for adequacy testing for hemodialysis: Secondary | ICD-10-CM

## 2012-09-08 NOTE — Assessment & Plan Note (Signed)
His right basilic vein transposition appears to be maturing adequately. Diameters appear reasonable on duplex. I think that this should be ready to be used in approximally 6 weeks. Certainly if there are any concerns, I would be happy to see him back prior to cannulation.

## 2012-09-08 NOTE — Progress Notes (Signed)
[facility]  Patient name: Bradley [name] MRN: [id number] DOB: Jun 23, 1975 Sex: male  REASON FOR VISIT: follow up after her right basilic vein transposition  HPI: Bradley [name] is a 37 y.o. male who dialyzes on Monday Wednesdays and Fridays. He has a functioning catheter. I performed a right basilic vein transposition on 07/22/2012. The basilic vein was reasonable size however it emptied into the brachial system in the mid upper arm. Brachial artery was on the small side. He comes in for a routine 6 week follow up visit. He has no significant pain or paresthesias in the right arm.  REVIEW OF SYSTEMS: Arly.Keller ] denotes positive finding; [  ] denotes negative finding  CARDIOVASCULAR:  [ ]  chest pain   [ ]  dyspnea on exertion    CONSTITUTIONAL:  [ ]  fever   [ ]  chills  PHYSICAL EXAM: Filed Vitals:   09/08/12 1249  BP: 140/98  Pulse: 100  Resp: 18  Height: 6' (1.829 m)  Weight: 345 lb 4.8 oz (156.627 kg)   Body mass index is 46.82 kg/(m^2). GENERAL: The patient is a well-nourished male, in no acute distress. The vital signs are documented above. CARDIOVASCULAR: There is a regular rate and rhythm  PULMONARY: There is good air exchange bilaterally without wheezing or rales. His right basilic vein transposition has a good bruit and thrill. The right hand is warm and well-perfused.  I have independently interpreted his duplex of his fistula which shows that the diameters range from 0.63 to 0.79 cm. Depths range from 0.35-0.73 cm.  MEDICAL ISSUES:  End stage renal disease His right basilic vein transposition appears to be maturing adequately. Diameters appear reasonable on duplex. I think that this should be ready to be used in approximally 6 weeks. Certainly if there are any concerns, I would be happy to see him back prior to cannulation.   Natonya Finstad S [facility] [place] [facility]: [phone]

## 2012-09-30 ENCOUNTER — Encounter (HOSPITAL_COMMUNITY): Payer: Self-pay | Admitting: Emergency Medicine

## 2012-09-30 ENCOUNTER — Emergency Department (HOSPITAL_COMMUNITY)
Admission: EM | Admit: 2012-09-30 | Discharge: 2012-09-30 | Disposition: A | Discharge status: Home / Self Care | Payer: Medicare Other | Attending: Emergency Medicine | Admitting: Emergency Medicine

## 2012-09-30 DIAGNOSIS — L0231 Cutaneous abscess of buttock: Secondary | ICD-10-CM | POA: Insufficient documentation

## 2012-09-30 DIAGNOSIS — Z8669 Personal history of other diseases of the nervous system and sense organs: Secondary | ICD-10-CM | POA: Insufficient documentation

## 2012-09-30 DIAGNOSIS — Z79899 Other long term (current) drug therapy: Secondary | ICD-10-CM | POA: Insufficient documentation

## 2012-09-30 DIAGNOSIS — Z8739 Personal history of other diseases of the musculoskeletal system and connective tissue: Secondary | ICD-10-CM | POA: Insufficient documentation

## 2012-09-30 DIAGNOSIS — I10 Essential (primary) hypertension: Secondary | ICD-10-CM | POA: Insufficient documentation

## 2012-09-30 DIAGNOSIS — F17200 Nicotine dependence, unspecified, uncomplicated: Secondary | ICD-10-CM | POA: Insufficient documentation

## 2012-09-30 DIAGNOSIS — Z8679 Personal history of other diseases of the circulatory system: Secondary | ICD-10-CM | POA: Insufficient documentation

## 2012-09-30 DIAGNOSIS — Z8709 Personal history of other diseases of the respiratory system: Secondary | ICD-10-CM | POA: Insufficient documentation

## 2012-09-30 DIAGNOSIS — L0291 Cutaneous abscess, unspecified: Secondary | ICD-10-CM

## 2012-09-30 DIAGNOSIS — Z87448 Personal history of other diseases of urinary system: Secondary | ICD-10-CM | POA: Insufficient documentation

## 2012-09-30 MED ORDER — HYDROCODONE-ACETAMINOPHEN 5-325 MG PO TABS: 1.0000 | ORAL_TABLET | Freq: Four times a day (QID) | ORAL | Status: DC | PRN

## 2012-09-30 NOTE — ED Notes (Signed)
Pt states he has an abscess on his buttock. Pt has hx of the same that he has had drained before but it keeps occuring.

## 2012-09-30 NOTE — ED Provider Notes (Signed)
History    CSN: [id number] Arrival date & time 09/30/12  0917  First MD Initiated Contact with Patient 09/30/12 1007     Chief Complaint  Patient presents with  . Abscess   (Consider location/radiation/quality/duration/timing/severity/associated sxs/prior Treatment) HPI  Bradley [name] is a 37 y.o.male with a significant PMH of hypertension, renal disorder, asthma, headache, sleep apnea, athritis, and pain pain presents to the ER with complaints of abscess to his right buttock. He says he keeps getting a reoccurring abscess in the same spot. Has not seen a Careers adviser. Its smaller today than the past few times. Normally I&D significantly improves his symptoms. No fevers, n, v, d, no surrounding redness.  Past Medical History  Diagnosis Date  . Hypertension   . Renal disorder   . Asthma     Hx: of as a child  . Sleep apnea   . Headache(784.0)   . Arthritis   . Back pain     Hx: of back injury   Past Surgical History  Procedure Laterality Date  . Dg av dialysis  shunt access exist*l* or    . Appendectomy    . Bascilic vein transposition Right 07/22/2012    Procedure: BASCILIC VEIN TRANSPOSITION;  Surgeon: Chuck Hint, MD;  Location[facility];  Service: Vascular;  Laterality: Right;   Family History  Problem Relation Age of Onset  . Hypertension Mother   . Peripheral vascular disease Mother   . Diabetes Father   . Hyperlipidemia Father   . Hypertension Father   . Peripheral vascular disease Maternal Grandmother    History  Substance Use Topics  . Smoking status: Current Some Day Smoker -- 0.50 packs/day    Types: Cigarettes  . Smokeless tobacco: Never Used  . Alcohol Use: No    Review of Systems  Review of Systems  Gen: no weight loss, fevers, chills, night sweats  Eyes: no discharge or drainage, no occular pain or visual changes  Nose: no epistaxis or rhinorrhea  Mouth: no dental pain, no sore throat  Neck: no neck pain  Lungs:No wheezing, coughing or  hemoptysis CV: no chest pain, palpitations, dependent edema or orthopnea  Abd: no abdominal pain, nausea, vomiting  GU: no dysuria or gross hematuria  MSK:  No abnormalities  Neuro: no headache, no focal neurologic deficits  Skin: buttock abscess Psyche: negative.    Allergies  Penicillins  Home Medications   Current Outpatient Rx  Name  Route  Sig  Dispense  Refill  . calcium acetate (PHOSLO) 667 MG capsule   Oral   Take 1,334-3,335 mg by mouth See admin instructions. Take 5 capsules with meals and 2 capsules with a snack         . cinacalcet (SENSIPAR) 90 MG tablet   Oral   Take 90 mg by mouth 2 (two) times daily.         Marland Kitchen lanthanum (FOSRENOL) 1000 MG chewable tablet   Oral   Chew 1,000 mg by mouth 2 (two) times daily with a meal.          . multivitamin (RENA-VIT) TABS tablet   Oral   Take 1 tablet by mouth daily.           . Oxycodone HCl 10 MG TABS   Oral   Take 10 mg by mouth every 6 (six) hours as needed (for pain).          BP 148/81  Pulse 110  Temp(Src) 98.5 F (36.9 C) (  Oral)  Resp 18  SpO2 99% Physical Exam  Nursing note and vitals reviewed. Constitutional: He appears well-developed and well-nourished. No distress.  HENT:  Head: Normocephalic and atraumatic.  Eyes: Pupils are equal, round, and reactive to light.  Neck: Normal range of motion. Neck supple.  Cardiovascular: Normal rate and regular rhythm.   Pulmonary/Chest: Effort normal.  Abdominal: Soft.  Genitourinary:     Neurological: He is alert.  Skin: Skin is warm and dry.    ED Course  Procedures (including critical care time) Labs Reviewed - No data to display No results found. No diagnosis found. Dx: abscess   MDM  INCISION AND DRAINAGE Performed by: Dorthula Matas Consent: Verbal consent obtained. Risks and benefits: risks, benefits and alternatives were discussed Type: abscess  Body area: right buttock  Anesthesia: local infiltration  Incision was  made with a scalpel.  Local anesthetic: lidocaine 2% with epinephrine  Anesthetic total: 3 ml  Complexity: complex Blunt dissection to break up loculations  Drainage: purulent  Drainage amount: moderate   Patient tolerance: Patient tolerated the procedure well with no immediate complications.  Pt referred to Gen surg for reoccurring abscess.  37 y.o.Bradley [name]'s evaluation in the Emergency Department is complete. It has been determined that no acute conditions requiring further emergency intervention are present at this time. The patient/guardian have been advised of the diagnosis and plan. We have discussed signs and symptoms that warrant return to the ED, such as changes or worsening in symptoms.  Vital signs are stable at discharge. Filed Vitals:   09/30/12 0934  BP: 148/81  Pulse:   Temp:   Resp:     Patient/guardian has voiced understanding and agreed to follow-up with the PCP or specialist.     Dorthula Matas, PA-C 09/30/12 1100

## 2012-09-30 NOTE — ED Notes (Signed)
PA at bedside.

## 2012-09-30 NOTE — ED Provider Notes (Signed)
Medical screening examination/treatment/procedure(s) were performed by non-physician practitioner and as supervising physician I was immediately available for consultation/collaboration.   Charles B. Sheldon, MD 09/30/12 1104 

## 2013-01-19 ENCOUNTER — Emergency Department (HOSPITAL_COMMUNITY)
Admission: EM | Admit: 2013-01-19 | Discharge: 2013-01-19 | Disposition: A | Discharge status: Home / Self Care | Payer: Medicare Other | Attending: Emergency Medicine | Admitting: Emergency Medicine

## 2013-01-19 ENCOUNTER — Encounter (HOSPITAL_COMMUNITY): Payer: Self-pay | Admitting: Emergency Medicine

## 2013-01-19 DIAGNOSIS — R52 Pain, unspecified: Secondary | ICD-10-CM | POA: Insufficient documentation

## 2013-01-19 DIAGNOSIS — M129 Arthropathy, unspecified: Secondary | ICD-10-CM | POA: Insufficient documentation

## 2013-01-19 DIAGNOSIS — Z79899 Other long term (current) drug therapy: Secondary | ICD-10-CM | POA: Insufficient documentation

## 2013-01-19 DIAGNOSIS — I10 Essential (primary) hypertension: Secondary | ICD-10-CM | POA: Insufficient documentation

## 2013-01-19 DIAGNOSIS — M545 Low back pain, unspecified: Secondary | ICD-10-CM

## 2013-01-19 DIAGNOSIS — Z87448 Personal history of other diseases of urinary system: Secondary | ICD-10-CM | POA: Insufficient documentation

## 2013-01-19 DIAGNOSIS — F17200 Nicotine dependence, unspecified, uncomplicated: Secondary | ICD-10-CM | POA: Insufficient documentation

## 2013-01-19 DIAGNOSIS — J45909 Unspecified asthma, uncomplicated: Secondary | ICD-10-CM | POA: Insufficient documentation

## 2013-01-19 DIAGNOSIS — Z880 Allergy status to penicillin: Secondary | ICD-10-CM | POA: Insufficient documentation

## 2013-01-19 DIAGNOSIS — R269 Unspecified abnormalities of gait and mobility: Secondary | ICD-10-CM | POA: Insufficient documentation

## 2013-01-19 DIAGNOSIS — G8929 Other chronic pain: Secondary | ICD-10-CM | POA: Insufficient documentation

## 2013-01-19 MED ORDER — METHOCARBAMOL 500 MG PO TABS: 500.0000 mg | ORAL_TABLET | Freq: Two times a day (BID) | ORAL | Status: DC | PRN

## 2013-01-19 MED ORDER — METHOCARBAMOL 100 MG/ML IJ SOLN
1000.0000 mg | Freq: Once | INTRAMUSCULAR | Status: AC
Start: 2013-01-19 — End: 2013-01-19
  Administered 2013-01-19: 1000 mg via INTRAMUSCULAR
  Filled 2013-01-19: qty 10

## 2013-01-19 MED ORDER — OXYCODONE-ACETAMINOPHEN 5-325 MG PO TABS: 2.0000 | ORAL_TABLET | ORAL | Status: DC | PRN

## 2013-01-19 MED ORDER — HYDROMORPHONE HCL PF 1 MG/ML IJ SOLN
1.0000 mg | Freq: Once | INTRAMUSCULAR | Status: AC
  Administered 2013-01-19: 1 mg via INTRAMUSCULAR
  Filled 2013-01-19: qty 1

## 2013-01-19 NOTE — ED Provider Notes (Signed)
CSN: [id number]     Arrival date & time 01/19/13  1425 History   This chart was scribed for non-physician practitioner Norval Morton, PA-C working with Audree Camel, MD by Valera Castle, ED scribe. This patient was seen in room TR11C/TR11C and the patient's care was started at 3:54 PM.      Chief Complaint  Patient presents with  . Back Pain   The history is provided by the patient and medical records. No language interpreter was used.   HPI Comments: Bradley [name] is a 37 y.o. male with h/o chronic back pain who presents to the Emergency Department complaining of chronic, moderate, constant, lower back pain, onset yesterday. He states that his current pain is worse than his h/o back pain. He reports being unable to stand straight up and denies being able to ambulate. He denies any recent injuries or falls. He denies taking any medication for the pain, due to running out of his Oxycodone. He reports taking Tylenol, with no relief. He reports his h/o back pain is from many years ago when he twisted it catching someone at work. He states he saw pain management last 11/27/2012. He reports he missed his 12/2012 appointment, but will attend his 01/2013 appointment.He denies numbness and tingling in his LE, bladder and bowel incontinence, and any other associated symptoms. He denies any other medical history.   PCP - Karle Plumber, MD  Past Medical History  Diagnosis Date  . Hypertension   . Renal disorder   . Asthma     Hx: of as a child  . Sleep apnea   . Headache(784.0)   . Arthritis   . Back pain     Hx: of back injury   Past Surgical History  Procedure Laterality Date  . Dg av dialysis  shunt access exist*l* or    . Appendectomy    . Bascilic vein transposition Right 07/22/2012    Procedure: BASCILIC VEIN TRANSPOSITION;  Surgeon: Chuck Hint, MD;  Location: [facility] OR;  Service: Vascular;  Laterality: Right;   Family History  Problem Relation Age of Onset  .  Hypertension Mother   . Peripheral vascular disease Mother   . Diabetes Father   . Hyperlipidemia Father   . Hypertension Father   . Peripheral vascular disease Maternal Grandmother    History  Substance Use Topics  . Smoking status: Current Some Day Smoker -- 0.50 packs/day    Types: Cigarettes  . Smokeless tobacco: Never Used  . Alcohol Use: No    Review of Systems  Constitutional: Negative for fever and fatigue.  Respiratory: Negative for chest tightness and shortness of breath.   Cardiovascular: Negative for chest pain.  Gastrointestinal: Negative for nausea, vomiting, abdominal pain and diarrhea.  Genitourinary: Negative for dysuria, urgency, frequency and hematuria.       Negative for bladder and bowel incontinence.  Musculoskeletal: Positive for back pain (lower) and gait problem ( 2/2 pain). Negative for joint swelling, neck pain and neck stiffness.  Skin: Negative for rash.  Neurological: Negative for weakness, light-headedness, numbness and headaches.  All other systems reviewed and are negative.    Allergies  Penicillins  Home Medications   Current Outpatient Rx  Name  Route  Sig  Dispense  Refill  . cinacalcet (SENSIPAR) 90 MG tablet   Oral   Take 180 mg by mouth daily.          . multivitamin (RENA-VIT) TABS tablet   Oral   Take 1  tablet by mouth daily.           . Oxycodone HCl 10 MG TABS   Oral   Take 10 mg by mouth every 6 (six) hours as needed (for pain).         Marland Kitchen PRESCRIPTION MEDICATION   Oral   Take 2 tablets by mouth 3 (three) times daily with meals.         . methocarbamol (ROBAXIN) 500 MG tablet   Oral   Take 1 tablet (500 mg total) by mouth 2 (two) times daily as needed.   20 tablet   2   . oxyCODONE-acetaminophen (PERCOCET/ROXICET) 5-325 MG per tablet   Oral   Take 2 tablets by mouth every 4 (four) hours as needed for pain.   21 tablet   0    Triage Vitals: BP 120/56  Pulse 103  Temp(Src) 97.9 F (36.6 C) (Oral)   Resp 18  Ht 6' (1.829 m)  Wt 347 lb 3.6 oz (157.5 kg)  BMI 47.08 kg/m2  SpO2 98%  Physical Exam  Nursing note and vitals reviewed. Constitutional: He appears well-developed and well-nourished. No distress.  HENT:  Head: Normocephalic and atraumatic.  Mouth/Throat: Oropharynx is clear and moist. No oropharyngeal exudate.  Eyes: Conjunctivae are normal.  Neck: Normal range of motion. Neck supple.  Full ROM without pain  Cardiovascular: Normal rate, regular rhythm and intact distal pulses.   Pulmonary/Chest: Effort normal and breath sounds normal. No respiratory distress. He has no wheezes.  Abdominal: Soft. He exhibits no distension. There is no tenderness.  Musculoskeletal: He exhibits tenderness.       Thoracic back: He exhibits tenderness and pain.       Lumbar back: He exhibits tenderness and pain.       Back:  Decreased range of motion of the T-spine and L-spine No tenderness to palpation of the spinous processes of the T-spine or L-spine No midline tenderness. Bilateral paraspinal t-spine and l-spine tenderness.  Lymphadenopathy:    He has no cervical adenopathy.  Neurological: He is alert. He has normal reflexes.  Speech is clear and goal oriented, follows commands Normal strength in upper and lower extremities bilaterally including dorsiflexion and plantar flexion, strong and equal grip strength Sensation normal to light and sharp touch Moves extremities without ataxia, coordination intact Normal gait Normal balance Positive Left SLR.  Skin: Skin is warm and dry. No rash noted. He is not diaphoretic. No erythema.    ED Course  Procedures (including critical care time)  DIAGNOSTIC STUDIES: Oxygen Saturation is 98% on room air, normal by my interpretation.    COORDINATION OF CARE: 4:00 PM-Discussed treatment plan which includes Dilaudid and Robaxin with pt at bedside and pt agreed to plan.   4:06 PM - [place] prescription database reports pt has filled 1 month  prescription for 10 mg Oxycodone every month since 07/2012 through 11/2012. No prescription filled for 12/2012.   Labs Review Labs Reviewed - No data to display Imaging Review No results found.  EKG Interpretation   None      Meds ordered this encounter  Medications  . PRESCRIPTION MEDICATION    Sig: Take 2 tablets by mouth 3 (three) times daily with meals.  Marland Kitchen HYDROmorphone (DILAUDID) injection 1 mg    Sig:   . methocarbamol (ROBAXIN) injection 1,000 mg    Sig:   . methocarbamol (ROBAXIN) 500 MG tablet    Sig: Take 1 tablet (500 mg total) by mouth 2 (two) times daily  as needed.    Dispense:  20 tablet    Refill:  2    Order Specific Question:  Supervising Provider    Answer:  Eber Hong D [[id number]]  . oxyCODONE-acetaminophen (PERCOCET/ROXICET) 5-325 MG per tablet    Sig: Take 2 tablets by mouth every 4 (four) hours as needed for pain.    Dispense:  21 tablet    Refill:  0    Order Specific Question:  Supervising Provider    Answer:  Eber Hong D [[id number]]     MDM   1. Acute exacerbation of chronic low back pain     Nocholas B Gavitt resents with acute exacerbation of his chronic back pain. Patient's back pain is nontraumatic.  He sees a pain management clinic but missed his October appointment.  I have reviewed in [place] prescription drug database and there is no evidence of apparent medication usage at this time. Patient has not been seen often the emergency room for pain complaints. I will refill his oxycodone for 7 days until he can be seen by the pain clinic on November 6. Patient given pain control and muscle relaxer here with significant improvement.  No neurological deficits and normal neuro exam.  Patient can walk but states is painful.  No loss of bowel or bladder control.  No concern for cauda equina.  No fever, night sweats, weight loss, h/o cancer, IVDU.  RICE protocol discussed with patient.    It has been determined that no acute conditions requiring  further emergency intervention are present at this time. The patient/guardian have been advised of the diagnosis and plan. We have discussed signs and symptoms that warrant return to the ED, such as changes or worsening in symptoms.   Vital signs are stable at discharge.   BP 120/56  Pulse 103  Temp(Src) 97.9 F (36.6 C) (Oral)  Resp 18  Ht 6' (1.829 m)  Wt 347 lb 3.6 oz (157.5 kg)  BMI 47.08 kg/m2  SpO2 98%  Patient/guardian has voiced understanding and agreed to follow-up with the PCP or specialist.    I personally performed the services described in this documentation, which was scribed in my presence. The recorded information has been reviewed and is accurate.   Dahlia Client Veatrice Eckstein, PA-C 01/19/13 1714

## 2013-01-19 NOTE — ED Notes (Signed)
Pt c/o lower to mid back pain that is chronic in nature; pt sts missed pain clinic appointment and out of pain meds; pt sts pain radiates down legs

## 2013-01-20 NOTE — ED Provider Notes (Signed)
Medical screening examination/treatment/procedure(s) were performed by non-physician practitioner and as supervising physician I was immediately available for consultation/collaboration.  EKG Interpretation   None         Emmy Keng T Sruti Ayllon, MD 01/20/13 0128 

## 2013-05-20 ENCOUNTER — Emergency Department (HOSPITAL_COMMUNITY)
Admission: EM | Admit: 2013-05-20 | Discharge: 2013-05-20 | Disposition: A | Discharge status: Home / Self Care | Payer: Medicare Other | Attending: Emergency Medicine | Admitting: Emergency Medicine

## 2013-05-20 ENCOUNTER — Encounter (HOSPITAL_COMMUNITY): Payer: Self-pay | Admitting: Emergency Medicine

## 2013-05-20 DIAGNOSIS — F17200 Nicotine dependence, unspecified, uncomplicated: Secondary | ICD-10-CM | POA: Insufficient documentation

## 2013-05-20 DIAGNOSIS — L0231 Cutaneous abscess of buttock: Secondary | ICD-10-CM | POA: Insufficient documentation

## 2013-05-20 DIAGNOSIS — Z792 Long term (current) use of antibiotics: Secondary | ICD-10-CM | POA: Insufficient documentation

## 2013-05-20 DIAGNOSIS — L0291 Cutaneous abscess, unspecified: Secondary | ICD-10-CM

## 2013-05-20 DIAGNOSIS — J45909 Unspecified asthma, uncomplicated: Secondary | ICD-10-CM | POA: Insufficient documentation

## 2013-05-20 DIAGNOSIS — Z992 Dependence on renal dialysis: Secondary | ICD-10-CM | POA: Insufficient documentation

## 2013-05-20 DIAGNOSIS — M129 Arthropathy, unspecified: Secondary | ICD-10-CM | POA: Insufficient documentation

## 2013-05-20 DIAGNOSIS — Z79899 Other long term (current) drug therapy: Secondary | ICD-10-CM | POA: Insufficient documentation

## 2013-05-20 DIAGNOSIS — I120 Hypertensive chronic kidney disease with stage 5 chronic kidney disease or end stage renal disease: Secondary | ICD-10-CM | POA: Insufficient documentation

## 2013-05-20 DIAGNOSIS — N186 End stage renal disease: Secondary | ICD-10-CM | POA: Insufficient documentation

## 2013-05-20 DIAGNOSIS — Z880 Allergy status to penicillin: Secondary | ICD-10-CM | POA: Insufficient documentation

## 2013-05-20 DIAGNOSIS — L03317 Cellulitis of buttock: Principal | ICD-10-CM

## 2013-05-20 DIAGNOSIS — Z87828 Personal history of other (healed) physical injury and trauma: Secondary | ICD-10-CM | POA: Insufficient documentation

## 2013-05-20 MED ORDER — SULFAMETHOXAZOLE-TRIMETHOPRIM 800-160 MG PO TABS: 1.0000 | ORAL_TABLET | Freq: Two times a day (BID) | ORAL | Status: DC

## 2013-05-20 MED ORDER — OXYCODONE-ACETAMINOPHEN 5-325 MG PO TABS
1.0000 | ORAL_TABLET | Freq: Once | ORAL | Status: AC
  Administered 2013-05-20: 1 via ORAL
  Filled 2013-05-20: qty 1

## 2013-05-20 MED ORDER — OXYCODONE-ACETAMINOPHEN 5-325 MG PO TABS
2.0000 | ORAL_TABLET | Freq: Once | ORAL | Status: AC
  Administered 2013-05-20: 2 via ORAL
  Filled 2013-05-20: qty 2

## 2013-05-20 MED ORDER — OXYCODONE-ACETAMINOPHEN 5-325 MG PO TABS: 1.0000 | ORAL_TABLET | ORAL | Status: DC | PRN

## 2013-05-20 MED ORDER — ACETAMINOPHEN 325 MG PO TABS
325.0000 mg | ORAL_TABLET | Freq: Once | ORAL | Status: AC
  Administered 2013-05-20: 325 mg via ORAL

## 2013-05-20 NOTE — Discharge Instructions (Signed)
Abscess An abscess is an infected area that contains a collection of pus and debris.It can occur in almost any part of the body. An abscess is also known as a furuncle or boil. CAUSES  An abscess occurs when tissue gets infected. This can occur from blockage of oil or sweat glands, infection of hair follicles, or a minor injury to the skin. As the body tries to fight the infection, pus collects in the area and creates pressure under the skin. This pressure causes pain. People with weakened immune systems have difficulty fighting infections and get certain abscesses more often.  SYMPTOMS Usually an abscess develops on the skin and becomes a painful mass that is red, warm, and tender. If the abscess forms under the skin, you may feel a moveable soft area under the skin. Some abscesses break open (rupture) on their own, but most will continue to get worse without care. The infection can spread deeper into the body and eventually into the bloodstream, causing you to feel ill.  DIAGNOSIS  Your caregiver will take your medical history and perform a physical exam. A sample of fluid may also be taken from the abscess to determine what is causing your infection. TREATMENT  Your caregiver may prescribe antibiotic medicines to fight the infection. However, taking antibiotics alone usually does not cure an abscess. Your caregiver may need to make a small cut (incision) in the abscess to drain the pus. In some cases, gauze is packed into the abscess to reduce pain and to continue draining the area. HOME CARE INSTRUCTIONS   Only take over-the-counter or prescription medicines for pain, discomfort, or fever as directed by your caregiver.  If you were prescribed antibiotics, take them as directed. Finish them even if you start to feel better.  If gauze is used, follow your caregiver's directions for changing the gauze.  To avoid spreading the infection:  Keep your draining abscess covered with a  bandage.  Wash your hands well.  Do not share personal care items, towels, or whirlpools with others.  Avoid skin contact with others.  Keep your skin and clothes clean around the abscess.  Keep all follow-up appointments as directed by your caregiver. SEEK MEDICAL CARE IF:   You have increased pain, swelling, redness, fluid drainage, or bleeding.  You have muscle aches, chills, or a general ill feeling.  You have a fever. MAKE SURE YOU:   Understand these instructions.  Will watch your condition.  Will get help right away if you are not doing well or get worse. Document Released: 12/18/2004 Document Revised: 09/09/2011 Document Reviewed: 05/23/2011 [employer] Patient Information 2014 [employer].  

## 2013-05-20 NOTE — ED Notes (Signed)
Presents with right induration to buttock at fold of buttock, denies drainage. Dialysis pt. Reports pain to right buttock. Alert and orieitned. Induration began a few days ago.

## 2013-05-24 NOTE — ED Provider Notes (Signed)
CSN: [id number]     Arrival date & time 05/20/13  1550 History   First MD Initiated Contact with Patient 05/20/13 1828     Chief Complaint  Patient presents with  . Recurrent Skin Infections     (Consider location/radiation/quality/duration/timing/severity/associated sxs/prior Treatment) Patient is a 38 y.o. male presenting with abscess. The history is provided by the patient. No language interpreter was used.  Abscess Location:  Ano-genital Ano-genital abscess location:  R buttock, perineum and L buttock Red streaking: no   Duration:  3 days Associated symptoms: no fever   Associated symptoms comment:  He complains of recurrent abscess to inferior right buttock and new abscess on left buttock near the anus. No active drainage. He denies fever. He has a history of ESRD-HD and reports he is compliant with all treatments.    Past Medical History  Diagnosis Date  . Hypertension   . Renal disorder   . Asthma     Hx: of as a child  . Sleep apnea   . Headache(784.0)   . Arthritis   . Back pain     Hx: of back injury   Past Surgical History  Procedure Laterality Date  . Dg av dialysis  shunt access exist*l* or    . Appendectomy    . Bascilic vein transposition Right 07/22/2012    Procedure: BASCILIC VEIN TRANSPOSITION;  Surgeon: Chuck Hint, MD;  Location: [facility] OR;  Service: Vascular;  Laterality: Right;   Family History  Problem Relation Age of Onset  . Hypertension Mother   . Peripheral vascular disease Mother   . Diabetes Father   . Hyperlipidemia Father   . Hypertension Father   . Peripheral vascular disease Maternal Grandmother    History  Substance Use Topics  . Smoking status: Current Some Day Smoker -- 0.50 packs/day    Types: Cigarettes  . Smokeless tobacco: Never Used  . Alcohol Use: No    Review of Systems  Constitutional: Negative for fever.  Gastrointestinal: Negative for constipation.  Musculoskeletal: Negative.   Skin:       See HPI.       Allergies  Penicillins  Home Medications   Current Outpatient Rx  Name  Route  Sig  Dispense  Refill  . cinacalcet (SENSIPAR) 90 MG tablet   Oral   Take 180 mg by mouth daily.          . multivitamin (RENA-VIT) TABS tablet   Oral   Take 1 tablet by mouth daily.           Marland Kitchen PRESCRIPTION MEDICATION   Oral   Take 2 tablets by mouth 3 (three) times daily with meals.         Marland Kitchen oxyCODONE-acetaminophen (PERCOCET/ROXICET) 5-325 MG per tablet   Oral   Take 1-2 tablets by mouth every 4 (four) hours as needed for severe pain.   15 tablet   0   . sulfamethoxazole-trimethoprim (SEPTRA DS) 800-160 MG per tablet   Oral   Take 1 tablet by mouth 2 (two) times daily.   20 tablet   0    BP 125/78  Pulse 96  Temp(Src) 98 F (36.7 C) (Oral)  Resp 18  SpO2 98% Physical Exam  Constitutional: He appears well-developed and well-nourished. No distress.  Skin:  Fluctuant and tender area to inferior right buttock without swelling.  Healed incisional scar assumed to be from previous I&D of abscess. Findings c/w recurrent cutaneous abscess. Left buttock at gluteal fold with small  tender area of induration and active purulent drainage. No rectal tenderness - doubt perirectal involvement.     ED Course  Procedures (including critical care time) Labs Review Labs Reviewed - No data to display Imaging Review No results found.   EKG Interpretation None      MDM   Final diagnoses:  Cutaneous abscess    1. Recurrent abcess, right buttock 2. Abscess left buttock  INCISION AND DRAINAGE Performed by: Elpidio AnisUPSTILL, Chantella Creech A Consent: Verbal consent obtained. Risks and benefits: risks, benefits and alternatives were discussed Type: abscess  Body area: right buttock  Anesthesia: local infiltration  Incision was made with a scalpel.  Local anesthetic: lidocaine 2% w/ epinephrine  Anesthetic total: 2 ml  Complexity: complex Blunt dissection to break up  loculations  Drainage: purulent  Drainage amount: large  Packing material: 1/4 in iodoform gauze  Patient tolerance: Patient tolerated the procedure well with no immediate complications.       Arnoldo HookerShari A Dominik Lauricella, PA-C 05/24/13 1054

## 2013-06-01 NOTE — ED Provider Notes (Signed)
Medical screening examination/treatment/procedure(s) were performed by non-physician practitioner and as supervising physician I was immediately available for consultation/collaboration.   EKG Interpretation None        Gavin PoundMichael Y. Oletta LamasGhim, MD 06/01/13 2032

## 2013-07-19 ENCOUNTER — Ambulatory Visit (INDEPENDENT_AMBULATORY_CARE_PROVIDER_SITE_OTHER): Payer: Medicare Other | Admitting: Family Medicine

## 2013-07-19 VITALS — BP 160/100 | HR 84 | Temp 98.1°F | Resp 16 | Ht 72.0 in | Wt 368.2 lb

## 2013-07-19 DIAGNOSIS — H1013 Acute atopic conjunctivitis, bilateral: Secondary | ICD-10-CM

## 2013-07-19 DIAGNOSIS — H1045 Other chronic allergic conjunctivitis: Secondary | ICD-10-CM

## 2013-07-19 MED ORDER — OLOPATADINE HCL 0.1 % OP SOLN: 1.0000 [drp] | Freq: Two times a day (BID) | OPHTHALMIC | Status: DC

## 2013-07-19 MED ORDER — CETIRIZINE HCL 10 MG PO TABS
10.0000 mg | ORAL_TABLET | Freq: Every day | ORAL | Status: DC
Start: 2013-07-19 — End: 2014-08-31

## 2013-07-19 NOTE — Progress Notes (Signed)
Subjective:  This chart was scribed for Norberto SorensonEva Shaw, MD by Beverly MilchJ Harrison Collins, ED Scribe. This patient was seen in room 11 and the patient's care was started at 8:36 AM.   Patient ID: Bradley [name], male    DOB: 04/07/1975, 38 y.o.   MRN: [id number]   Chief Complaint  Patient presents with  . Eye Pain    Red - B; Left is worse x yesterday    HPI HPI Comments: Bradley [name] is a 38 y.o. male with a family h/o glaucoma who presents to the [facility] complaining of pain in his left eye that began yesterday. He states it spread to his right eye this morning. He reports associated nasal pressure, photophobia, headaches, and redness around the eye. Pt denies drainage, tearing, vision change, sore throat, and dry cough. Pt denies sick contacts with conjunctivitis. He states he cannot remember the last time he saw an eye doctor. Pt states he does not wear contacts but does wear glasses. Pt denies using any eye drops to alleviate pain and redness.  Pt states he is on dialysis and goes 3 times a week, usually Monday, Wednesday, and Friday. Pt reports his kidney disease is from high blood pressure. Pt denies DM.  Pt states his mother's side of the family has a h/o glaucoma and that most of his relatives have "died blind."   Past Medical History  Diagnosis Date  . Hypertension   . Renal disorder   . Asthma     Hx: of as a child  . Sleep apnea   . Headache(784.0)   . Arthritis   . Back pain     Hx: of back injury    Current Outpatient Prescriptions on File Prior to Visit  Medication Sig Dispense Refill  . cinacalcet (SENSIPAR) 90 MG tablet Take 180 mg by mouth daily.       . multivitamin (RENA-VIT) TABS tablet Take 1 tablet by mouth daily.        Marland Kitchen. oxyCODONE-acetaminophen (PERCOCET/ROXICET) 5-325 MG per tablet Take 1-2 tablets by mouth every 4 (four) hours as needed for severe pain.  15 tablet  0  . PRESCRIPTION MEDICATION Take 2 tablets by mouth 3 (three) times daily  with meals.      Marland Kitchen. sulfamethoxazole-trimethoprim (SEPTRA DS) 800-160 MG per tablet Take 1 tablet by mouth 2 (two) times daily.  20 tablet  0   No current facility-administered medications on file prior to visit.    Allergies  Allergen Reactions  . Penicillins Anaphylaxis     Review of Systems  Constitutional: Negative for fever and chills.  HENT: Positive for congestion and sinus pressure. Negative for ear discharge, ear pain, hearing loss and rhinorrhea.   Eyes: Positive for photophobia, pain and redness. Negative for discharge and visual disturbance.  Respiratory: Negative for apnea.   Cardiovascular: Negative for chest pain.  Gastrointestinal: Negative for nausea, vomiting, abdominal pain and diarrhea.  Genitourinary: Negative for dysuria and flank pain.  Neurological: Positive for headaches.       Objective:   Physical Exam  Nursing note and vitals reviewed. Constitutional: He is oriented to person, place, and time. He appears well-nourished.  HENT:  Head: Normocephalic and atraumatic.  Right Ear: There is swelling. Tympanic membrane is injected and erythematous. A middle ear effusion is present.  Left Ear: There is swelling. Tympanic membrane is erythematous. A middle ear effusion is present.  Mucosal edema and rhinorrhea  Eyes: EOM are normal.  Pupils are equal, round, and reactive to light. Right conjunctiva is injected. Left conjunctiva is injected.  conjuctiva mildly injected and effusive bilaterally. Fundoscopic exam difficult to complete unable to visualize retina accurately.  Neck: Normal range of motion. Neck supple. No tracheal deviation present. No thyromegaly present.  Cardiovascular: Normal rate, regular rhythm and normal heart sounds.  Exam reveals no gallop and no friction rub.   No murmur heard. Pulmonary/Chest: Effort normal and breath sounds normal. No respiratory distress. He has no wheezes. He has no rales.  Musculoskeletal: Normal range of motion.    Neurological: He is alert and oriented to person, place, and time. No cranial nerve deficit. Coordination normal.  Skin: Skin is warm and dry.  Psychiatric: He has a normal mood and affect. His behavior is normal.     Triage Vitals: BP 160/100  Pulse 84  Temp(Src) 98.1 F (36.7 C) (Oral)  Resp 16  Ht 6' (1.829 m)  Wt 368 lb 3.2 oz (167.014 kg)  BMI 49.93 kg/m2  SpO2 100%       Visual Acuity Screening   Right eye Left eye Both eyes  Without correction:     With correction: 20/20 20/20 20/20    Assessment & Plan:    DIAGNOSTIC STUDIES: Oxygen Saturation is 100% on RA, normal by my interpretation.     COORDINATION OF CARE: Pt advised of plan for treatment and pt agrees. Discussed importance of reg visits to eye doctor  due to family h/o glaucoma. Sched OV but seek immed eval if sxs do improve w/ antihistamine trx or develops any vision change.  Allergic conjunctivitis of both eyes  Meds ordered this encounter  Medications  . sucroferric oxyhydroxide (VELPHORO) 500 MG chewable tablet    Sig: Chew 500 mg by mouth 3 (three) times daily with meals.  Marland Kitchen. olopatadine (PATANOL) 0.1 % ophthalmic solution    Sig: Place 1 drop into both eyes 2 (two) times daily.    Dispense:  5 mL    Refill:  0  . cetirizine (ZYRTEC) 10 MG tablet    Sig: Take 1 tablet (10 mg total) by mouth daily.    Dispense:  30 tablet    Refill:  1    I personally performed the services described in this documentation, which was scribed in my presence. The recorded information has been reviewed and considered, and addended by me as needed.  Norberto SorensonEva Shaw, MD MPH

## 2013-07-19 NOTE — Patient Instructions (Signed)

## 2013-11-01 ENCOUNTER — Emergency Department (INDEPENDENT_AMBULATORY_CARE_PROVIDER_SITE_OTHER)
Admission: EM | Admit: 2013-11-01 | Discharge: 2013-11-01 | Disposition: A | Discharge status: Home / Self Care | Payer: Medicare Other | Source: Home / Self Care | Attending: Family Medicine | Admitting: Family Medicine

## 2013-11-01 ENCOUNTER — Encounter (HOSPITAL_COMMUNITY): Payer: Self-pay | Admitting: Emergency Medicine

## 2013-11-01 DIAGNOSIS — L02419 Cutaneous abscess of limb, unspecified: Secondary | ICD-10-CM

## 2013-11-01 DIAGNOSIS — L03119 Cellulitis of unspecified part of limb: Secondary | ICD-10-CM

## 2013-11-01 DIAGNOSIS — L02415 Cutaneous abscess of right lower limb: Secondary | ICD-10-CM

## 2013-11-01 NOTE — ED Provider Notes (Addendum)
CSN: [id number]     Arrival date & time 11/01/13  1210 History   First MD Initiated Contact with Patient 11/01/13 1232     Chief Complaint  Patient presents with  . Recurrent Skin Infections   (Consider location/radiation/quality/duration/timing/severity/associated sxs/prior Treatment) Patient is a 38 y.o. male presenting with rash.  Rash Location:  Leg Leg rash location:  R upper leg Quality: painful, redness and swelling   Pain details:    Onset quality:  Sudden   Duration:  1 day   Progression:  Worsening Severity:  Mild Progression:  Worsening Chronicity:  Chronic Relieved by:  None tried Worsened by:  Nothing tried Ineffective treatments:  None tried Associated symptoms: no fever     Past Medical History  Diagnosis Date  . Hypertension   . Renal disorder   . Asthma     Hx: of as a child  . Sleep apnea   . Headache(784.0)   . Arthritis   . Back pain     Hx: of back injury   Past Surgical History  Procedure Laterality Date  . Dg av dialysis  shunt access exist*l* or    . Appendectomy    . Bascilic vein transposition Right 07/22/2012    Procedure: BASCILIC VEIN TRANSPOSITION;  Surgeon: Chuck Hint, MD;  Location: [facility] OR;  Service: Vascular;  Laterality: Right;   Family History  Problem Relation Age of Onset  . Hypertension Mother   . Peripheral vascular disease Mother   . Diabetes Father   . Hyperlipidemia Father   . Hypertension Father   . Peripheral vascular disease Maternal Grandmother    History  Substance Use Topics  . Smoking status: Current Some Day Smoker -- 0.50 packs/day    Types: Cigarettes  . Smokeless tobacco: Never Used  . Alcohol Use: No    Review of Systems  Constitutional: Negative.  Negative for fever.  Skin: Positive for rash.    Allergies  Penicillins  Home Medications   Prior to Admission medications   Medication Sig Start Date End Date Taking? Authorizing Provider  cetirizine (ZYRTEC) 10 MG tablet Take 1 tablet  (10 mg total) by mouth daily. 07/19/13   Sherren Mocha, MD  cinacalcet (SENSIPAR) 90 MG tablet Take 180 mg by mouth daily.     Historical Provider, MD  multivitamin (RENA-VIT) TABS tablet Take 1 tablet by mouth daily.      Historical Provider, MD  olopatadine (PATANOL) 0.1 % ophthalmic solution Place 1 drop into both eyes 2 (two) times daily. 07/19/13   Sherren Mocha, MD  oxyCODONE-acetaminophen (PERCOCET/ROXICET) 5-325 MG per tablet Take 1-2 tablets by mouth every 4 (four) hours as needed for severe pain. 05/20/13   Shari A Upstill, PA-C  PRESCRIPTION MEDICATION Take 2 tablets by mouth 3 (three) times daily with meals.    Historical Provider, MD  sucroferric oxyhydroxide (VELPHORO) 500 MG chewable tablet Chew 500 mg by mouth 3 (three) times daily with meals.    Historical Provider, MD  sulfamethoxazole-trimethoprim (SEPTRA DS) 800-160 MG per tablet Take 1 tablet by mouth 2 (two) times daily. 05/20/13   Shari A Upstill, PA-C   BP 158/108  Pulse 78  Temp(Src) 99.4 F (37.4 C) (Oral)  Resp 20  SpO2 100% Physical Exam  Nursing note and vitals reviewed. Constitutional: He is oriented to person, place, and time. He appears well-developed and well-nourished.  Musculoskeletal: He exhibits tenderness.  Neurological: He is alert and oriented to person, place, and time.  Skin: Skin  is warm and dry. Rash noted. There is erythema.  Pustular skin lesion to right prox medial thigh, ,min induration or erythema.    ED Course  INCISION AND DRAINAGE Date/Time: 11/01/2013 12:49 PM Performed by: Linna HoffKINDL, JAMES D Authorized by: Bradd CanaryKINDL, JAMES D Consent: Verbal consent obtained. Consent given by: patient Type: abscess Body area: lower extremity Location details: right leg Local anesthetic: topical anesthetic Patient sedated: no Scalpel size: 11 Incision type: single straight Complexity: simple Drainage: purulent Drainage amount: scant Wound treatment: wound left open Patient tolerance: Patient tolerated the  procedure well with no immediate complications.   (including critical care time) Labs Review Labs Reviewed - No data to display  Imaging Review No results found.   MDM   1. Abscess of right thigh        Linna HoffJames D Kindl, MD 11/01/13 1249  Linna HoffJames D Kindl, MD 11/01/13 [phone]

## 2013-11-01 NOTE — ED Notes (Signed)
Wound care.

## 2013-11-01 NOTE — ED Notes (Signed)
Pt  Has  A  Boil  r  Upper  Thigh  Area  That  He  Noticed  Yesterday         -  He  Has  Pain on palpation to    The  Affected  Thigh        He  Is a  Dialysis  Pt  With a  Shunt  In  r  Arm

## 2013-11-01 NOTE — Discharge Instructions (Signed)
Warm compress twice a day and apply ointment. return as needed.

## 2013-11-04 LAB — CULTURE, ROUTINE-ABSCESS

## 2013-11-04 NOTE — ED Notes (Addendum)
Abscess culture R leg: Few Staph species (coagulase neg.)  Treated with I and D.  Message to Dr. Artis FlockKindl. Vassie MoselleYork, Marletta Bousquet M 11/04/2013 I called Dr. Artis FlockKindl and he said " it should be fine." Vassie MoselleYork, Sayana Salley M 11/14/2013

## 2014-08-06 ENCOUNTER — Encounter (HOSPITAL_COMMUNITY): Payer: Self-pay | Admitting: Emergency Medicine

## 2014-08-06 ENCOUNTER — Emergency Department (HOSPITAL_COMMUNITY)
Admission: EM | Admit: 2014-08-06 | Discharge: 2014-08-06 | Disposition: A | Discharge status: Home / Self Care | Payer: Medicare Other | Attending: Emergency Medicine | Admitting: Emergency Medicine

## 2014-08-06 DIAGNOSIS — Z79899 Other long term (current) drug therapy: Secondary | ICD-10-CM | POA: Insufficient documentation

## 2014-08-06 DIAGNOSIS — Z720 Tobacco use: Secondary | ICD-10-CM | POA: Insufficient documentation

## 2014-08-06 DIAGNOSIS — Z880 Allergy status to penicillin: Secondary | ICD-10-CM | POA: Diagnosis not present

## 2014-08-06 DIAGNOSIS — J45909 Unspecified asthma, uncomplicated: Secondary | ICD-10-CM | POA: Diagnosis not present

## 2014-08-06 DIAGNOSIS — Z452 Encounter for adjustment and management of vascular access device: Secondary | ICD-10-CM | POA: Diagnosis present

## 2014-08-06 DIAGNOSIS — I120 Hypertensive chronic kidney disease with stage 5 chronic kidney disease or end stage renal disease: Secondary | ICD-10-CM | POA: Diagnosis not present

## 2014-08-06 DIAGNOSIS — N186 End stage renal disease: Secondary | ICD-10-CM | POA: Insufficient documentation

## 2014-08-06 DIAGNOSIS — M1990 Unspecified osteoarthritis, unspecified site: Secondary | ICD-10-CM | POA: Diagnosis not present

## 2014-08-06 DIAGNOSIS — T82898A Other specified complication of vascular prosthetic devices, implants and grafts, initial encounter: Secondary | ICD-10-CM | POA: Insufficient documentation

## 2014-08-06 DIAGNOSIS — Z87828 Personal history of other (healed) physical injury and trauma: Secondary | ICD-10-CM | POA: Diagnosis not present

## 2014-08-06 DIAGNOSIS — Z992 Dependence on renal dialysis: Secondary | ICD-10-CM | POA: Diagnosis not present

## 2014-08-06 DIAGNOSIS — Z8669 Personal history of other diseases of the nervous system and sense organs: Secondary | ICD-10-CM | POA: Diagnosis not present

## 2014-08-06 DIAGNOSIS — T829XXA Unspecified complication of cardiac and vascular prosthetic device, implant and graft, initial encounter: Secondary | ICD-10-CM

## 2014-08-06 NOTE — Discharge Instructions (Signed)
Please follow up as directed by Dr. Lacy DuverneyGoldsboro.   Dialysis Vascular Access Malfunction A vascular access is an entrance to your blood vessels that can be used for dialysis. A vascular access can be made in one of several ways:   Joining an artery to a vein under your skin to make a bigger blood vessel called a fistula.   Joining an artery to a vein under your skin using a soft tube called a graft.   Placing a thin, flexible tube (catheter) in a large vein, usually in your neck.  A vascular access may malfunction or become blocked.  WHAT CAN CAUSE YOUR VASCULAR ACCESS TO MALFUNCTION?  Infection (common).   A blood clot inside a part of the fistula, graft, or catheter. A blood clot can completely or partially block the flow of blood.   A kink in the graft or catheter.   A collection of blood (called a hematoma or bruise) next to the graft or catheter that pushes against it, blocking the flow of blood.  WHAT ARE SIGNS AND SYMPTOMS OF VASCULAR ACCESS MALFUNCTION?  There is a change in the vibration or pulse of your fistula or graft.  The vibration or pulse of your fistula or graft is gone.   There is new or unusual swelling of the area around the access.   There was an unsuccessful puncture of your access by the dialysis team.   The flow of blood through the fistula, graft, or catheter is too slow for effective dialysis.   When routine dialysis is completed and the needle is removed, bleeding lasts for too long a time.  WHAT HAPPENS IF MY VASCULAR ACCESS MALFUNCTIONS? Your health care provider may order blood work, cultures, or an X-ray test in order to learn what may be wrong with your vascular access. The X-ray test involves the injection of a liquid into the vascular access. The liquid shows up on the X-ray and allows your health care provider to see if there is a blockage in the vascular access.  Treatment varies depending on the cause of the malfunction:   If the  vascular access is infected, your health care provider may prescribe antibiotic medicine to control the infection.   If a clot is found in the vascular access, you may need surgery to remove the clot.   If a blockage in the vascular access is due to some other cause (such as a kink in a graft), then you will likely need surgery to unblock or replace the graft.  HOME CARE INSTRUCTIONS: Follow up with your surgeon or other health care provider if you were instructed to do so. This is very important. Any delay in follow-up could cause permanent dysfunction of the vascular access, which may be dangerous.  SEEK MEDICAL CARE IF:   Fever develops.   Swelling and pain around the vascular access gets worse or new pain develops.  Pain, numbness, or an unusual pale skin color develops in the hand on the side of your vascular access. SEEK IMMEDIATE MEDICAL CARE IF: Unusual bleeding develops at the location of the vascular access. MAKE SURE YOU:  Understand these instructions.  Will watch your condition.  Will get help right away if you are not doing well or get worse. Document Released: 02/10/2006 Document Revised: 07/25/2013 Document Reviewed: 08/12/2012 [facility] Patient Information 2015 [employer] [place]. This information is not intended to replace advice given to you by your health care provider. Make sure you discuss any questions you have with  your health care provider. ° °

## 2014-08-06 NOTE — ED Notes (Signed)
Pt remains monitored by blood pressure and pulse ox.  

## 2014-08-06 NOTE — ED Notes (Signed)
Pt. Stated, I tried to feel my fistula this morning and cold not feel the bruie. I m afraid its clogged. Dialysis is tomorrow.

## 2014-08-06 NOTE — ED Provider Notes (Signed)
CSN: [id number]     Arrival date & time 08/06/14  1218 History   First MD Initiated Contact with Patient 08/06/14 1225     Chief Complaint  Patient presents with  . Vascular Access Problem     (Consider location/radiation/quality/duration/timing/severity/associated sxs/prior Treatment) HPI   Bradley [name] Is a 39 year old male with past medical history of hypertension and end-stage renal disease who presents the emergency department with chief complaint of dialysis graft without thrill. Patient dialyzes Monday, Wednesday and Friday for 8 hours. The patient dialyzed the night before last. This morning he awoke and found that his right upper extremity fistula was without thrill. The patient is upset because this has happened before and he had to have a catheter placed. The patient will  Past Medical History  Diagnosis Date  . Hypertension   . Renal disorder   . Asthma     Hx: of as a child  . Sleep apnea   . Headache(784.0)   . Arthritis   . Back pain     Hx: of back injury   Past Surgical History  Procedure Laterality Date  . Dg av dialysis  shunt access exist*l* or    . Appendectomy    . Bascilic vein transposition Right 07/22/2012    Procedure: BASCILIC VEIN TRANSPOSITION;  Surgeon: [name], MD;  [facility];  Service: Vascular;  Laterality: Right;   Family History  Problem Relation Age of Onset  . Hypertension Mother   . Peripheral vascular disease Mother   . Diabetes Father   . Hyperlipidemia Father   . Hypertension Father   . Peripheral vascular disease Maternal Grandmother    History  Substance Use Topics  . Smoking status: Current Some Day Smoker -- 0.50 packs/day    Types: Cigarettes  . Smokeless tobacco: Never Used  . Alcohol Use: No    Review of Systems Ten systems reviewed and are negative for acute change, except as noted in the HPI.     Allergies  Penicillins  Home Medications   Prior to Admission medications   Medication Sig  Start Date End Date Taking? Authorizing Provider  cetirizine (ZYRTEC) 10 MG tablet Take 1 tablet (10 mg total) by mouth daily. 07/19/13   Sherren MochaEva N Shaw, MD  cinacalcet (SENSIPAR) 90 MG tablet Take 180 mg by mouth daily.     Historical Provider, MD  multivitamin (RENA-VIT) TABS tablet Take 1 tablet by mouth daily.      Historical Provider, MD  olopatadine (PATANOL) 0.1 % ophthalmic solution Place 1 drop into both eyes 2 (two) times daily. 07/19/13   Sherren MochaEva N Shaw, MD  oxyCODONE-acetaminophen (PERCOCET/ROXICET) 5-325 MG per tablet Take 1-2 tablets by mouth every 4 (four) hours as needed for severe pain. 05/20/13   Elpidio AnisShari Upstill, PA-C  PRESCRIPTION MEDICATION Take 2 tablets by mouth 3 (three) times daily with meals.    Historical Provider, MD  sucroferric oxyhydroxide (VELPHORO) 500 MG chewable tablet Chew 500 mg by mouth 3 (three) times daily with meals.    Historical Provider, MD  sulfamethoxazole-trimethoprim (SEPTRA DS) 800-160 MG per tablet Take 1 tablet by mouth 2 (two) times daily. 05/20/13   Shari Upstill, PA-C   BP 130/83 mmHg  Pulse 100  Temp(Src) 98.3 F (36.8 C) (Oral)  Resp 18  Ht 6' (1.829 m)  Wt 335 lb 1.6 oz (152 kg)  BMI 45.44 kg/m2  SpO2 98% Physical Exam  Constitutional: He appears well-developed and well-nourished. No distress.  HENT:  Head:  Normocephalic and atraumatic.  Eyes: Conjunctivae are normal. No scleral icterus.  Neck: Normal range of motion. Neck supple.  Cardiovascular: Normal rate, regular rhythm and normal heart sounds.   Graft in RUE is without palpable thrill. No bruits noted  Pulmonary/Chest: Effort normal and breath sounds normal. No respiratory distress.  Abdominal: Soft. There is no tenderness.  Musculoskeletal: He exhibits no edema.  Neurological: He is alert.  Skin: Skin is warm and dry. He is not diaphoretic.  Psychiatric: His behavior is normal.  Nursing note and vitals reviewed.   ED Course  Procedures (including critical care time) Labs  Review Labs Reviewed - No data to display  Imaging Review No results found.   EKG Interpretation None      MDM   Final diagnoses:  None    I have spoken with Dr. Myra GianottiBrabham who has referred me to Nephrology. Dr. Criss AlvineGoldston asks that the patient follow up in the office tomorrow. She has spoken directly with the patient and givne him explicit follow up directions via phone here in the ED.  Patient appears safe for discharge.    Arthor Captainbigail Anelle Parlow, PA-C 08/08/14 1845  Blake DivineJohn Wofford, MD 08/09/14 [phone]

## 2014-08-08 ENCOUNTER — Ambulatory Visit (HOSPITAL_COMMUNITY)
Admission: RE | Admit: 2014-08-08 | Discharge: 2014-08-08 | Disposition: A | Discharge status: Home / Self Care | Payer: Medicare Other | Source: Ambulatory Visit | Attending: Nephrology | Admitting: Nephrology

## 2014-08-08 ENCOUNTER — Encounter (HOSPITAL_COMMUNITY): Payer: Self-pay

## 2014-08-08 ENCOUNTER — Other Ambulatory Visit (HOSPITAL_COMMUNITY): Payer: Self-pay | Admitting: Nephrology

## 2014-08-08 ENCOUNTER — Other Ambulatory Visit: Payer: Self-pay | Admitting: Radiology

## 2014-08-08 DIAGNOSIS — T82590A Other mechanical complication of surgically created arteriovenous fistula, initial encounter: Secondary | ICD-10-CM | POA: Insufficient documentation

## 2014-08-08 DIAGNOSIS — Z992 Dependence on renal dialysis: Secondary | ICD-10-CM | POA: Insufficient documentation

## 2014-08-08 DIAGNOSIS — G4733 Obstructive sleep apnea (adult) (pediatric): Secondary | ICD-10-CM | POA: Diagnosis not present

## 2014-08-08 DIAGNOSIS — F17210 Nicotine dependence, cigarettes, uncomplicated: Secondary | ICD-10-CM | POA: Diagnosis not present

## 2014-08-08 DIAGNOSIS — T82590D Other mechanical complication of surgically created arteriovenous fistula, subsequent encounter: Secondary | ICD-10-CM

## 2014-08-08 DIAGNOSIS — Y832 Surgical operation with anastomosis, bypass or graft as the cause of abnormal reaction of the patient, or of later complication, without mention of misadventure at the time of the procedure: Secondary | ICD-10-CM | POA: Insufficient documentation

## 2014-08-08 DIAGNOSIS — Z833 Family history of diabetes mellitus: Secondary | ICD-10-CM | POA: Insufficient documentation

## 2014-08-08 DIAGNOSIS — I120 Hypertensive chronic kidney disease with stage 5 chronic kidney disease or end stage renal disease: Secondary | ICD-10-CM | POA: Insufficient documentation

## 2014-08-08 DIAGNOSIS — Z8249 Family history of ischemic heart disease and other diseases of the circulatory system: Secondary | ICD-10-CM | POA: Diagnosis not present

## 2014-08-08 DIAGNOSIS — Z79899 Other long term (current) drug therapy: Secondary | ICD-10-CM | POA: Diagnosis not present

## 2014-08-08 DIAGNOSIS — N186 End stage renal disease: Secondary | ICD-10-CM | POA: Diagnosis not present

## 2014-08-08 LAB — BASIC METABOLIC PANEL
Anion gap: 20 — ABNORMAL HIGH (ref 5–15)
BUN: 71 mg/dL — ABNORMAL HIGH (ref 6–20)
CHLORIDE: 93 mmol/L — AB (ref 101–111)
CO2: 25 mmol/L (ref 22–32)
Calcium: 8.5 mg/dL — ABNORMAL LOW (ref 8.9–10.3)
Creatinine, Ser: 14.98 mg/dL — ABNORMAL HIGH (ref 0.61–1.24)
GFR calc Af Amer: 4 mL/min — ABNORMAL LOW (ref >60)
GFR calc non Af Amer: 4 mL/min — ABNORMAL LOW (ref >60)
GLUCOSE: 87 mg/dL (ref 65–99)
POTASSIUM: 4.5 mmol/L (ref 3.5–5.1)
Sodium: 138 mmol/L (ref 135–145)

## 2014-08-08 LAB — PROTIME-INR
INR: 1.07 (ref 0.00–1.49)
PROTHROMBIN TIME: 14.1 s (ref 11.6–15.2)

## 2014-08-08 LAB — CBC WITH DIFFERENTIAL/PLATELET
BASOS ABS: 0 10*3/uL (ref 0.0–0.1)
BASOS PCT: 0 % (ref 0–1)
Eosinophils Absolute: 0.3 10*3/uL (ref 0.0–0.7)
Eosinophils Relative: 5 % (ref 0–5)
HCT: 46.7 % (ref 39.0–52.0)
Hemoglobin: 15.5 g/dL (ref 13.0–17.0)
LYMPHS PCT: 12 % (ref 12–46)
Lymphs Abs: 0.7 10*3/uL (ref 0.7–4.0)
MCH: 30.3 pg (ref 26.0–34.0)
MCHC: 33.2 g/dL (ref 30.0–36.0)
MCV: 91.2 fL (ref 78.0–100.0)
Monocytes Absolute: 0.5 10*3/uL (ref 0.1–1.0)
Monocytes Relative: 8 % (ref 3–12)
NEUTROS ABS: 4.4 10*3/uL (ref 1.7–7.7)
NEUTROS PCT: 75 % (ref 43–77)
PLATELETS: 187 10*3/uL (ref 150–400)
RBC: 5.12 MIL/uL (ref 4.22–5.81)
RDW: 13.8 % (ref 11.5–15.5)
WBC: 5.8 10*3/uL (ref 4.0–10.5)

## 2014-08-08 MED ORDER — SODIUM CHLORIDE 0.9 % IV SOLN
INTRAVENOUS | Status: DC | PRN
  Administered 2014-08-08: 10 mL/h via INTRAVENOUS

## 2014-08-08 MED ORDER — MIDAZOLAM HCL 2 MG/2ML IJ SOLN
INTRAMUSCULAR | Status: AC
  Filled 2014-08-08: qty 4

## 2014-08-08 MED ORDER — MIDAZOLAM HCL 2 MG/2ML IJ SOLN
INTRAMUSCULAR | Status: DC | PRN
  Administered 2014-08-08: 0.5 mg via INTRAVENOUS
  Administered 2014-08-08: 1 mg via INTRAVENOUS
  Administered 2014-08-08: 2 mg via INTRAVENOUS
  Administered 2014-08-08: 0.5 mg via INTRAVENOUS

## 2014-08-08 MED ORDER — IOHEXOL 300 MG/ML  SOLN
50.0000 mL | Freq: Once | INTRAMUSCULAR | Status: AC | PRN
  Administered 2014-08-08: 10 mL via INTRAVENOUS

## 2014-08-08 MED ORDER — LIDOCAINE-EPINEPHRINE (PF) 1 %-1:200000 IJ SOLN
INTRAMUSCULAR | Status: AC
  Filled 2014-08-08: qty 10

## 2014-08-08 MED ORDER — HEPARIN SODIUM (PORCINE) 1000 UNIT/ML IJ SOLN
INTRAMUSCULAR | Status: AC
  Filled 2014-08-08: qty 1

## 2014-08-08 MED ORDER — FENTANYL CITRATE (PF) 100 MCG/2ML IJ SOLN
INTRAMUSCULAR | Status: AC
  Filled 2014-08-08: qty 2

## 2014-08-08 MED ORDER — FENTANYL CITRATE (PF) 100 MCG/2ML IJ SOLN
INTRAMUSCULAR | Status: DC | PRN
  Administered 2014-08-08: 50 ug via INTRAVENOUS
  Administered 2014-08-08: 25 ug via INTRAVENOUS
  Administered 2014-08-08 (×2): 12.5 ug via INTRAVENOUS

## 2014-08-08 MED ORDER — SODIUM CHLORIDE 0.9 % IV SOLN: INTRAVENOUS | Status: DC

## 2014-08-08 MED ORDER — SODIUM CHLORIDE 0.9 % IV SOLN
1500.0000 mg | INTRAVENOUS | Status: AC
  Administered 2014-08-08: 1500 mg via INTRAVENOUS
  Filled 2014-08-08: qty 1500

## 2014-08-08 MED ORDER — VANCOMYCIN HCL 10 G IV SOLR
1500.0000 mg | INTRAVENOUS | Status: DC
  Filled 2014-08-08: qty 1500

## 2014-08-08 NOTE — Procedures (Signed)
Successful placement of tunneled HD catheter with tips terminating within the superior aspect of the right atrium.   The patient tolerated the procedure well without immediate post procedural complication. The catheter is ready for immediate use.  

## 2014-08-08 NOTE — Sedation Documentation (Signed)
Ectopy with advancement of wire, resolved with removal of wire

## 2014-08-08 NOTE — Sedation Documentation (Signed)
HD site CDI, no swelling, no c/o pain.

## 2014-08-08 NOTE — H&P (Signed)
Chief Complaint: "My fistula is not working."  Referring Physician(s): Patel,Jay  History of Present Illness: Bradley [name] is a 39 y.o. male with ESRD on HD M/W/F, last successful HD Friday evening/sat am. The patient currently has a RUE fistula s/p unsuccessful declot on 08/07/14 by Dr. Allena KatzPatel with history of multiple angioplasties to site and history of multiple failed fistula placements in bilateral arms. Request for IR to place hemodialysis catheter today. The patient denies any chest pain, shortness of breath or palpitations. He denies any active signs of bleeding or excessive bruising. He denies any recent fever, chills or infections. He has previously tolerated sedation without complications.    Past Medical History  Diagnosis Date  . Hypertension   . Renal disorder   . Asthma     Hx: of as a child  . Sleep apnea   . Headache(784.0)   . Arthritis   . Back pain     Hx: of back injury    Past Surgical History  Procedure Laterality Date  . Dg av dialysis  shunt access exist*l* or    . Appendectomy    . Bascilic vein transposition Right 07/22/2012    Procedure: BASCILIC VEIN TRANSPOSITION;  Surgeon: [name], MD;  [facility];  Service: Vascular;  Laterality: Right;    Allergies: Penicillins  Medications: Prior to Admission medications   Medication Sig Start Date End Date Taking? Authorizing Provider  amLODipine (NORVASC) 5 MG tablet Take 5 mg by mouth daily.   Yes Historical Provider, MD  cinacalcet (SENSIPAR) 90 MG tablet Take 180 mg by mouth daily.    Yes Historical Provider, MD  multivitamin (RENA-VIT) TABS tablet Take 1 tablet by mouth daily.     Yes Historical Provider, MD  oxyCODONE-acetaminophen (PERCOCET/ROXICET) 5-325 MG per tablet Take 1-2 tablets by mouth every 4 (four) hours as needed for severe pain. 05/20/13  Yes Elpidio AnisShari Upstill, PA-C  sucroferric oxyhydroxide (VELPHORO) 500 MG chewable tablet Chew 500 mg by mouth 3 (three) times daily  with meals.   Yes Historical Provider, MD  cetirizine (ZYRTEC) 10 MG tablet Take 1 tablet (10 mg total) by mouth daily. Patient not taking: Reported on 08/08/2014 07/19/13   Sherren MochaEva N Shaw, MD  olopatadine (PATANOL) 0.1 % ophthalmic solution Place 1 drop into both eyes 2 (two) times daily. Patient not taking: Reported on 08/08/2014 07/19/13   Sherren MochaEva N Shaw, MD  PRESCRIPTION MEDICATION Take 2 tablets by mouth 3 (three) times daily with meals.    Historical Provider, MD  sulfamethoxazole-trimethoprim (SEPTRA DS) 800-160 MG per tablet Take 1 tablet by mouth 2 (two) times daily. Patient not taking: Reported on 08/08/2014 05/20/13   Elpidio AnisShari Upstill, PA-C     Family History  Problem Relation Age of Onset  . Hypertension Mother   . Peripheral vascular disease Mother   . Diabetes Father   . Hyperlipidemia Father   . Hypertension Father   . Peripheral vascular disease Maternal Grandmother     History   Social History  . Marital Status: Married    Spouse Name: N/A  . Number of Children: N/A  . Years of Education: N/A   Social History Main Topics  . Smoking status: Current Some Day Smoker -- 0.50 packs/day    Types: Cigarettes  . Smokeless tobacco: Never Used  . Alcohol Use: No  . Drug Use: No  . Sexual Activity: Not Currently   Other Topics Concern  . None   Social History Narrative  Review of Systems: A 12 point ROS discussed and pertinent positives are indicated in the HPI above.  All other systems are negative.  Review of Systems  Vital Signs: Pulse 98  Resp 16  SpO2 97%  Physical Exam  Constitutional: He is oriented to person, place, and time. No distress.  HENT:  Head: Normocephalic and atraumatic.  Neck: No tracheal deviation present.  Cardiovascular: Normal rate and regular rhythm.  Exam reveals no gallop and no friction rub.   No murmur heard. Pulmonary/Chest: Effort normal and breath sounds normal. No respiratory distress. He has no wheezes. He has no rales.  Abdominal:  Soft. Bowel sounds are normal. He exhibits no distension. There is no tenderness.  Neurological: He is alert and oriented to person, place, and time.  Skin: Skin is warm and dry. He is not diaphoretic.  Psychiatric: He has a normal mood and affect. His behavior is normal. Thought content normal.   Mallampati Score:  MD Evaluation Airway: WNL Heart: WNL Abdomen: WNL Chest/ Lungs: WNL ASA  Classification: 3 Mallampati/Airway Score: Two  Imaging: No results found.  Labs:  CBC:  Recent Labs  08/08/14 1030  WBC 5.8  HGB 15.5  HCT 46.7  PLT 187   COAGS: No results for input(s): INR, APTT in the last 8760 hours.  BMP: No results for input(s): NA, K, CL, CO2, GLUCOSE, BUN, CALCIUM, CREATININE, GFRNONAA, GFRAA in the last 8760 hours.  Invalid input(s): CMP  Assessment and Plan: ESRD on HD M/W/F, last successful HD Friday evening RUE fistula s/p unsuccessful declot 08/07/14 by Dr. Allena KatzPatel with history of multiple angioplasties to site, history of multiple failed fistula placements  Request for IR to place hemodialysis catheter today with moderate sedation for access The patient has been NPO, no blood thinners taken, labs and vitals have been reviewed. Risks and Benefits discussed with the patient including, but not limited to bleeding, infection, vascular injury, pneumothorax which may require chest tube placement, air embolism or even death All of the patient's questions were answered, patient is agreeable to proceed. Consent signed and in chart. OSA HTN  Thank you for this interesting consult.  I greatly enjoyed meeting Kasson B Mandrell and look forward to participating in their care.  SignedBerneta Levins: Murad Staples D 08/08/2014, 11:09 AM   I spent a total of 20 Minutes in face to face in clinical consultation, greater than 50% of which was counseling/coordinating care for ESRD with malfunctioning fistula.

## 2014-08-08 NOTE — Discharge Instructions (Addendum)
Tunneled Catheter Insertion °Catheters are thin, flexible tubes that are inserted into a vein to provide access to the bloodstream. A tunneled catheter is used when a person's bloodstream needs to be accessed many times over a long period, usually longer than 30 days. The catheter provides a painless method of drawing blood, giving blood products, removing waste products from the blood (hemodialysis), and giving medicines. Tunneled catheters can be placed in different parts of the body depending on how they will be used. These catheters are secure and easy to access. A part of the catheter is tunneled under the skin. This is done to decrease the risk of infection.  °There are various types of tunneled catheters. The specific one used will depend on your needs. The catheter can be used right after insertion. °LET YOUR HEALTH CARE PROVIDER KNOW ABOUT:  °· Any allergies you have. °· All medicines you are taking, including vitamins, herbs, eyedrops, and over-the-counter medicines and creams.   °· Previous problems you or members of your family have had with the use of anesthetics.   °· Any blood disorders you have had. °· Possibility of pregnancy, if this applies.   °· Other health problems you have. Also, let your health care provider know if you have a pacemaker. °RISKS AND COMPLICATIONS °Generally, tunneled catheter insertion is a safe procedure. However, as with any surgical procedure, complications can occur. Possible complications include: °· Damage to the blood vessel.   °· Bruising or bleeding at the site of puncture.   °· Introduction of the catheter into an artery instead of a vein.   °· Skin infection at the site of catheter insertion.   °· Bloodstream infection, especially if your white blood cell count is low.   °· Developing a kink in the catheter so it does not work properly.   °· Developing a hole or crack in the catheter.   °· Blockage of the catheter.   °· Getting air in the catheter. °· Blood clots  around the catheter or in the vein near the catheter. °· Disturbance in the normal heart rhythm (rare). This is usually temporary.   °· A collapsed lung during insertion (rare).   °BEFORE THE PROCEDURE  °· You may need to have blood tests done before the day of the procedure.   °· Do not eat or drink anything for at least 8 hours before the procedure or as directed by your health care provider.   °· Ask your health care provider about changing or stopping your regular medicines. °· Avoid wearing jewelry the day of the procedure.   °· Make plans to have someone drive you home after the procedure. You should not drive immediately after the procedure.   °PROCEDURE °· You will be asked to lie on your back. °· A regular intravenous (IV) access tube may be put into a vein in your hand or arm. During the procedure, medicine can flow directly into your body through the IV tube. °· Small monitors will be put on your body. They are used to check your heart, blood pressure, and oxygen level. °· The catheter site is usually shaved, cleaned, and covered with a sterile drape. °· You will be given medicine to numb the area where the catheter will be placed (local anesthetic). You may also be given a medicine to help you relax (sedative). °· The health care provider will then make a small incision in the skin, usually in the lower neck. Another small incision is made a little lower, usually on the shoulder or upper chest. Ultrasonography may be used so that the health   care provider can see the vein and can properly guide the catheter placement.  X-ray equipment may also be used to help ensure that the catheter is inserted safely and is placed where it will function most effectively. This equipment allows the health care provider to watch the catheter on a live display while guiding it into place.  Generally, a small guidewire is put into the vein first. A tunnel is created under the skin. The health care provider guides the  movement of the guidewire into a larger vein closer to the heart.  The catheter is pulled through the tunnel and then moved into the larger vein. The cuff on the catheter is located in the tunnel part. The cuff helps to anchor the catheter in place over time. Stitches are used to keep the catheter in place when first put in.  An X-ray may be done to make sure the catheter is in the right place. AFTER THE PROCEDURE   You may stay in a recovery area until the sedation has worn off.  Your heart rate, blood pressure and oxygen level will be monitored.  You may have some pain and swelling in the neck or shoulder. You will likely be given medicine to control this.  If this was done as an outpatient procedure, you may be able to go home the same day. Document Released: 03/30/2007 Document Revised: 07/25/2013 Document Reviewed: 01/21/2012 [facility] Patient Information 2015 [place], [place]. This information is not intended to replace advice given to you by your health care provider. Make sure you discuss any questions you have with your health care provider. Conscious Sedation Sedation is the use of medicines to promote relaxation and relieve discomfort and anxiety. Conscious sedation is a type of sedation. Under conscious sedation you are less alert than normal but are still able to respond to instructions or stimulation. Conscious sedation is used during short medical and dental procedures. It is milder than deep sedation or general anesthesia and allows you to return to your regular activities sooner.  LET [facility] CARE PROVIDER KNOW ABOUT:   Any allergies you have.  All medicines you are taking, including vitamins, herbs, eye drops, creams, and over-the-counter medicines.  Use of steroids (by mouth or creams).  Previous problems you or members of your family have had with the use of anesthetics.  Any blood disorders you have.  Previous surgeries you have had.  Medical conditions you  have.  Possibility of pregnancy, if this applies.  Use of cigarettes, alcohol, or illegal drugs. RISKS AND COMPLICATIONS Generally, this is a safe procedure. However, as with any procedure, problems can occur. Possible problems include:  Oversedation.  Trouble breathing on your own. You may need to have a breathing tube until you are awake and breathing on your own.  Allergic reaction to any of the medicines used for the procedure. BEFORE THE PROCEDURE  You may have blood tests done. These tests can help show how well your kidneys and liver are working. They can also show how well your blood clots.  A physical exam will be done.  Only take medicines as directed by your health care provider. You may need to stop taking medicines (such as blood thinners, aspirin, or nonsteroidal anti-inflammatory drugs) before the procedure.   Do not eat or drink at least 6 hours before the procedure or as directed by your health care provider.  Arrange for a responsible adult, family member, or friend to take you home after the procedure. He or  she should stay with you for at least 24 hours after the procedure, until the medicine has worn off. PROCEDURE   An intravenous (IV) catheter will be inserted into one of your veins. Medicine will be able to flow directly into your body through this catheter. You may be given medicine through this tube to help prevent pain and help you relax.  The medical or dental procedure will be done. AFTER THE PROCEDURE  You will stay in a recovery area until the medicine has worn off. Your blood pressure and pulse will be checked.   Depending on the procedure you had, you may be allowed to go home when you can tolerate liquids and your pain is under control. Document Released: 12/03/2000 Document Revised: 03/15/2013 Document Reviewed: 11/15/2012 [facility] Patient Information 2015 [place], [place]. This information is not intended to replace advice given to you by  your health care provider. Make sure you discuss any questions you have with your health care provider. Tunneled Catheter Insertion, Care After Refer to this sheet in the next few weeks. These instructions provide you with information on caring for yourself after your procedure. Your caregiver may also give you more specific instructions. Your treatment has been planned according to current medical practices, but problems sometimes occur. Call your caregiver if you have any problems or questions after your procedure.  HOME CARE INSTRUCTIONS  Rest at home the day of the procedure. You will likely be able to return to normal activities the following day.  Follow your caregiver's specific instructions for the type of device that you have.  Only take over-the-counter or prescription medicines as directed by your caregiver.  Keep the insertion site of the catheter clean and dry at all times.  Change the bandages (dressings) over the catheter site as directed by your caregiver.  Wash the area around the catheter site during each dressing change. Sponge bathe the area using a germ-killing (antiseptic) solution as directed by your caregiver.  Look for redness or swelling at the insertion site during each dressing change.  Apply an antibiotic ointment as directed by your caregiver.  Flush your catheter as directed to keep it from becoming clogged.  Always wash your hands thoroughly before changing dressings or flushing the catheter.  Do notlet air enter the catheter.  Never open the cap at the catheter tip.  Always make sure there is no air in the syringe or in the tubing for infusions.   Do notlift anything heavy.  Do not drive until your caregiver approves.  Do not shower or bathe until your caregiver approves. When you shower or bathe, place a piece of plastic wrap over the catheter site. Do not allow the catheter site or the dressing to get wet. If taking a bath, do not allow the  catheter to get submerged in the water. If the catheter was inserted through an arm vein:  Avoid wearing tight clothes or jewelry on the arm that has the catheter.   Do not sleep with your head on the arm that has the catheter.   Do not allow use of a blood pressure cuff on the arm that has the catheter.   Do not let anyone draw blood from the arm that has the catheter, except through the catheter itself. SEEK MEDICAL CARE IF:  You have bleeding at the insertion site of the catheter.   You feel weak or nauseous.   Your catheter is not working properly.   You have redness, pain, swelling, and  warmth at the insertion site.   You notice fluid draining from the insertion site.  SEEK IMMEDIATE MEDICAL CARE IF:  Your catheter breaks or has a hole in it.   Your catheter comes loose or gets pulled completely out. If this happens, hold firm pressure over the area with your hand or a clean cloth.   You have a fever.  You have chills.   Your catheter becomes totally blocked.   You have swelling in your arm, shoulder, neck, or face.   You have bleeding from the insertion site that does not stop.   You develop chest pain or have trouble breathing.   You feel dizzy or faint.  MAKE SURE YOU:  Understand these instructions.  Will watch your condition.  Will get help right away if you are not doing well or get worse. Document Released: 02/25/2012 Document Revised: 11/10/2012 Document Reviewed: 02/25/2012 [facility] Patient Information 2015 [place], [place]. This information is not intended to replace advice given to you by your health care provider. Make sure you discuss any questions you have with your health care provider.

## 2014-08-11 ENCOUNTER — Telehealth: Payer: Self-pay | Admitting: Vascular Surgery

## 2014-08-11 NOTE — Telephone Encounter (Signed)
Received 2 CD's from Dr.Lin's office

## 2014-08-22 ENCOUNTER — Other Ambulatory Visit: Payer: Self-pay

## 2014-08-22 DIAGNOSIS — N186 End stage renal disease: Secondary | ICD-10-CM

## 2014-08-22 DIAGNOSIS — Z01810 Encounter for preprocedural cardiovascular examination: Secondary | ICD-10-CM

## 2014-08-28 ENCOUNTER — Encounter: Payer: Self-pay | Admitting: Vascular Surgery

## 2014-08-29 ENCOUNTER — Encounter: Payer: Self-pay | Admitting: Vascular Surgery

## 2014-08-30 ENCOUNTER — Ambulatory Visit (INDEPENDENT_AMBULATORY_CARE_PROVIDER_SITE_OTHER)
Admission: RE | Admit: 2014-08-30 | Discharge: 2014-08-30 | Disposition: A | Discharge status: Home / Self Care | Payer: Medicare Other | Source: Ambulatory Visit | Attending: Vascular Surgery | Admitting: Vascular Surgery

## 2014-08-30 ENCOUNTER — Encounter: Payer: Self-pay | Admitting: Vascular Surgery

## 2014-08-30 ENCOUNTER — Ambulatory Visit (HOSPITAL_COMMUNITY)
Admission: RE | Admit: 2014-08-30 | Discharge: 2014-08-30 | Disposition: A | Discharge status: Home / Self Care | Payer: Medicare Other | Source: Ambulatory Visit | Attending: Vascular Surgery | Admitting: Vascular Surgery

## 2014-08-30 ENCOUNTER — Ambulatory Visit (INDEPENDENT_AMBULATORY_CARE_PROVIDER_SITE_OTHER): Payer: Medicare Other | Admitting: Vascular Surgery

## 2014-08-30 VITALS — BP 139/90 | HR 91 | Resp 18 | Ht 72.5 in | Wt 348.7 lb

## 2014-08-30 DIAGNOSIS — Z992 Dependence on renal dialysis: Secondary | ICD-10-CM | POA: Diagnosis not present

## 2014-08-30 DIAGNOSIS — N186 End stage renal disease: Secondary | ICD-10-CM

## 2014-08-30 DIAGNOSIS — Z01810 Encounter for preprocedural cardiovascular examination: Secondary | ICD-10-CM

## 2014-08-30 NOTE — Progress Notes (Signed)
[facility] [place] HISTORY AND PHYSICAL   History of Present Illness:  Patient is a 39 y.o. year old male who presents for placement of a permanent hemodialysis access.  The patient is not currently on hemodialysis.  The cause of renal failure is thought to be secondary to hypertension.  The patient has had multiple prior access procedures. He has had a prior left brachiocephalic fistula, right radiocephalic, right brachiocephalic, right basilic vein transposition. All of these have failed. Other chronic medical problems include hypertension, asthma, chronic back pain all of which are currently stable.  Past Medical History  Diagnosis Date  . Hypertension   . Renal disorder   . Asthma     Hx: of as a child  . Sleep apnea   . Headache(784.0)   . Arthritis   . Back pain     Hx: of back injury    Past Surgical History  Procedure Laterality Date  . Dg av dialysis  shunt access exist*l* or    . Appendectomy    . Bascilic vein transposition Right 07/22/2012    Procedure: BASCILIC VEIN TRANSPOSITION;  Surgeon: [name], MD;  [facility];  Service: Vascular;  Laterality: Right;     Social History History  Substance Use Topics  . Smoking status: Current Some Day Smoker -- 0.50 packs/day    Types: Cigarettes  . Smokeless tobacco: Never Used  . Alcohol Use: No    Family History Family History  Problem Relation Age of Onset  . Hypertension Mother   . Peripheral vascular disease Mother   . Diabetes Father   . Hyperlipidemia Father   . Hypertension Father   . Peripheral vascular disease Maternal Grandmother     Allergies  Allergies  Allergen Reactions  . Penicillins Anaphylaxis  . Wellbutrin [Bupropion] Rash     Current Outpatient Prescriptions  Medication Sig Dispense Refill  . amLODipine (NORVASC) 5 MG tablet Take 10 mg by mouth daily.     Marland Kitchen. aspirin 81 MG tablet Take 81 mg by mouth daily.    . cinacalcet (SENSIPAR) 90 MG tablet  Take 90 mg by mouth daily.     . folic acid (FOLVITE) 1 MG tablet Take 1 mg by mouth daily.    . multivitamin (RENA-VIT) TABS tablet Take 1 tablet by mouth daily.      . Oxycodone HCl 10 MG TABS Take 10 mg by mouth 3 (three) times daily.    . sucroferric oxyhydroxide (VELPHORO) 500 MG chewable tablet Chew 500 mg by mouth 3 (three) times daily with meals.    . cetirizine (ZYRTEC) 10 MG tablet Take 1 tablet (10 mg total) by mouth daily. (Patient not taking: Reported on 08/08/2014) 30 tablet 1  . olopatadine (PATANOL) 0.1 % ophthalmic solution Place 1 drop into both eyes 2 (two) times daily. (Patient not taking: Reported on 08/08/2014) 5 mL 0  . oxyCODONE-acetaminophen (PERCOCET/ROXICET) 5-325 MG per tablet Take 1-2 tablets by mouth every 4 (four) hours as needed for severe pain. (Patient not taking: Reported on 08/30/2014) 15 tablet 0  . PRESCRIPTION MEDICATION Take 2 tablets by mouth 3 (three) times daily with meals.    Marland Kitchen. sulfamethoxazole-trimethoprim (SEPTRA DS) 800-160 MG per tablet Take 1 tablet by mouth 2 (two) times daily. (Patient not taking: Reported on 08/08/2014) 20 tablet 0   No current facility-administered medications for this visit.    ROS:   General:  No weight loss, Fever, chills  HEENT: No recent headaches, no  nasal bleeding, no visual changes, no sore throat  Neurologic: No dizziness, blackouts, seizures. No recent symptoms of stroke or mini- stroke. No recent episodes of slurred speech, or temporary blindness.  Cardiac: No recent episodes of chest pain/pressure, no shortness of breath at rest.  No shortness of breath with exertion.  Denies history of atrial fibrillation or irregular heartbeat  Vascular: No history of rest pain in feet.  No history of claudication.  No history of non-healing ulcer, + history of DVT   Pulmonary: No home oxygen, no productive cough, no hemoptysis,  No asthma or wheezing  Musculoskeletal:  [ ]  Arthritis, [ ]  Low back pain,  [ ]  Joint  pain  Hematologic:No history of hypercoagulable state.  No history of easy bleeding.  No history of anemia  Gastrointestinal: No hematochezia or melena,  No gastroesophageal reflux, no trouble swallowing  Urinary: [ ]  chronic Kidney disease, [ ]  on HD - [ ]  MWF or [ ]  TTHS, [ ]  Burning with urination, [ ]  Frequent urination, [ ]  Difficulty urinating;   Skin: No rashes  Psychological: No history of anxiety,  No history of depression   Physical Examination  Filed Vitals:   08/30/14 1203 08/30/14 1206  BP: 132/100 139/90  Pulse: 93 91  Resp: 18   Height: 6' 0.5" (1.842 m)   Weight: 158.169 kg (348 lb 11.2 oz)     Body mass index is 46.62 kg/(m^2).  General:  Alert and oriented, no acute distress HEENT: Normal Neck: No bruit or JVD Pulmonary: Clear to auscultation bilaterally Cardiac: Regular Rate and Rhythm without murmur Gastrointestinal: Soft, non-tender, non-distended, no mass, obese  Skin: No rash Extremity Pulses:  2+ radial, brachial pulses difficult to palpate bilaterally Musculoskeletal: No deformity or edema  Neurologic: Upper and lower extremity motor 5/5 and symmetric  DATA: Patient had an upper extremity arterial duplex of the left arm today. This should've the brachial artery had triphasic waveforms 6 mm in diameter at the antecubital region. Radial and ulnar arteries were also triphasic. Vein mapping showed the left basilic vein was 4-5 mm diameter.   ASSESSMENT: Patient needs long-term hemodialysis access.   PLAN:  Left basilic vein transposition with general anesthesia 09/05/2014. Risks benefits possible complications and procedure details were explained the patient today including but not limited to bleeding infection fistula thrombosis ischemic steal non-maturation fistula he understands and agrees to proceed  Fabienne Bruns, MD [facility] [place] Office: [phone] Pager: [phone]

## 2014-08-30 NOTE — Progress Notes (Signed)
.   Filed Vitals:   08/30/14 1203 08/30/14 1206  BP: 132/100 139/90  Pulse: 93 91  Resp: 18   Height: 6' 0.5" (1.842 m)   Weight: 348 lb 11.2 oz (158.169 kg)   Body mass index is 46.62 kg/(m^2).

## 2014-08-31 ENCOUNTER — Other Ambulatory Visit: Payer: Self-pay

## 2014-09-04 ENCOUNTER — Encounter (HOSPITAL_COMMUNITY): Payer: Self-pay | Admitting: *Deleted

## 2014-09-04 MED ORDER — VANCOMYCIN HCL 10 G IV SOLR
1500.0000 mg | INTRAVENOUS | Status: AC
  Administered 2014-09-05: 1500 mg via INTRAVENOUS
  Filled 2014-09-04 (×2): qty 1500

## 2014-09-05 ENCOUNTER — Ambulatory Visit (HOSPITAL_COMMUNITY): Payer: Medicare Other | Admitting: Anesthesiology

## 2014-09-05 ENCOUNTER — Encounter (HOSPITAL_COMMUNITY): Payer: Self-pay | Admitting: *Deleted

## 2014-09-05 ENCOUNTER — Ambulatory Visit (HOSPITAL_COMMUNITY)
Admission: RE | Admit: 2014-09-05 | Discharge: 2014-09-05 | Disposition: A | Discharge status: Home / Self Care | Payer: Medicare Other | Source: Ambulatory Visit | Attending: Vascular Surgery | Admitting: Vascular Surgery

## 2014-09-05 ENCOUNTER — Encounter (HOSPITAL_COMMUNITY)
Admission: RE | Disposition: A | Discharge status: Home / Self Care | Payer: Self-pay | Source: Ambulatory Visit | Attending: Vascular Surgery

## 2014-09-05 ENCOUNTER — Telehealth: Payer: Self-pay | Admitting: Vascular Surgery

## 2014-09-05 DIAGNOSIS — I252 Old myocardial infarction: Secondary | ICD-10-CM | POA: Diagnosis not present

## 2014-09-05 DIAGNOSIS — N185 Chronic kidney disease, stage 5: Secondary | ICD-10-CM | POA: Diagnosis not present

## 2014-09-05 DIAGNOSIS — M549 Dorsalgia, unspecified: Secondary | ICD-10-CM | POA: Insufficient documentation

## 2014-09-05 DIAGNOSIS — Z79891 Long term (current) use of opiate analgesic: Secondary | ICD-10-CM | POA: Insufficient documentation

## 2014-09-05 DIAGNOSIS — I120 Hypertensive chronic kidney disease with stage 5 chronic kidney disease or end stage renal disease: Secondary | ICD-10-CM | POA: Diagnosis not present

## 2014-09-05 DIAGNOSIS — I517 Cardiomegaly: Secondary | ICD-10-CM | POA: Insufficient documentation

## 2014-09-05 DIAGNOSIS — R9431 Abnormal electrocardiogram [ECG] [EKG]: Secondary | ICD-10-CM | POA: Insufficient documentation

## 2014-09-05 DIAGNOSIS — Z7982 Long term (current) use of aspirin: Secondary | ICD-10-CM | POA: Diagnosis not present

## 2014-09-05 DIAGNOSIS — G8929 Other chronic pain: Secondary | ICD-10-CM | POA: Diagnosis not present

## 2014-09-05 DIAGNOSIS — Z9989 Dependence on other enabling machines and devices: Secondary | ICD-10-CM | POA: Insufficient documentation

## 2014-09-05 DIAGNOSIS — M1990 Unspecified osteoarthritis, unspecified site: Secondary | ICD-10-CM | POA: Diagnosis not present

## 2014-09-05 DIAGNOSIS — N186 End stage renal disease: Secondary | ICD-10-CM | POA: Insufficient documentation

## 2014-09-05 DIAGNOSIS — F17210 Nicotine dependence, cigarettes, uncomplicated: Secondary | ICD-10-CM | POA: Diagnosis not present

## 2014-09-05 DIAGNOSIS — J45909 Unspecified asthma, uncomplicated: Secondary | ICD-10-CM | POA: Insufficient documentation

## 2014-09-05 DIAGNOSIS — Z6841 Body Mass Index (BMI) 40.0 and over, adult: Secondary | ICD-10-CM | POA: Insufficient documentation

## 2014-09-05 DIAGNOSIS — R000 Tachycardia, unspecified: Secondary | ICD-10-CM | POA: Insufficient documentation

## 2014-09-05 DIAGNOSIS — Z79899 Other long term (current) drug therapy: Secondary | ICD-10-CM | POA: Insufficient documentation

## 2014-09-05 DIAGNOSIS — I509 Heart failure, unspecified: Secondary | ICD-10-CM | POA: Insufficient documentation

## 2014-09-05 DIAGNOSIS — G4733 Obstructive sleep apnea (adult) (pediatric): Secondary | ICD-10-CM | POA: Diagnosis not present

## 2014-09-05 HISTORY — PX: BASCILIC VEIN TRANSPOSITION: SHX5742

## 2014-09-05 HISTORY — DX: Family history of other specified conditions: Z84.89

## 2014-09-05 LAB — POCT I-STAT 4, (NA,K, GLUC, HGB,HCT)
Glucose, Bld: 112 mg/dL — ABNORMAL HIGH (ref 65–99)
HCT: 57 % — ABNORMAL HIGH (ref 39.0–52.0)
Hemoglobin: 19.4 g/dL — ABNORMAL HIGH (ref 13.0–17.0)
Potassium: 4.9 mmol/L (ref 3.5–5.1)
SODIUM: 134 mmol/L — AB (ref 135–145)

## 2014-09-05 SURGERY — TRANSPOSITION, VEIN, BASILIC
Anesthesia: General | Site: Arm Upper | Laterality: Left

## 2014-09-05 MED ORDER — OXYCODONE HCL 10 MG PO TABS: 10.0000 mg | ORAL_TABLET | Freq: Four times a day (QID) | ORAL | Status: DC | PRN

## 2014-09-05 MED ORDER — SUCCINYLCHOLINE CHLORIDE 20 MG/ML IJ SOLN
INTRAMUSCULAR | Status: DC | PRN
  Administered 2014-09-05: 60 mg via INTRAVENOUS

## 2014-09-05 MED ORDER — PROPOFOL 10 MG/ML IV BOLUS
INTRAVENOUS | Status: AC
  Filled 2014-09-05: qty 20

## 2014-09-05 MED ORDER — PROTAMINE SULFATE 10 MG/ML IV SOLN
INTRAVENOUS | Status: AC
  Filled 2014-09-05: qty 10

## 2014-09-05 MED ORDER — 0.9 % SODIUM CHLORIDE (POUR BTL) OPTIME
TOPICAL | Status: DC | PRN
  Administered 2014-09-05: 1000 mL

## 2014-09-05 MED ORDER — CHLORHEXIDINE GLUCONATE CLOTH 2 % EX PADS: 6.0000 | MEDICATED_PAD | Freq: Once | CUTANEOUS | Status: DC

## 2014-09-05 MED ORDER — ACETAMINOPHEN 325 MG PO TABS: 325.0000 mg | ORAL_TABLET | ORAL | Status: DC | PRN

## 2014-09-05 MED ORDER — THROMBIN 20000 UNITS EX SOLR
CUTANEOUS | Status: AC
  Filled 2014-09-05: qty 20000

## 2014-09-05 MED ORDER — ONDANSETRON HCL 4 MG/2ML IJ SOLN
INTRAMUSCULAR | Status: AC
  Filled 2014-09-05: qty 4

## 2014-09-05 MED ORDER — LIDOCAINE HCL (CARDIAC) 20 MG/ML IV SOLN
INTRAVENOUS | Status: DC | PRN
  Administered 2014-09-05: 80 mg via INTRATRACHEAL
  Administered 2014-09-05: 80 mg via INTRAVENOUS

## 2014-09-05 MED ORDER — OXYCODONE HCL 5 MG PO TABS
5.0000 mg | ORAL_TABLET | Freq: Once | ORAL | Status: AC | PRN
  Administered 2014-09-05: 5 mg via ORAL

## 2014-09-05 MED ORDER — SODIUM CHLORIDE 0.9 % IJ SOLN
INTRAMUSCULAR | Status: AC
  Filled 2014-09-05: qty 10

## 2014-09-05 MED ORDER — OXYCODONE HCL 5 MG PO TABS
ORAL_TABLET | ORAL | Status: AC
  Filled 2014-09-05: qty 1

## 2014-09-05 MED ORDER — PHENYLEPHRINE HCL 10 MG/ML IJ SOLN
10.0000 mg | INTRAVENOUS | Status: DC | PRN
  Administered 2014-09-05: 20 ug/min via INTRAVENOUS

## 2014-09-05 MED ORDER — EPHEDRINE SULFATE 50 MG/ML IJ SOLN
INTRAMUSCULAR | Status: DC | PRN
  Administered 2014-09-05: 10 mg via INTRAVENOUS

## 2014-09-05 MED ORDER — HEPARIN SODIUM (PORCINE) 1000 UNIT/ML IJ SOLN
INTRAMUSCULAR | Status: AC
  Filled 2014-09-05: qty 1

## 2014-09-05 MED ORDER — HEPARIN SODIUM (PORCINE) 5000 UNIT/ML IJ SOLN
INTRAMUSCULAR | Status: DC | PRN
  Administered 2014-09-05: 500 mL

## 2014-09-05 MED ORDER — LIDOCAINE HCL (PF) 1 % IJ SOLN
INTRAMUSCULAR | Status: AC
  Filled 2014-09-05: qty 30

## 2014-09-05 MED ORDER — HEPARIN SODIUM (PORCINE) 1000 UNIT/ML IJ SOLN
INTRAMUSCULAR | Status: DC | PRN
  Administered 2014-09-05: 7000 [IU] via INTRAVENOUS

## 2014-09-05 MED ORDER — ACETAMINOPHEN 160 MG/5ML PO SOLN
325.0000 mg | ORAL | Status: DC | PRN
  Filled 2014-09-05: qty 20.3

## 2014-09-05 MED ORDER — MIDAZOLAM HCL 2 MG/2ML IJ SOLN
INTRAMUSCULAR | Status: AC
  Filled 2014-09-05: qty 2

## 2014-09-05 MED ORDER — FENTANYL CITRATE (PF) 100 MCG/2ML IJ SOLN
INTRAMUSCULAR | Status: DC | PRN
  Administered 2014-09-05 (×3): 25 ug via INTRAVENOUS
  Administered 2014-09-05: 150 ug via INTRAVENOUS
  Administered 2014-09-05: 25 ug via INTRAVENOUS

## 2014-09-05 MED ORDER — EPHEDRINE SULFATE 50 MG/ML IJ SOLN
INTRAMUSCULAR | Status: AC
  Filled 2014-09-05: qty 1

## 2014-09-05 MED ORDER — SODIUM CHLORIDE 0.9 % IV SOLN
INTRAVENOUS | Status: DC | PRN
  Administered 2014-09-05: 10:00:00 via INTRAVENOUS

## 2014-09-05 MED ORDER — FENTANYL CITRATE (PF) 250 MCG/5ML IJ SOLN
INTRAMUSCULAR | Status: AC
  Filled 2014-09-05: qty 5

## 2014-09-05 MED ORDER — PROTAMINE SULFATE 10 MG/ML IV SOLN
INTRAVENOUS | Status: DC | PRN
  Administered 2014-09-05: 25 mg via INTRAVENOUS
  Administered 2014-09-05: 20 mg via INTRAVENOUS
  Administered 2014-09-05: 25 mg via INTRAVENOUS

## 2014-09-05 MED ORDER — OXYCODONE HCL 5 MG/5ML PO SOLN: 5.0000 mg | Freq: Once | ORAL | Status: AC | PRN

## 2014-09-05 MED ORDER — LIDOCAINE HCL (CARDIAC) 20 MG/ML IV SOLN
INTRAVENOUS | Status: AC
  Filled 2014-09-05: qty 10

## 2014-09-05 MED ORDER — SODIUM CHLORIDE 0.9 % IV SOLN
INTRAVENOUS | Status: DC
  Administered 2014-09-05: 08:00:00 via INTRAVENOUS

## 2014-09-05 MED ORDER — ONDANSETRON HCL 4 MG/2ML IJ SOLN
INTRAMUSCULAR | Status: AC
  Filled 2014-09-05: qty 2

## 2014-09-05 MED ORDER — PROPOFOL 10 MG/ML IV BOLUS
INTRAVENOUS | Status: DC | PRN
  Administered 2014-09-05: 40 mg via INTRAVENOUS
  Administered 2014-09-05: 200 mg via INTRAVENOUS
  Administered 2014-09-05: 30 mg via INTRAVENOUS

## 2014-09-05 MED ORDER — OXYCODONE HCL 5 MG PO TABS: 10.0000 mg | ORAL_TABLET | Freq: Four times a day (QID) | ORAL | Status: DC | PRN

## 2014-09-05 MED ORDER — FENTANYL CITRATE (PF) 100 MCG/2ML IJ SOLN
25.0000 ug | INTRAMUSCULAR | Status: DC | PRN
  Administered 2014-09-05 (×2): 50 ug via INTRAVENOUS

## 2014-09-05 MED ORDER — SUCCINYLCHOLINE CHLORIDE 20 MG/ML IJ SOLN
INTRAMUSCULAR | Status: AC
  Filled 2014-09-05: qty 1

## 2014-09-05 MED ORDER — PHENYLEPHRINE HCL 10 MG/ML IJ SOLN
INTRAMUSCULAR | Status: DC | PRN
  Administered 2014-09-05: 80 ug via INTRAVENOUS
  Administered 2014-09-05: 120 ug via INTRAVENOUS

## 2014-09-05 MED ORDER — FENTANYL CITRATE (PF) 100 MCG/2ML IJ SOLN
INTRAMUSCULAR | Status: AC
  Filled 2014-09-05: qty 2

## 2014-09-05 MED ORDER — ONDANSETRON HCL 4 MG/2ML IJ SOLN
INTRAMUSCULAR | Status: DC | PRN
  Administered 2014-09-05: 4 mg via INTRAVENOUS

## 2014-09-05 SURGICAL SUPPLY — 40 items
CANISTER SUCTION 2500CC (MISCELLANEOUS) ×2 IMPLANT
CANNULA VESSEL 3MM 2 BLNT TIP (CANNULA) ×4 IMPLANT
CLIP TI MEDIUM 24 (CLIP) ×2 IMPLANT
CLIP TI WIDE RED SMALL 24 (CLIP) ×2 IMPLANT
COVER PROBE W GEL 5X96 (DRAPES) ×2 IMPLANT
DECANTER SPIKE VIAL GLASS SM (MISCELLANEOUS) IMPLANT
ELECT REM PT RETURN 9FT ADLT (ELECTROSURGICAL) ×2
ELECTRODE REM PT RTRN 9FT ADLT (ELECTROSURGICAL) ×1 IMPLANT
GEL ULTRASOUND 20GR AQUASONIC (MISCELLANEOUS) IMPLANT
GLOVE BIO SURGEON STRL SZ 6.5 (GLOVE) ×4 IMPLANT
GLOVE BIO SURGEON STRL SZ7.5 (GLOVE) ×2 IMPLANT
GLOVE BIOGEL PI IND STRL 7.0 (GLOVE) ×2 IMPLANT
GLOVE BIOGEL PI IND STRL 7.5 (GLOVE) ×1 IMPLANT
GLOVE BIOGEL PI INDICATOR 7.0 (GLOVE) ×2
GLOVE BIOGEL PI INDICATOR 7.5 (GLOVE) ×1
GLOVE ECLIPSE 6.5 STRL STRAW (GLOVE) ×2 IMPLANT
GLOVE ECLIPSE 7.0 STRL STRAW (GLOVE) ×2 IMPLANT
GLOVE SURG SS PI 7.0 STRL IVOR (GLOVE) ×4 IMPLANT
GOWN STRL REUS W/ TWL LRG LVL3 (GOWN DISPOSABLE) ×5 IMPLANT
GOWN STRL REUS W/ TWL XL LVL3 (GOWN DISPOSABLE) ×1 IMPLANT
GOWN STRL REUS W/TWL LRG LVL3 (GOWN DISPOSABLE) ×5
GOWN STRL REUS W/TWL XL LVL3 (GOWN DISPOSABLE) ×1
KIT BASIN OR (CUSTOM PROCEDURE TRAY) ×2 IMPLANT
KIT ROOM TURNOVER OR (KITS) ×2 IMPLANT
LIQUID BAND (GAUZE/BANDAGES/DRESSINGS) ×4 IMPLANT
LOOP VESSEL MINI RED (MISCELLANEOUS) IMPLANT
NS IRRIG 1000ML POUR BTL (IV SOLUTION) ×2 IMPLANT
PACK CV ACCESS (CUSTOM PROCEDURE TRAY) ×2 IMPLANT
PAD ARMBOARD 7.5X6 YLW CONV (MISCELLANEOUS) ×4 IMPLANT
SPONGE SURGIFOAM ABS GEL 100 (HEMOSTASIS) IMPLANT
SUT PROLENE 7 0 BV 1 (SUTURE) ×6 IMPLANT
SUT SILK 2 0 SH (SUTURE) IMPLANT
SUT SILK 3 0 (SUTURE) ×2
SUT SILK 3-0 18XBRD TIE 12 (SUTURE) ×2 IMPLANT
SUT VIC AB 3-0 SH 27 (SUTURE) ×5
SUT VIC AB 3-0 SH 27X BRD (SUTURE) ×5 IMPLANT
SUT VICRYL 4-0 PS2 18IN ABS (SUTURE) ×10 IMPLANT
TAPE UMBILICAL COTTON 1/8X30 (MISCELLANEOUS) ×2 IMPLANT
UNDERPAD 30X30 INCONTINENT (UNDERPADS AND DIAPERS) ×4 IMPLANT
WATER STERILE IRR 1000ML POUR (IV SOLUTION) ×2 IMPLANT

## 2014-09-05 NOTE — Interval H&P Note (Signed)
History and Physical Interval Note:  09/05/2014 9:41 AM  Bradley [name]  has presented today for surgery, with the diagnosis of End Stage Renal Disease  N18.6  The various methods of treatment have been discussed with the patient and family. After consideration of risks, benefits and other options for treatment, the patient has consented to  Procedure(s): BASCILIC VEIN TRANSPOSITION-LEFT (Left) as a surgical intervention .  The patient's history has been reviewed, patient examined, no change in status, stable for surgery.  I have reviewed the patient's chart and labs.  Questions were answered to the patient's satisfaction.     Fabienne Bruns

## 2014-09-05 NOTE — H&P (View-Only) (Signed)
[facility] [place] HISTORY AND PHYSICAL   History of Present Illness:  Patient is a 39 y.o. year old male who presents for placement of a permanent hemodialysis access.  The patient is not currently on hemodialysis.  The cause of renal failure is thought to be secondary to hypertension.  The patient has had multiple prior access procedures. He has had a prior left brachiocephalic fistula, right radiocephalic, right brachiocephalic, right basilic vein transposition. All of these have failed. Other chronic medical problems include hypertension, asthma, chronic back pain all of which are currently stable.  Past Medical History  Diagnosis Date  . Hypertension   . Renal disorder   . Asthma     Hx: of as a child  . Sleep apnea   . Headache(784.0)   . Arthritis   . Back pain     Hx: of back injury    Past Surgical History  Procedure Laterality Date  . Dg av dialysis  shunt access exist*l* or    . Appendectomy    . Bascilic vein transposition Right 07/22/2012    Procedure: BASCILIC VEIN TRANSPOSITION;  Surgeon: [name], MD;  [facility];  Service: Vascular;  Laterality: Right;     Social History History  Substance Use Topics  . Smoking status: Current Some Day Smoker -- 0.50 packs/day    Types: Cigarettes  . Smokeless tobacco: Never Used  . Alcohol Use: No    Family History Family History  Problem Relation Age of Onset  . Hypertension Mother   . Peripheral vascular disease Mother   . Diabetes Father   . Hyperlipidemia Father   . Hypertension Father   . Peripheral vascular disease Maternal Grandmother     Allergies  Allergies  Allergen Reactions  . Penicillins Anaphylaxis  . Wellbutrin [Bupropion] Rash     Current Outpatient Prescriptions  Medication Sig Dispense Refill  . amLODipine (NORVASC) 5 MG tablet Take 10 mg by mouth daily.     Marland Kitchen. aspirin 81 MG tablet Take 81 mg by mouth daily.    . cinacalcet (SENSIPAR) 90 MG tablet  Take 90 mg by mouth daily.     . folic acid (FOLVITE) 1 MG tablet Take 1 mg by mouth daily.    . multivitamin (RENA-VIT) TABS tablet Take 1 tablet by mouth daily.      . Oxycodone HCl 10 MG TABS Take 10 mg by mouth 3 (three) times daily.    . sucroferric oxyhydroxide (VELPHORO) 500 MG chewable tablet Chew 500 mg by mouth 3 (three) times daily with meals.    . cetirizine (ZYRTEC) 10 MG tablet Take 1 tablet (10 mg total) by mouth daily. (Patient not taking: Reported on 08/08/2014) 30 tablet 1  . olopatadine (PATANOL) 0.1 % ophthalmic solution Place 1 drop into both eyes 2 (two) times daily. (Patient not taking: Reported on 08/08/2014) 5 mL 0  . oxyCODONE-acetaminophen (PERCOCET/ROXICET) 5-325 MG per tablet Take 1-2 tablets by mouth every 4 (four) hours as needed for severe pain. (Patient not taking: Reported on 08/30/2014) 15 tablet 0  . PRESCRIPTION MEDICATION Take 2 tablets by mouth 3 (three) times daily with meals.    Marland Kitchen. sulfamethoxazole-trimethoprim (SEPTRA DS) 800-160 MG per tablet Take 1 tablet by mouth 2 (two) times daily. (Patient not taking: Reported on 08/08/2014) 20 tablet 0   No current facility-administered medications for this visit.    ROS:   General:  No weight loss, Fever, chills  HEENT: No recent headaches, no  nasal bleeding, no visual changes, no sore throat  Neurologic: No dizziness, blackouts, seizures. No recent symptoms of stroke or mini- stroke. No recent episodes of slurred speech, or temporary blindness.  Cardiac: No recent episodes of chest pain/pressure, no shortness of breath at rest.  No shortness of breath with exertion.  Denies history of atrial fibrillation or irregular heartbeat  Vascular: No history of rest pain in feet.  No history of claudication.  No history of non-healing ulcer, + history of DVT   Pulmonary: No home oxygen, no productive cough, no hemoptysis,  No asthma or wheezing  Musculoskeletal:  [ ]  Arthritis, [ ]  Low back pain,  [ ]  Joint  pain  Hematologic:No history of hypercoagulable state.  No history of easy bleeding.  No history of anemia  Gastrointestinal: No hematochezia or melena,  No gastroesophageal reflux, no trouble swallowing  Urinary: [ ]  chronic Kidney disease, [ ]  on HD - [ ]  MWF or [ ]  TTHS, [ ]  Burning with urination, [ ]  Frequent urination, [ ]  Difficulty urinating;   Skin: No rashes  Psychological: No history of anxiety,  No history of depression   Physical Examination  Filed Vitals:   08/30/14 1203 08/30/14 1206  BP: 132/100 139/90  Pulse: 93 91  Resp: 18   Height: 6' 0.5" (1.842 m)   Weight: 158.169 kg (348 lb 11.2 oz)     Body mass index is 46.62 kg/(m^2).  General:  Alert and oriented, no acute distress HEENT: Normal Neck: No bruit or JVD Pulmonary: Clear to auscultation bilaterally Cardiac: Regular Rate and Rhythm without murmur Gastrointestinal: Soft, non-tender, non-distended, no mass, obese  Skin: No rash Extremity Pulses:  2+ radial, brachial pulses difficult to palpate bilaterally Musculoskeletal: No deformity or edema  Neurologic: Upper and lower extremity motor 5/5 and symmetric  DATA: Patient had an upper extremity arterial duplex of the left arm today. This should've the brachial artery had triphasic waveforms 6 mm in diameter at the antecubital region. Radial and ulnar arteries were also triphasic. Vein mapping showed the left basilic vein was 4-5 mm diameter.   ASSESSMENT: Patient needs long-term hemodialysis access.   PLAN:  Left basilic vein transposition with general anesthesia 09/05/2014. Risks benefits possible complications and procedure details were explained the patient today including but not limited to bleeding infection fistula thrombosis ischemic steal non-maturation fistula he understands and agrees to proceed  Fabienne Bruns, MD [facility] [place] Office: [phone] Pager: [phone]

## 2014-09-05 NOTE — Transfer of Care (Signed)
Immediate Anesthesia Transfer of Care Note  Patient: Bradley [name]  Procedure(s) Performed: Procedure(s): BASCILIC VEIN TRANSPOSITION-LEFT (Left)  Patient Location: PACU  Anesthesia Type:General  Level of Consciousness: awake, alert , oriented and patient cooperative  Airway & Oxygen Therapy: Patient Spontanous Breathing and Patient connected to face mask oxygen  Post-op Assessment: Report given to RN, Post -op Vital signs reviewed and stable and Patient moving all extremities  Post vital signs: Reviewed and stable  Last Vitals:  Filed Vitals:   09/05/14 0725  BP: 115/71  Pulse: 111  Temp: 36.7 C  Resp: 18    Complications: No apparent anesthesia complications

## 2014-09-05 NOTE — Anesthesia Preprocedure Evaluation (Signed)
Anesthesia Evaluation  Patient identified by MRN, date of birth, ID band Patient awake    Reviewed: Allergy & Precautions, NPO status , Patient's Chart, lab work & pertinent test results  History of Anesthesia Complications Negative for: history of anesthetic complications  Airway Mallampati: III  TM Distance: >3 FB Neck ROM: Full    Dental  (+) Teeth Intact   Pulmonary neg shortness of breath, sleep apnea and Continuous Positive Airway Pressure Ventilation , Current Smoker,  Severe OSA, CPAP taken by medicaid breath sounds clear to auscultation        Cardiovascular hypertension, Pt. on medications - angina- Past MI and - CHF - dysrhythmias Rhythm:Regular     Neuro/Psych  Headaches, negative psych ROS   GI/Hepatic negative GI ROS, Neg liver ROS,   Endo/Other  Morbid obesity  Renal/GU ESRF and DialysisRenal disease     Musculoskeletal  (+) Arthritis -,   Abdominal   Peds  Hematology negative hematology ROS (+)   Anesthesia Other Findings   Reproductive/Obstetrics                             Anesthesia Physical Anesthesia Plan  ASA: III  Anesthesia Plan: General   Post-op Pain Management:    Induction: Intravenous  Airway Management Planned: LMA and Oral ETT  Additional Equipment: None  Intra-op Plan:   Post-operative Plan: Extubation in OR  Informed Consent: I have reviewed the patients History and Physical, chart, labs and discussed the procedure including the risks, benefits and alternatives for the proposed anesthesia with the patient or authorized representative who has indicated his/her understanding and acceptance.   Dental advisory given  Plan Discussed with: CRNA and Surgeon  Anesthesia Plan Comments: (Dialysis 6/13 no problems)        Anesthesia Quick Evaluation

## 2014-09-05 NOTE — Telephone Encounter (Addendum)
-----   Message from Sharee Pimple, RN sent at 09/05/2014  2:57 PM EDT ----- Regarding: Schedule   ----- Message -----    From: Lars Mage, PA-C    Sent: 09/05/2014   1:16 PM      To: Vvs Charge Pool  Transposition left arm fistula f/u in 4 weeks with Dr. Darrick Penna  notified patient of post op appt. on 10-12-14 at 8:30 with dr. Darrick Penna

## 2014-09-05 NOTE — Anesthesia Procedure Notes (Signed)
Procedure Name: Intubation Date/Time: 09/05/2014 9:58 AM Performed by: Julian Reil Pre-anesthesia Checklist: Patient identified, Emergency Drugs available, Suction available and Patient being monitored Patient Re-evaluated:Patient Re-evaluated prior to inductionOxygen Delivery Method: Circle system utilized Preoxygenation: Pre-oxygenation with 100% oxygen Intubation Type: IV induction Ventilation: Mask ventilation without difficulty Laryngoscope Size: Mac and 4 Grade View: Grade II Tube type: Oral Tube size: 7.5 mm Number of attempts: 1 Airway Equipment and Method: Stylet and LTA kit utilized Placement Confirmation: ETT inserted through vocal cords under direct vision,  positive ETCO2 and breath sounds checked- equal and bilateral Secured at: 23 cm Tube secured with: Tape Dental Injury: Teeth and Oropharynx as per pre-operative assessment

## 2014-09-05 NOTE — Op Note (Signed)
Procedure: Left basilic vein transposition fistula  Preoperative diagnosis: End-stage renal disease  Postoperative diagnosis: Same  Anesthesia: Gen.  Assistant: Doreatha Massed, PA-C, Lianne Cure PA-C  Operative findings: 3-5 mm left basilic vein, 3 mm left brachial artery  Operative details: After obtaining informed consent, the patient was taken to the operating room. The patient was placed in supine position operating table. After induction of general anesthesia, the patient's entire left upper extremity was prepped and draped in the usual sterile fashion. Next ultrasound was used to identify the left basilic vein. A longitudinal incision was made just above the antecubital crease in order to expose the basilic vein. The vein was of good quality approximately 3-5 mm in diameter throughout its course. Several longitudinal skip incisions were made up the arm from just below the antecubital crease all the way up to the axilla to harvest the basilic vein. Care was taken to try to not injure any sensory nerves and all the motor nerves were identified and protected. The vein was dissected free circumferentially and small side branches ligated and divided between silk ties or clips. Next the brachial artery was exposed by deepening the basilic vein harvest incision just above the antecubital crease. The artery was approximately 3 mm in diameter. This was dissected free circumferentially. Vessel loops were placed around it. There was some spasm within the artery after dissecting it free. Next the distal basilic vein was ligated with a 2 silk tie and the vein transected. The vein was brought out throughout the skip incisions and gently distended with heparinized saline and marked for orientation. The vein was then tunneled subcutaneously in an arcing configuration out over the biceps muscle down to the level of the exposed brachial artery just above the antecubital crease. The patient was given 7000 units  of intravenous heparin. Vessel loops were used to control the artery proximally and distally. The vein was cut to length and sewn end of vein to side of artery using a running 7-0 Prolene suture. Just prior completion of the anastomosis, it was forebled backbled and thoroughly flushed. The anastomosis was secured Vesseloops were released.   Initially the flow was fairly sluggish due to spasm. However there was a palpable thrill after a few minutes of the proximal aspect of the fistula. There was also good Doppler flow throughout the course of the fistula. The distal vein was also noted to fill fully. At this point all subcutaneous tissues were reapproximated using running 3-0 Vicryl suture. All skin incisions were closed with running 4  0 Vicryl subcuticular stitch. Dermabond was applied to all incisions. The patient tolerated the procedure well and there were no complications. Instrument sponge needle counts were correct at the end of the case. The patient was taken to the recovery room in stable condition.  Fabienne Bruns, MD [facility] [place] Office: [phone] Pager: [phone]

## 2014-09-06 ENCOUNTER — Encounter (HOSPITAL_COMMUNITY): Payer: Self-pay | Admitting: Vascular Surgery

## 2014-09-06 NOTE — Anesthesia Postprocedure Evaluation (Signed)
  Anesthesia Post-op Note  Patient: Bradley [name]  Procedure(s) Performed: Procedure(s): BASCILIC VEIN TRANSPOSITION-LEFT (Left)  Patient Location: PACU  Anesthesia Type:General  Level of Consciousness: awake  Airway and Oxygen Therapy: Patient Spontanous Breathing  Post-op Pain: mild  Post-op Assessment: Post-op Vital signs reviewed, Patient's Cardiovascular Status Stable, Respiratory Function Stable, Patent Airway, No signs of Nausea or vomiting and Pain level controlled              Post-op Vital Signs: Reviewed and stable  Last Vitals:  Filed Vitals:   09/05/14 1434  BP: 103/66  Pulse: 95  Temp:   Resp:     Complications: No apparent anesthesia complications

## 2014-09-14 ENCOUNTER — Encounter: Payer: Self-pay | Admitting: Surgery

## 2014-09-18 ENCOUNTER — Encounter (HOSPITAL_COMMUNITY): Payer: Self-pay | Admitting: *Deleted

## 2014-09-18 ENCOUNTER — Encounter: Payer: Self-pay | Admitting: Surgery

## 2014-09-18 ENCOUNTER — Ambulatory Visit (INDEPENDENT_AMBULATORY_CARE_PROVIDER_SITE_OTHER): Payer: Self-pay | Admitting: Surgery

## 2014-09-18 ENCOUNTER — Other Ambulatory Visit: Payer: Self-pay

## 2014-09-18 VITALS — BP 143/90 | HR 97 | Ht 72.0 in | Wt 346.0 lb

## 2014-09-18 DIAGNOSIS — Z992 Dependence on renal dialysis: Secondary | ICD-10-CM

## 2014-09-18 DIAGNOSIS — N186 End stage renal disease: Secondary | ICD-10-CM

## 2014-09-18 NOTE — Progress Notes (Signed)
    Postoperative Access Visit   History of Present Illness  Bradley [name] is a 39 y.o. year old male who presents for evaluation of suspected clotted left basilic vein transposition created on 09/05/14 by Dr. Fields. His primary nephrologist is Dr. Deterding. He dialyzes at night on Mondays, Wednesdays and Fridays via a dialysis catheter. He complains of tenderness and swelling in his left forearm. He denies pain in his hand. He has a history of failed upper and lower arm fistulas in his right arm. He is right handed.   Physical Examination Filed Vitals:   09/18/14 1611  BP: 143/90  Pulse: 97    LUE: Incision is healing well, skin feels warm,  sensation in digits is intact, nonpalpable thrill, bruit cannot be auscultated, tenderness to palpation of left forearm with mild swelling.   Medical Decision Making  Bradley [name] is a 39 y.o. year old male with ESRD on HD MWF who presents with clotted left basilic vein transposition created 09/05/14.   Plan for thrombectomy and revision of his left basilic vein transposition tomorrow with Dr. Chen or Dr. Dickson. The patient is eager to have have his surgery as soon as possible. He is aware that if his left upper arm fistula is not salvageable, he may require a left forearm graft. If he does, this will be scheduled at a later time due to tenderness in his left forearm.   Kimberly Trinh, PA-C [facility] [place] Office: [phone]  09/18/2014, 4:39 PM  This patient was seen and examined in conjunction with Dr. Eshani Maestre.   I agree with the above.  I have seen and evaluated the patient.  The patient is recently status post left basilic vein transposition by Dr. fields on June 14.  His fistula has now thrombosed.  The underlying etiology for the thrombosis is unknown.  This is the patient's last usable vein for fistula creation.  Therefore, I have recommended reexploration of the area to see if patency can be restored.   According to renal, they're concerned that the patient may be hypercoagulable.  This may be the underlying reason for early failure although the quality of the vein versus possibly a kink given the size of the patient's arm may also be an explanation.  The patient currently on aspirin and has a prescription for Coumadin to start after his surgery.  I have recommended proceeding with an attempted thrombectomy of his basilic vein transposition.  If blood flow cannot be restored, he would be a candidate for a forearm graft on the left, however he is still having a fair amount of pain in the forearm, so this may need to be delayed.  He does have a functioning catheter in place.  The patient understands that Dr. Dickson, Dr. Chen, or myself will be doing his procedure tomorrow.   Bradley [name] 

## 2014-09-19 ENCOUNTER — Ambulatory Visit (HOSPITAL_COMMUNITY): Payer: Medicare Other | Admitting: Certified Registered"

## 2014-09-19 ENCOUNTER — Other Ambulatory Visit: Payer: Self-pay

## 2014-09-19 ENCOUNTER — Encounter (HOSPITAL_COMMUNITY): Payer: Self-pay | Admitting: Surgery

## 2014-09-19 ENCOUNTER — Ambulatory Visit (HOSPITAL_COMMUNITY)
Admission: RE | Admit: 2014-09-19 | Discharge: 2014-09-19 | Disposition: A | Discharge status: Home / Self Care | Payer: Medicare Other | Source: Ambulatory Visit | Attending: Vascular Surgery | Admitting: Vascular Surgery

## 2014-09-19 ENCOUNTER — Encounter (HOSPITAL_COMMUNITY)
Admission: RE | Disposition: A | Discharge status: Home / Self Care | Payer: Self-pay | Source: Ambulatory Visit | Attending: Vascular Surgery

## 2014-09-19 DIAGNOSIS — Z7982 Long term (current) use of aspirin: Secondary | ICD-10-CM | POA: Insufficient documentation

## 2014-09-19 DIAGNOSIS — Z79899 Other long term (current) drug therapy: Secondary | ICD-10-CM | POA: Insufficient documentation

## 2014-09-19 DIAGNOSIS — N186 End stage renal disease: Secondary | ICD-10-CM | POA: Insufficient documentation

## 2014-09-19 DIAGNOSIS — Z8249 Family history of ischemic heart disease and other diseases of the circulatory system: Secondary | ICD-10-CM | POA: Diagnosis not present

## 2014-09-19 DIAGNOSIS — G4730 Sleep apnea, unspecified: Secondary | ICD-10-CM | POA: Insufficient documentation

## 2014-09-19 DIAGNOSIS — T82868A Thrombosis of vascular prosthetic devices, implants and grafts, initial encounter: Secondary | ICD-10-CM | POA: Diagnosis not present

## 2014-09-19 DIAGNOSIS — M1990 Unspecified osteoarthritis, unspecified site: Secondary | ICD-10-CM | POA: Insufficient documentation

## 2014-09-19 DIAGNOSIS — Y832 Surgical operation with anastomosis, bypass or graft as the cause of abnormal reaction of the patient, or of later complication, without mention of misadventure at the time of the procedure: Secondary | ICD-10-CM | POA: Diagnosis not present

## 2014-09-19 DIAGNOSIS — T82858A Stenosis of vascular prosthetic devices, implants and grafts, initial encounter: Secondary | ICD-10-CM | POA: Diagnosis present

## 2014-09-19 DIAGNOSIS — Z7901 Long term (current) use of anticoagulants: Secondary | ICD-10-CM | POA: Insufficient documentation

## 2014-09-19 DIAGNOSIS — F17210 Nicotine dependence, cigarettes, uncomplicated: Secondary | ICD-10-CM | POA: Diagnosis not present

## 2014-09-19 DIAGNOSIS — Z832 Family history of diseases of the blood and blood-forming organs and certain disorders involving the immune mechanism: Secondary | ICD-10-CM | POA: Insufficient documentation

## 2014-09-19 DIAGNOSIS — Z6841 Body Mass Index (BMI) 40.0 and over, adult: Secondary | ICD-10-CM | POA: Insufficient documentation

## 2014-09-19 DIAGNOSIS — Z992 Dependence on renal dialysis: Secondary | ICD-10-CM | POA: Insufficient documentation

## 2014-09-19 DIAGNOSIS — Z833 Family history of diabetes mellitus: Secondary | ICD-10-CM | POA: Diagnosis not present

## 2014-09-19 LAB — POCT I-STAT 4, (NA,K, GLUC, HGB,HCT)
GLUCOSE: 87 mg/dL (ref 65–99)
Glucose, Bld: 92 mg/dL (ref 65–99)
HCT: 59 % — ABNORMAL HIGH (ref 39.0–52.0)
HCT: 54 % — ABNORMAL HIGH (ref 39.0–52.0)
HEMOGLOBIN: 18.4 g/dL — AB (ref 13.0–17.0)
Hemoglobin: 20.1 g/dL — ABNORMAL HIGH (ref 13.0–17.0)
POTASSIUM: 6.3 mmol/L — AB (ref 3.5–5.1)
POTASSIUM: 6.2 mmol/L — AB (ref 3.5–5.1)
SODIUM: 131 mmol/L — AB (ref 135–145)
Sodium: 130 mmol/L — ABNORMAL LOW (ref 135–145)

## 2014-09-19 SURGERY — THROMBECTOMY ARTERIOVENOUS FISTULA
Anesthesia: Choice | Laterality: Left

## 2014-09-19 MED ORDER — MIDAZOLAM HCL 2 MG/2ML IJ SOLN
INTRAMUSCULAR | Status: AC
  Filled 2014-09-19: qty 2

## 2014-09-19 MED ORDER — FENTANYL CITRATE (PF) 250 MCG/5ML IJ SOLN
INTRAMUSCULAR | Status: AC
  Filled 2014-09-19: qty 5

## 2014-09-19 MED ORDER — PROPOFOL 10 MG/ML IV BOLUS
INTRAVENOUS | Status: AC
  Filled 2014-09-19: qty 20

## 2014-09-19 MED ORDER — LIDOCAINE HCL (CARDIAC) 20 MG/ML IV SOLN
INTRAVENOUS | Status: AC
  Filled 2014-09-19: qty 5

## 2014-09-19 MED ORDER — SODIUM CHLORIDE 0.9 % IV SOLN
1250.0000 mg | INTRAVENOUS | Status: DC
  Filled 2014-09-19: qty 1250

## 2014-09-19 MED ORDER — SODIUM CHLORIDE 0.9 % IV SOLN
1500.0000 mg | INTRAVENOUS | Status: DC
  Filled 2014-09-19: qty 1500

## 2014-09-19 MED ORDER — PROTAMINE SULFATE 10 MG/ML IV SOLN
INTRAVENOUS | Status: AC
  Filled 2014-09-19: qty 10

## 2014-09-19 MED ORDER — CHLORHEXIDINE GLUCONATE CLOTH 2 % EX PADS: 6.0000 | MEDICATED_PAD | Freq: Once | CUTANEOUS | Status: DC

## 2014-09-19 MED ORDER — SODIUM CHLORIDE 0.9 % IV SOLN
INTRAVENOUS | Status: DC
  Administered 2014-09-19: 11:00:00 via INTRAVENOUS

## 2014-09-19 NOTE — Anesthesia Preprocedure Evaluation (Signed)
Anesthesia Evaluation  Patient identified by MRN, date of birth, ID band Patient awake    Reviewed: Allergy & Precautions, NPO status , Patient's Chart, lab work & pertinent test results  History of Anesthesia Complications Negative for: history of anesthetic complications  Airway Mallampati: III  TM Distance: >3 FB Neck ROM: Full    Dental  (+) Teeth Intact   Pulmonary neg shortness of breath, sleep apnea and Continuous Positive Airway Pressure Ventilation , Current Smoker,  Severe OSA, CPAP taken by medicaid breath sounds clear to auscultation        Cardiovascular hypertension, Pt. on medications - angina- Past MI and - CHF - dysrhythmias Rhythm:Regular     Neuro/Psych  Headaches, negative psych ROS   GI/Hepatic negative GI ROS, Neg liver ROS,   Endo/Other  Morbid obesity  Renal/GU ESRF and DialysisRenal disease     Musculoskeletal  (+) Arthritis -,   Abdominal   Peds  Hematology negative hematology ROS (+)   Anesthesia Other Findings   Reproductive/Obstetrics                             Anesthesia Physical Anesthesia Plan  ASA: III  Anesthesia Plan: MAC   Post-op Pain Management:    Induction: Intravenous  Airway Management Planned: Nasal Cannula  Additional Equipment: None  Intra-op Plan:   Post-operative Plan:   Informed Consent: I have reviewed the patients History and Physical, chart, labs and discussed the procedure including the risks, benefits and alternatives for the proposed anesthesia with the patient or authorized representative who has indicated his/her understanding and acceptance.   Dental advisory given  Plan Discussed with: CRNA and Surgeon  Anesthesia Plan Comments:         Anesthesia Quick Evaluation

## 2014-09-19 NOTE — Progress Notes (Signed)
Patient instructed to go to dialysis. Patient verbalized understanding. Patient discharged.

## 2014-09-19 NOTE — Progress Notes (Signed)
VASCULAR SURGERY:  This is a 39 year old gentleman who was seen in the office on 09/18/2014 by Dr. Myra GianottiBrabham.  He had a left basilic vein transposition created by Dr. Darrick PennaFields on 09/05/2014. This was occluded. His nephrologist felt that he may be hypercoagulable and the plan was to attempt thrombectomy and then start him on Coumadin. He was set up for a thrombectomy of his left basilic vein transposition today. He does nocturnal dialysis on Monday Wednesdays and Fridays and finished his last dialysis early this morning at 2:30 AM. His potassium in short stay was 6.3. Repeat potassium was 6.2. In addition he had peaked T waves. For this reason, his surgery was canceled. He is being sent urgently to the dialysis Center now for dialysis using his catheter. 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 Therefore we're trying to arrange for thrombectomy of his graft on Wednesday or Friday and he would prefer Wednesday. We are trying to make those arrangements.  Waverly Ferrarihristopher Dickson, MD, FACS Beeper [phone] Office: [phone]

## 2014-09-19 NOTE — H&P (View-Only) (Signed)
    Postoperative Access Visit   History of Present Illness  Bradley [name] is a 39 y.o. year old male who presents for evaluation of suspected clotted left basilic vein transposition created on 09/05/14 by Dr. Darrick PennaFields. His primary nephrologist is Dr. Darrick Pennaeterding. He dialyzes at night on Mondays, Wednesdays and Fridays via a dialysis catheter. He complains of tenderness and swelling in his left forearm. He denies pain in his hand. He has a history of failed upper and lower arm fistulas in his right arm. He is right handed.   Physical Examination Filed Vitals:   09/18/14 1611  BP: 143/90  Pulse: 97    LUE: Incision is healing well, skin feels warm,  sensation in digits is intact, nonpalpable thrill, bruit cannot be auscultated, tenderness to palpation of left forearm with mild swelling.   Medical Decision Making  Bradley [name] B Roddey is a 39 y.o. year old male with ESRD on HD MWF who presents with clotted left basilic vein transposition created 09/05/14.   Plan for thrombectomy and revision of his left basilic vein transposition tomorrow with Dr. Imogene Burnhen or Dr. Edilia Boickson. The patient is eager to have have his surgery as soon as possible. He is aware that if his left upper arm fistula is not salvageable, he may require a left forearm graft. If he does, this will be scheduled at a later time due to tenderness in his left forearm.   Maris BergerKimberly Trinh, PA-C [facility] [place] Office: [phone]  09/18/2014, 4:39 PM  This patient was seen and examined in conjunction with Dr. Myra GianottiBrabham.   I agree with the above.  I have seen and evaluated the patient.  The patient is recently status post left basilic vein transposition by Dr. Darrick Pennafields on June 14.  His fistula has now thrombosed.  The underlying etiology for the thrombosis is unknown.  This is the patient's last usable vein for fistula creation.  Therefore, I have recommended reexploration of the area to see if patency can be restored.   According to renal, they're concerned that the patient may be hypercoagulable.  This may be the underlying reason for early failure although the quality of the vein versus possibly a kink given the size of the patient's arm may also be an explanation.  The patient currently on aspirin and has a prescription for Coumadin to start after his surgery.  I have recommended proceeding with an attempted thrombectomy of his basilic vein transposition.  If blood flow cannot be restored, he would be a candidate for a forearm graft on the left, however he is still having a fair amount of pain in the forearm, so this may need to be delayed.  He does have a functioning catheter in place.  The patient understands that Dr. Edilia Boickson, Dr. Imogene Burnhen, or myself will be doing his procedure tomorrow.   Bradley [name]

## 2014-09-19 NOTE — Progress Notes (Signed)
Nurse informed Dr. Maple HudsonMoser that patients potassium level was 6.3. Dr. Maple HudsonMoser ordered for patient to have a EKG and placed on telemetry.

## 2014-09-19 NOTE — Interval H&P Note (Signed)
History and Physical Interval Note:  09/19/2014 12:07 PM  Bradley [name]  has presented today for surgery, with the diagnosis of End Stage Renal Disease N18.6; Clotted left arm basilic vein transposition T82.858 A  The various methods of treatment have been discussed with the patient and family. After consideration of risks, benefits and other options for treatment, the patient has consented to  Procedure(s): THROMBECTOMY BASILIC VEIN TRANSPOSITION (Left) as a surgical intervention .  The patient's history has been reviewed, patient examined, no change in status, stable for surgery.  I have reviewed the patient's chart and labs.  Questions were answered to the patient's satisfaction.     Waverly Ferrariickson, Uriel Dowding

## 2014-09-19 NOTE — Progress Notes (Signed)
I spoke with Bradley [name], he was unable to have dialysis, because catheter would not run.  Patient is to now take Kayexalate, which he just picked up from pharmacy.  I instructed patient on arrival time for am.

## 2014-09-19 NOTE — Progress Notes (Signed)
Dr. Edilia Boickson informed Nurse that patient needed to go to dialysis ASAP on [address] due to patient having a elevated potassium level. Will discontinue IV and prepare to discharge patient as ordered.

## 2014-09-19 NOTE — Progress Notes (Signed)
Patient placed on telemetry. NSR shown on telemetry. EKG performed by Antoinette, NT. Currently patient is resting in bed watching television. No needs. Call bell in reach. Will continue to closely monitor.

## 2014-09-20 ENCOUNTER — Ambulatory Visit (HOSPITAL_COMMUNITY)
Admission: RE | Admit: 2014-09-20 | Discharge: 2014-09-20 | Disposition: A | Discharge status: Home / Self Care | Payer: Medicare Other | Source: Ambulatory Visit | Attending: Vascular Surgery | Admitting: Vascular Surgery

## 2014-09-20 ENCOUNTER — Ambulatory Visit (HOSPITAL_COMMUNITY): Payer: Medicare Other | Admitting: Anesthesiology

## 2014-09-20 ENCOUNTER — Encounter (HOSPITAL_COMMUNITY)
Admission: RE | Disposition: A | Discharge status: Home / Self Care | Payer: Self-pay | Source: Ambulatory Visit | Attending: Vascular Surgery

## 2014-09-20 DIAGNOSIS — Z6841 Body Mass Index (BMI) 40.0 and over, adult: Secondary | ICD-10-CM | POA: Insufficient documentation

## 2014-09-20 DIAGNOSIS — I120 Hypertensive chronic kidney disease with stage 5 chronic kidney disease or end stage renal disease: Secondary | ICD-10-CM | POA: Diagnosis not present

## 2014-09-20 DIAGNOSIS — T82868A Thrombosis of vascular prosthetic devices, implants and grafts, initial encounter: Secondary | ICD-10-CM | POA: Diagnosis not present

## 2014-09-20 DIAGNOSIS — N186 End stage renal disease: Secondary | ICD-10-CM | POA: Insufficient documentation

## 2014-09-20 DIAGNOSIS — F17200 Nicotine dependence, unspecified, uncomplicated: Secondary | ICD-10-CM | POA: Insufficient documentation

## 2014-09-20 DIAGNOSIS — J45909 Unspecified asthma, uncomplicated: Secondary | ICD-10-CM | POA: Insufficient documentation

## 2014-09-20 DIAGNOSIS — Y832 Surgical operation with anastomosis, bypass or graft as the cause of abnormal reaction of the patient, or of later complication, without mention of misadventure at the time of the procedure: Secondary | ICD-10-CM | POA: Insufficient documentation

## 2014-09-20 DIAGNOSIS — G4730 Sleep apnea, unspecified: Secondary | ICD-10-CM | POA: Insufficient documentation

## 2014-09-20 DIAGNOSIS — Y929 Unspecified place or not applicable: Secondary | ICD-10-CM | POA: Diagnosis not present

## 2014-09-20 DIAGNOSIS — T82858A Stenosis of vascular prosthetic devices, implants and grafts, initial encounter: Secondary | ICD-10-CM

## 2014-09-20 DIAGNOSIS — M1990 Unspecified osteoarthritis, unspecified site: Secondary | ICD-10-CM | POA: Diagnosis not present

## 2014-09-20 DIAGNOSIS — Z79899 Other long term (current) drug therapy: Secondary | ICD-10-CM | POA: Insufficient documentation

## 2014-09-20 HISTORY — PX: AV FISTULA PLACEMENT: SHX1204

## 2014-09-20 HISTORY — PX: LIGATION OF ARTERIOVENOUS  FISTULA: SHX5948

## 2014-09-20 HISTORY — PX: THROMBECTOMY W/ EMBOLECTOMY: SHX2507

## 2014-09-20 LAB — POCT I-STAT 4, (NA,K, GLUC, HGB,HCT)
GLUCOSE: 104 mg/dL — AB (ref 65–99)
HCT: 54 % — ABNORMAL HIGH (ref 39.0–52.0)
Hemoglobin: 18.4 g/dL — ABNORMAL HIGH (ref 13.0–17.0)
Potassium: 4 mmol/L (ref 3.5–5.1)
Sodium: 135 mmol/L (ref 135–145)

## 2014-09-20 SURGERY — THROMBECTOMY ARTERIOVENOUS FISTULA
Anesthesia: General | Site: Arm Upper | Laterality: Left

## 2014-09-20 MED ORDER — FENTANYL CITRATE (PF) 250 MCG/5ML IJ SOLN
INTRAMUSCULAR | Status: AC
  Filled 2014-09-20: qty 5

## 2014-09-20 MED ORDER — SODIUM CHLORIDE 0.9 % IR SOLN
Status: DC | PRN
  Administered 2014-09-20: 12:00:00

## 2014-09-20 MED ORDER — OXYCODONE HCL 5 MG PO TABS
10.0000 mg | ORAL_TABLET | Freq: Once | ORAL | Status: AC
  Administered 2014-09-20: 10 mg via ORAL

## 2014-09-20 MED ORDER — HYDROMORPHONE HCL 1 MG/ML IJ SOLN
0.2500 mg | INTRAMUSCULAR | Status: DC | PRN
  Administered 2014-09-20 (×3): 0.5 mg via INTRAVENOUS
  Administered 2014-09-20 (×2): 0.25 mg via INTRAVENOUS

## 2014-09-20 MED ORDER — VANCOMYCIN HCL 10 G IV SOLR
1500.0000 mg | INTRAVENOUS | Status: AC
  Administered 2014-09-20: 1500 mg via INTRAVENOUS
  Filled 2014-09-20: qty 1500

## 2014-09-20 MED ORDER — ONDANSETRON HCL 4 MG/2ML IJ SOLN
INTRAMUSCULAR | Status: DC | PRN
  Administered 2014-09-20: 4 mg via INTRAVENOUS

## 2014-09-20 MED ORDER — MIDAZOLAM HCL 5 MG/5ML IJ SOLN
INTRAMUSCULAR | Status: DC | PRN
  Administered 2014-09-20: 2 mg via INTRAVENOUS

## 2014-09-20 MED ORDER — OXYCODONE HCL 5 MG PO TABS
ORAL_TABLET | ORAL | Status: AC
  Filled 2014-09-20: qty 2

## 2014-09-20 MED ORDER — FENTANYL CITRATE (PF) 250 MCG/5ML IJ SOLN
INTRAMUSCULAR | Status: DC | PRN
  Administered 2014-09-20 (×2): 25 ug via INTRAVENOUS
  Administered 2014-09-20: 100 ug via INTRAVENOUS

## 2014-09-20 MED ORDER — PROPOFOL 10 MG/ML IV BOLUS
INTRAVENOUS | Status: DC | PRN
  Administered 2014-09-20: 200 mg via INTRAVENOUS
  Administered 2014-09-20: 100 mg via INTRAVENOUS

## 2014-09-20 MED ORDER — MIDAZOLAM HCL 2 MG/2ML IJ SOLN
INTRAMUSCULAR | Status: AC
  Filled 2014-09-20: qty 2

## 2014-09-20 MED ORDER — PROPOFOL 10 MG/ML IV BOLUS
INTRAVENOUS | Status: AC
  Filled 2014-09-20: qty 20

## 2014-09-20 MED ORDER — LIDOCAINE HCL (CARDIAC) 20 MG/ML IV SOLN
INTRAVENOUS | Status: DC | PRN
  Administered 2014-09-20: 80 mg via INTRAVENOUS

## 2014-09-20 MED ORDER — SODIUM CHLORIDE 0.9 % IR SOLN
Status: DC | PRN
  Administered 2014-09-20: 1000 mL

## 2014-09-20 MED ORDER — OXYCODONE HCL 10 MG PO TABS: 10.0000 mg | ORAL_TABLET | Freq: Four times a day (QID) | ORAL | Status: DC | PRN

## 2014-09-20 MED ORDER — CHLORHEXIDINE GLUCONATE CLOTH 2 % EX PADS: 6.0000 | MEDICATED_PAD | Freq: Once | CUTANEOUS | Status: DC

## 2014-09-20 MED ORDER — HYDROMORPHONE HCL 1 MG/ML IJ SOLN
INTRAMUSCULAR | Status: AC
  Filled 2014-09-20: qty 1

## 2014-09-20 MED ORDER — PHENYLEPHRINE HCL 10 MG/ML IJ SOLN
INTRAMUSCULAR | Status: DC | PRN
  Administered 2014-09-20: 40 ug via INTRAVENOUS
  Administered 2014-09-20 (×2): 80 ug via INTRAVENOUS
  Administered 2014-09-20: 40 ug via INTRAVENOUS
  Administered 2014-09-20 (×2): 80 ug via INTRAVENOUS

## 2014-09-20 MED ORDER — HYDROMORPHONE HCL 1 MG/ML IJ SOLN
INTRAMUSCULAR | Status: AC
  Administered 2014-09-20: 0.5 mg via INTRAVENOUS
  Filled 2014-09-20: qty 1

## 2014-09-20 MED ORDER — SODIUM CHLORIDE 0.9 % IV SOLN
INTRAVENOUS | Status: DC
  Administered 2014-09-20: 10 mL/h via INTRAVENOUS

## 2014-09-20 MED ORDER — HEPARIN SODIUM (PORCINE) 1000 UNIT/ML IJ SOLN
INTRAMUSCULAR | Status: AC
  Filled 2014-09-20: qty 1

## 2014-09-20 SURGICAL SUPPLY — 33 items
ARMBAND PINK RESTRICT EXTREMIT (MISCELLANEOUS) ×3 IMPLANT
CANISTER SUCTION 2500CC (MISCELLANEOUS) ×3 IMPLANT
CATH EMB 4FR 80CM (CATHETERS) ×3 IMPLANT
CLIP TI MEDIUM 6 (CLIP) ×3 IMPLANT
CLIP TI WIDE RED SMALL 6 (CLIP) ×3 IMPLANT
ELECT REM PT RETURN 9FT ADLT (ELECTROSURGICAL) ×3
ELECTRODE REM PT RTRN 9FT ADLT (ELECTROSURGICAL) ×2 IMPLANT
GLOVE BIO SURGEON STRL SZ 6.5 (GLOVE) ×6 IMPLANT
GLOVE BIO SURGEON STRL SZ7.5 (GLOVE) ×3 IMPLANT
GLOVE BIOGEL PI IND STRL 6.5 (GLOVE) ×2 IMPLANT
GLOVE BIOGEL PI IND STRL 8 (GLOVE) ×4 IMPLANT
GLOVE BIOGEL PI INDICATOR 6.5 (GLOVE) ×1
GLOVE BIOGEL PI INDICATOR 8 (GLOVE) ×2
GLOVE SS BIOGEL STRL SZ 7 (GLOVE) ×2 IMPLANT
GLOVE SUPERSENSE BIOGEL SZ 7 (GLOVE) ×1
GLOVE SURG SS PI 7.0 STRL IVOR (GLOVE) ×6 IMPLANT
GOWN STRL REUS W/ TWL LRG LVL3 (GOWN DISPOSABLE) ×8 IMPLANT
GOWN STRL REUS W/ TWL XL LVL3 (GOWN DISPOSABLE) ×2 IMPLANT
GOWN STRL REUS W/TWL LRG LVL3 (GOWN DISPOSABLE) ×4
GOWN STRL REUS W/TWL XL LVL3 (GOWN DISPOSABLE) ×1
GRAFT GORETEX STRT 4-7X45 (Vascular Products) ×3 IMPLANT
KIT BASIN OR (CUSTOM PROCEDURE TRAY) ×3 IMPLANT
KIT ROOM TURNOVER OR (KITS) ×3 IMPLANT
LIQUID BAND (GAUZE/BANDAGES/DRESSINGS) ×3 IMPLANT
NS IRRIG 1000ML POUR BTL (IV SOLUTION) ×3 IMPLANT
PACK CV ACCESS (CUSTOM PROCEDURE TRAY) ×3 IMPLANT
PAD ARMBOARD 7.5X6 YLW CONV (MISCELLANEOUS) ×6 IMPLANT
SPONGE LAP 18X18 X RAY DECT (DISPOSABLE) ×3 IMPLANT
SUT PROLENE 6 0 BV (SUTURE) ×9 IMPLANT
SUT VIC AB 3-0 SH 27 (SUTURE) ×2
SUT VIC AB 3-0 SH 27X BRD (SUTURE) ×4 IMPLANT
UNDERPAD 30X30 INCONTINENT (UNDERPADS AND DIAPERS) ×3 IMPLANT
WATER STERILE IRR 1000ML POUR (IV SOLUTION) ×3 IMPLANT

## 2014-09-20 NOTE — Discharge Instructions (Signed)
° ° °  09/20/2014 Bradley [name] [id number] 21-Dec-1975  Surgeon(s): Pryor OchoaJames D Lawson, MD  Procedure(s): ATTEMPTED THROMBECTOMY LEFT BASILIC VEIN TRANSPOSITION  LIGATION OF ARTERIOVENOUS  FISTULA, LEFT UPPER ARM INSERTION OF 4-7MM X 45CM STRETCH ARTERIOVENOUS (AV) GORE-TEX GRAFT, LEFT FOREARM, BRACHIAL ARTERY TO BRACHIAL VEIN  x Do not stick graft for 4 weeks

## 2014-09-20 NOTE — Interval H&P Note (Signed)
History and Physical Interval Note:  09/20/2014 10:47 AM  Bradley [name]  has presented today for surgery, with the diagnosis of End Stage Renal Disease N18.6; Clotted left arm basilic vein transposition T82.858A  The various methods of treatment have been discussed with the patient and family. After consideration of risks, benefits and other options for treatment, the patient has consented to  Procedure(s): THROMBECTOMY BASILIC VEIN TRANSPOSITION (Left) as a surgical intervention .  The patient's history has been reviewed, patient examined, no change in status, stable for surgery.  I have reviewed the patient's chart and labs.  Questions were answered to the patient's satisfaction.     Josephina GipLawson, Dyanara Cozza

## 2014-09-20 NOTE — Transfer of Care (Signed)
Immediate Anesthesia Transfer of Care Note  Patient: Bradley [name]  Procedure(s) Performed: Procedure(s): ATTEMPTED THROMBECTOMY LEFT BASILIC VEIN TRANSPOSITION  (Left) LIGATION OF ARTERIOVENOUS  FISTULA, LEFT UPPER ARM (Left) INSERTION OF 4-7MM X 45CM STRETCH ARTERIOVENOUS (AV) GORE-TEX GRAFT, LEFT FOREARM, BRACHIAL ARTERY TO BRACHIAL VEIN (Left)  Patient Location: PACU  Anesthesia Type:General  Level of Consciousness: awake, alert  and oriented  Airway & Oxygen Therapy: Patient Spontanous Breathing and Patient connected to nasal cannula oxygen  Post-op Assessment: Report given to RN and Post -op Vital signs reviewed and stable  Post vital signs: Reviewed and stable   Last Vitals:  Filed Vitals:   09/20/14 1400  BP: 109/55  Pulse: 85  Temp:   Resp: 14    Complications: No apparent anesthesia complications

## 2014-09-20 NOTE — Anesthesia Procedure Notes (Signed)
Procedure Name: LMA Insertion Performed by: Marena ChancyBECKNER, Rolin Schult S Preoxygenation: Pre-oxygenation with 100% oxygen LMA: LMA inserted LMA Size: 5.0 Number of attempts: 1 Placement Confirmation: breath sounds checked- equal and bilateral and positive ETCO2 Tube secured with: Tape Dental Injury: Teeth and Oropharynx as per pre-operative assessment

## 2014-09-20 NOTE — Op Note (Signed)
OPERATIVE REPORT  Date of Surgery: 09/20/2014  Surgeon: Josephina GipJames Lawson, MD  Assistant: Doreatha MassedSamantha Rhyne PA  Pre-op Diagnosis: End Stage Renal Disease N18.6; Clotted left arm basilic vein transposition T82.858A  Post-op Diagnosis: End Stage Renal Disease N18.6; Clotted left arm basilic vein transposition T82.858A  Procedure: Procedure(s): ATTEMPTED THROMBECTOMY LEFT BASILIC VEIN TRANSPOSITION  LIGATION OF ARTERIOVENOUS  FISTULA, LEFT UPPER ARM INSERTION OF 4-7MM X 45CM STRETCH ARTERIOVENOUS (AV) GORE-TEX GRAFT, LEFT FOREARM, BRACHIAL ARTERY TO BRACHIAL VEIN  Anesthesia: LMA  EBL: Minimal  Complications: None  Procedure Details: The patient was taken to the operating room placed in supine position at which time satisfactory general-LMA anesthesia was measured. The left upper extremity was prepped with Betadine scrub and solution draped in routine sterile manner. A basilic vein transposition have been performed by Dr. fields 2 weeks earlier and this had thrombosed. There was an incision in the very distal upper arm over the brachial artery where the arterial anastomosis had been performed. This incision was reopened and the brachial artery was dissected free proximal and distal to the anastomosis to the basilic vein. There was also an an old stump of the ligated brachial-cephalic AV fistula which had thrombosed. Artery was occluded proximal and distally opening made in the fistula adjacent to the anastomosis. The vein was very marginal in size and was somewhat thickened. 4 Fogarty catheter wouldn't traverse the vein up into the axilla but the vein felt very irregular and thickened. It did not feel that it was suitable for long-term patency as a fistula therefore it was ligated. The brachial vein had been identified when dissecting free the artery which was 5 mm in size it was encircled vessel loops proximally and distally. Curvilinear tunnel in the forearm was then created and a 4 x 7 stretch  Gore-Tex graft delivered through the tunnel the patient was given no heparin. Artery was occluded proximally and distally the basilic vein was completely excised from the brachial artery a 4 Fogarty catheter passed proximally and distally. There was satisfactory inflow and good backbleeding. Thereasa DistanceRodney was slightly extended and the 4 mm in the graft was spatulated and anastomosed end-to-side with 60 proline. Following this the vein was ligated distally opened 15 blade extended with the Potts scissors it would accept a 5 dilator. Gore-Tex was spatulated and anastomosed end-to-side with 60 proline clamps released there was good pulse and thrill in the graft. There was audible Doppler flow in the radial and ulnar arteries distally which improved with compression of the graft. Adequate hemostasis was achieved and the wounds were closed in layers with Vicryls in a: A subcuticular fashion with Dermabond patient taken to recovery room in satisfactory condition   Josephina GipJames Lawson, MD 09/20/2014 12:57 PM

## 2014-09-20 NOTE — Anesthesia Preprocedure Evaluation (Addendum)
Anesthesia Evaluation  Patient identified by MRN, date of birth, ID band Patient awake    Reviewed: Allergy & Precautions, H&P , NPO status , Patient's Chart, lab work & pertinent test results  Airway Mallampati: II  TM Distance: >3 FB Neck ROM: Full    Dental no notable dental hx. (+) Teeth Intact, Dental Advisory Given   Pulmonary asthma , sleep apnea , Current Smoker,  breath sounds clear to auscultation  Pulmonary exam normal       Cardiovascular hypertension, Pt. on medications Rhythm:Regular Rate:Normal     Neuro/Psych  Headaches, negative psych ROS   GI/Hepatic negative GI ROS, Neg liver ROS,   Endo/Other  Morbid obesity  Renal/GU ESRF and DialysisRenal disease  negative genitourinary   Musculoskeletal  (+) Arthritis -, Osteoarthritis,    Abdominal   Peds  Hematology negative hematology ROS (+)   Anesthesia Other Findings   Reproductive/Obstetrics negative OB ROS                            Anesthesia Physical Anesthesia Plan  ASA: III  Anesthesia Plan: General   Post-op Pain Management:    Induction: Intravenous  Airway Management Planned: LMA and Oral ETT  Additional Equipment:   Intra-op Plan:   Post-operative Plan: Extubation in OR  Informed Consent: I have reviewed the patients History and Physical, chart, labs and discussed the procedure including the risks, benefits and alternatives for the proposed anesthesia with the patient or authorized representative who has indicated his/her understanding and acceptance.   Dental advisory given  Plan Discussed with: CRNA  Anesthesia Plan Comments:         Anesthesia Quick Evaluation

## 2014-09-20 NOTE — H&P (View-Only) (Signed)
VASCULAR SURGERY:  This is a 38-year-old gentleman who was seen in the office on 09/18/2014 by Dr. Brabham.  He had a left basilic vein transposition created by Dr. Fields on 09/05/2014. This was occluded. His nephrologist felt that he may be hypercoagulable and the plan was to attempt thrombectomy and then start him on Coumadin. He was set up for a thrombectomy of his left basilic vein transposition today. He does nocturnal dialysis on Monday Wednesdays and Fridays and finished his last dialysis early this morning at 2:30 AM. His potassium in short stay was 6.3. Repeat potassium was 6.2. In addition he had peaked T waves. For this reason, his surgery was canceled. He is being sent urgently to the dialysis Center now for dialysis using his catheter. He is a student and has an exam on Thursday. Therefore we're trying to arrange for thrombectomy of his graft on Wednesday or Friday and he would prefer Wednesday. We are trying to make those arrangements.  Christopher Dickson, MD, FACS Beeper [phone] Office: [phone]  

## 2014-09-20 NOTE — Anesthesia Postprocedure Evaluation (Signed)
  Anesthesia Post-op Note  Patient: Bradley [name]  Procedure(s) Performed: Procedure(s): ATTEMPTED THROMBECTOMY LEFT BASILIC VEIN TRANSPOSITION  (Left) LIGATION OF ARTERIOVENOUS  FISTULA, LEFT UPPER ARM (Left) INSERTION OF 4-7MM X 45CM STRETCH ARTERIOVENOUS (AV) GORE-TEX GRAFT, LEFT FOREARM, BRACHIAL ARTERY TO BRACHIAL VEIN (Left)  Patient Location: PACU  Anesthesia Type:General  Level of Consciousness: awake and alert   Airway and Oxygen Therapy: Patient Spontanous Breathing  Post-op Pain: Controlled  Post-op Assessment: Post-op Vital signs reviewed, Patient's Cardiovascular Status Stable and Respiratory Function Stable  Post-op Vital Signs: Reviewed  Filed Vitals:   09/20/14 1430  BP:   Pulse: 83  Temp:   Resp: 14    Complications: No apparent anesthesia complications

## 2014-09-21 ENCOUNTER — Encounter (HOSPITAL_COMMUNITY): Payer: Self-pay | Admitting: Vascular Surgery

## 2014-09-21 ENCOUNTER — Telehealth: Payer: Self-pay | Admitting: Vascular Surgery

## 2014-09-21 NOTE — Telephone Encounter (Signed)
LM for pt re appt, dpm °

## 2014-09-21 NOTE — Telephone Encounter (Signed)
-----   Message from Phillips Odorarol S Pullins, RN sent at 09/20/2014  2:55 PM EDT ----- Regarding: needs 3 wk. f/u appt. with JDL   ----- Message -----    From: Dara LordsSamantha J Rhyne, PA-C    Sent: 09/20/2014  12:45 PM      To: Vvs Charge Pool  Please have this pt f/u in 3 weeks with Dr. Hart RochesterLawson.  S/p new left FA AVG 09/20/14.  Thanks, Lelon MastSamantha

## 2014-09-28 ENCOUNTER — Ambulatory Visit: Payer: Medicare Other | Admitting: Vascular Surgery

## 2014-10-03 ENCOUNTER — Emergency Department (HOSPITAL_COMMUNITY)
Admission: EM | Admit: 2014-10-03 | Discharge: 2014-10-03 | Disposition: A | Discharge status: Home / Self Care | Payer: Medicare Other | Attending: Emergency Medicine | Admitting: Emergency Medicine

## 2014-10-03 ENCOUNTER — Encounter (HOSPITAL_COMMUNITY): Payer: Self-pay | Admitting: Neurology

## 2014-10-03 DIAGNOSIS — Z7901 Long term (current) use of anticoagulants: Secondary | ICD-10-CM | POA: Insufficient documentation

## 2014-10-03 DIAGNOSIS — Z79899 Other long term (current) drug therapy: Secondary | ICD-10-CM | POA: Insufficient documentation

## 2014-10-03 DIAGNOSIS — M1990 Unspecified osteoarthritis, unspecified site: Secondary | ICD-10-CM | POA: Diagnosis not present

## 2014-10-03 DIAGNOSIS — T8131XA Disruption of external operation (surgical) wound, not elsewhere classified, initial encounter: Secondary | ICD-10-CM | POA: Insufficient documentation

## 2014-10-03 DIAGNOSIS — J45909 Unspecified asthma, uncomplicated: Secondary | ICD-10-CM | POA: Insufficient documentation

## 2014-10-03 DIAGNOSIS — Z720 Tobacco use: Secondary | ICD-10-CM | POA: Diagnosis not present

## 2014-10-03 DIAGNOSIS — Z8669 Personal history of other diseases of the nervous system and sense organs: Secondary | ICD-10-CM | POA: Insufficient documentation

## 2014-10-03 DIAGNOSIS — Z880 Allergy status to penicillin: Secondary | ICD-10-CM | POA: Diagnosis not present

## 2014-10-03 DIAGNOSIS — N186 End stage renal disease: Secondary | ICD-10-CM | POA: Insufficient documentation

## 2014-10-03 DIAGNOSIS — I120 Hypertensive chronic kidney disease with stage 5 chronic kidney disease or end stage renal disease: Secondary | ICD-10-CM | POA: Insufficient documentation

## 2014-10-03 DIAGNOSIS — G8918 Other acute postprocedural pain: Secondary | ICD-10-CM

## 2014-10-03 DIAGNOSIS — Z7982 Long term (current) use of aspirin: Secondary | ICD-10-CM | POA: Diagnosis not present

## 2014-10-03 DIAGNOSIS — Y832 Surgical operation with anastomosis, bypass or graft as the cause of abnormal reaction of the patient, or of later complication, without mention of misadventure at the time of the procedure: Secondary | ICD-10-CM | POA: Diagnosis not present

## 2014-10-03 LAB — CBC WITH DIFFERENTIAL/PLATELET
BASOS ABS: 0 10*3/uL (ref 0.0–0.1)
Basophils Relative: 1 % (ref 0–1)
EOS ABS: 0.2 10*3/uL (ref 0.0–0.7)
EOS PCT: 4 % (ref 0–5)
HCT: 45 % (ref 39.0–52.0)
HEMOGLOBIN: 15.2 g/dL (ref 13.0–17.0)
Lymphocytes Relative: 24 % (ref 12–46)
Lymphs Abs: 1.4 10*3/uL (ref 0.7–4.0)
MCH: 30.2 pg (ref 26.0–34.0)
MCHC: 33.8 g/dL (ref 30.0–36.0)
MCV: 89.3 fL (ref 78.0–100.0)
Monocytes Absolute: 0.4 10*3/uL (ref 0.1–1.0)
Monocytes Relative: 7 % (ref 3–12)
Neutro Abs: 3.9 10*3/uL (ref 1.7–7.7)
Neutrophils Relative %: 64 % (ref 43–77)
Platelets: 216 10*3/uL (ref 150–400)
RBC: 5.04 MIL/uL (ref 4.22–5.81)
RDW: 14.1 % (ref 11.5–15.5)
WBC: 6 10*3/uL (ref 4.0–10.5)

## 2014-10-03 LAB — BASIC METABOLIC PANEL
ANION GAP: 18 — AB (ref 5–15)
BUN: 58 mg/dL — ABNORMAL HIGH (ref 6–20)
CO2: 24 mmol/L (ref 22–32)
Calcium: 9.9 mg/dL (ref 8.9–10.3)
Chloride: 92 mmol/L — ABNORMAL LOW (ref 101–111)
Creatinine, Ser: 12.73 mg/dL — ABNORMAL HIGH (ref 0.61–1.24)
GFR calc Af Amer: 5 mL/min — ABNORMAL LOW (ref >60)
GFR calc non Af Amer: 4 mL/min — ABNORMAL LOW (ref >60)
GLUCOSE: 104 mg/dL — AB (ref 65–99)
Potassium: 5.2 mmol/L — ABNORMAL HIGH (ref 3.5–5.1)
SODIUM: 134 mmol/L — AB (ref 135–145)

## 2014-10-03 MED ORDER — OXYCODONE HCL 5 MG PO TABS
5.0000 mg | ORAL_TABLET | Freq: Once | ORAL | Status: AC
  Administered 2014-10-03: 5 mg via ORAL
  Filled 2014-10-03: qty 1

## 2014-10-03 MED ORDER — OXYCODONE HCL 5 MG PO TABS: 5.0000 mg | ORAL_TABLET | Freq: Four times a day (QID) | ORAL | Status: DC | PRN

## 2014-10-03 NOTE — Consult Note (Signed)
[facility]  Patient name: Bradley [name] MRN: [id number] DOB: 1975-04-03 Sex: male  REASON FOR CONSULT: dehiscence of wound in left arm  HPI: Bradley [name] is a 39 y.o. male who underwent attempted thrombectomy of a left basilic vein transposition. This was not successful and the fistula was ligated and a left forearm AV graft was placed by Dr. Hart Rochester. This was on 09/20/2014. He noted some separation of his wound today and presented to the emergency department. I was asked to evaluate the wound. He denies fever or chills. He denies significant drainage from the wound.  Past Medical History  Diagnosis Date  . Hypertension   . Asthma     Hx: of as a child  . Sleep apnea   . Headache(784.0)   . Arthritis   . Back pain     Hx: of back injury  . Family history of adverse reaction to anesthesia     Mother- awoke during leg amputation, per mother  . Renal disorder     ESRD    Family History  Problem Relation Age of Onset  . Hypertension Mother   . Peripheral vascular disease Mother   . Diabetes Father   . Hyperlipidemia Father   . Hypertension Father   . Peripheral vascular disease Maternal Grandmother     SOCIAL HISTORY: History  Substance Use Topics  . Smoking status: Current Some Day Smoker -- 0.50 packs/day for 15 years    Types: Cigarettes  . Smokeless tobacco: Never Used  . Alcohol Use: 0.6 oz/week    1 Cans of beer per week    Allergies  Allergen Reactions  . Penicillins Anaphylaxis  . Wellbutrin [Bupropion] Rash    No current facility-administered medications for this encounter.   Current Outpatient Prescriptions  Medication Sig Dispense Refill  . amLODipine (NORVASC) 10 MG tablet Take 10 mg by mouth daily.  0  . aspirin EC 325 MG tablet Take 325 mg by mouth daily.    . calcium acetate (PHOSLO) 667 MG capsule Take 1,334 mg by mouth 5 (five) times daily. With meals and with snacks  0  . cinacalcet (SENSIPAR) 90 MG tablet  Take 90 mg by mouth daily.     . folic acid (FOLVITE) 1 MG tablet Take 1 mg by mouth daily.    . multivitamin (RENA-VIT) TABS tablet Take 1 tablet by mouth daily.      . Oxycodone HCl 10 MG TABS Take 1 tablet (10 mg total) by mouth every 6 (six) hours as needed (moderate or severe pain). 30 tablet 0  . sucroferric oxyhydroxide (VELPHORO) 500 MG chewable tablet Chew 500-1,000 mg by mouth See admin instructions. Take 2 tablets (1000 mg) with meals and 1 tablet (500 mg) with snacks    . warfarin (COUMADIN) 5 MG tablet Take 5-10 mg by mouth See admin instructions. To start after procedure: take 2 tablets (10 mg) by mouth  daily for 3 days, then take 1 tablet (5 mg) daily  0    REVIEW OF SYSTEMS: Arly.Keller ] denotes positive finding; [  ] denotes negative finding CARDIOVASCULAR:   chest pain    chest pressure    palpitations    orthopnea    dyspnea on exertion    claudication    rest pain    DVT    phlebitis PULMONARY:    productive cough    asthma     wheezing NEUROLOGIC:   [ ]  weakness  [ ]  paresthesias  [ ]  aphasia  [ ]  amaurosis  [ ]  dizziness HEMATOLOGIC:   [ ]  bleeding problems   [ ]  clotting disorders MUSCULOSKELETAL:  [ ]  joint pain   [ ]  joint swelling [ ]  leg swelling GASTROINTESTINAL: [ ]   blood in stool  [ ]   hematemesis GENITOURINARY:  [ ]   dysuria  [ ]   hematuria PSYCHIATRIC:  [ ]  history of major depression INTEGUMENTARY:  [ ]  rashes  [ ]  ulcers CONSTITUTIONAL:  [ ]  fever   [ ]  chills  PHYSICAL EXAM: Filed Vitals:   10/03/14 1558 10/03/14 1839 10/03/14 1900 10/03/14 1917  BP: 167/96 163/92 129/65 129/65  Pulse: 98 77 88 82  Temp: 98.5 F (36.9 C)     TempSrc: Oral     Resp: 18   16  SpO2: 97% 98% 98% 97%   There is no weight on file to calculate BMI. GENERAL: The patient is a well-nourished male, in no acute distress. The vital signs are documented above. CARDIOVASCULAR: There is a regular rate and rhythm.  PULMONARY: There is good air exchange  bilaterally without wheezing or rales. SKIN: There is some slight separation of the superficial layer only of the wound at the antecubital level. There was no separation of the deep layer. There is no exposed graft. There is a good thrill in his left forearm AV graft.  DATA:  Lab Results  Component Value Date   WBC 6.0 10/03/2014   HGB 15.2 10/03/2014   HCT 45.0 10/03/2014   MCV 89.3 10/03/2014   PLT 216 10/03/2014   Lab Results  Component Value Date   NA 134* 10/03/2014   K 5.2* 10/03/2014   CL 92* 10/03/2014   CO2 24 10/03/2014   Lab Results  Component Value Date   CREATININE 12.73* 10/03/2014   Lab Results  Component Value Date   INR 1.07 08/08/2014   MEDICAL ISSUES: STATUS POST PLACEMENT OF LEFT FOREARM AV GRAFT: He has some slight separation of the wound at the antecubital level. I Steri-Stripped the wound proximally and distally and instructed him on how to place bacitracin to the central portion and cover this with a Band-Aid. He'll keep this protected with gauze and a four-inch Ace which is not wrapped too tight. He dialyzes on Monday Wednesdays and Fridays at night and has requested that I see him on a Wednesday. I'll see him 1 week from Wednesday.  Bradley [name], Bradley [name]: [phone]

## 2014-10-03 NOTE — ED Provider Notes (Signed)
CSN: [id number]     Arrival date & time 10/03/14  1533 History   First MD Initiated Contact with Patient 10/03/14 1843     Chief Complaint  Patient presents with  . Post-op Problem   (Consider location/radiation/quality/duration/timing/severity/associated sxs/prior Treatment)  Patient is a 39 y.o. male presenting with wound check. The history is provided by the patient. No language interpreter was used.  Wound Check This is a new problem. The current episode started today. The problem occurs constantly. The problem has been unchanged. Pertinent negatives include no abdominal pain, anorexia, change in bowel habit, chest pain, chills, coughing, fatigue, fever, headaches, joint swelling, nausea, vertigo or vomiting. Exacerbated by: extending left arm/ stretching skin. He has tried nothing for the symptoms.    Past Medical History  Diagnosis Date  . Hypertension   . Asthma     Hx: of as a child  . Sleep apnea   . Headache(784.0)   . Arthritis   . Back pain     Hx: of back injury  . Family history of adverse reaction to anesthesia     Mother- awoke during leg amputation, per mother  . Renal disorder     ESRD   Past Surgical History  Procedure Laterality Date  . Dg av dialysis  shunt access exist*l* or      left- 07/2014  . Appendectomy    . Bascilic vein transposition Right 07/22/2012    Procedure: BASCILIC VEIN TRANSPOSITION;  Surgeon: Chuck Hint, MD;  Location: [facility] OR;  Service: Vascular;  Laterality: Right;  . Avf inserted Left     upper  . Avf   Left     Undone  . Bascilic vein transposition Left 09/05/2014    Procedure: BASCILIC VEIN TRANSPOSITION-LEFT;  Surgeon: Sherren Kerns, MD;  Location: [facility] OR;  Service: Vascular;  Laterality: Left;  . Thrombectomy w/ embolectomy Left 09/20/2014    Procedure: ATTEMPTED THROMBECTOMY LEFT BASILIC VEIN TRANSPOSITION ;  Surgeon: Pryor Ochoa, MD;  Location: [facility] OR;  Service: Vascular;  Laterality: Left;  . Ligation of  arteriovenous  fistula Left 09/20/2014    Procedure: LIGATION OF ARTERIOVENOUS  FISTULA, LEFT UPPER ARM;  Surgeon: Pryor Ochoa, MD;  Location: [facility] OR;  Service: Vascular;  Laterality: Left;  . Av fistula placement Left 09/20/2014    Procedure: INSERTION OF 4-7MM X 45CM STRETCH ARTERIOVENOUS (AV) GORE-TEX GRAFT, LEFT FOREARM, BRACHIAL ARTERY TO BRACHIAL VEIN;  Surgeon: Pryor Ochoa, MD;  Location: [facility] OR;  Service: Vascular;  Laterality: Left;   Family History  Problem Relation Age of Onset  . Hypertension Mother   . Peripheral vascular disease Mother   . Diabetes Father   . Hyperlipidemia Father   . Hypertension Father   . Peripheral vascular disease Maternal Grandmother    History  Substance Use Topics  . Smoking status: Current Some Day Smoker -- 0.50 packs/day for 15 years    Types: Cigarettes  . Smokeless tobacco: Never Used  . Alcohol Use: 0.6 oz/week    1 Cans of beer per week    Review of Systems  Constitutional: Negative for fever, chills and fatigue.  Respiratory: Negative for cough, chest tightness and shortness of breath.   Cardiovascular: Negative for chest pain.  Gastrointestinal: Negative for nausea, vomiting, abdominal pain, anorexia and change in bowel habit.  Musculoskeletal: Negative for joint swelling.  Skin: Positive for wound (LUE).  Neurological: Negative for vertigo, light-headedness and headaches.  Psychiatric/Behavioral: Negative for confusion.  All other  systems reviewed and are negative.     Allergies  Penicillins and Wellbutrin  Home Medications   Prior to Admission medications   Medication Sig Start Date End Date Taking? Authorizing Provider  amLODipine (NORVASC) 10 MG tablet Take 10 mg by mouth daily. 08/05/14   Historical Provider, MD  aspirin EC 325 MG tablet Take 325 mg by mouth daily.    Historical Provider, MD  calcium acetate (PHOSLO) 667 MG capsule Take 1,334 mg by mouth 5 (five) times daily. With meals and with snacks 09/15/14    Historical Provider, MD  cinacalcet (SENSIPAR) 90 MG tablet Take 90 mg by mouth daily.     Historical Provider, MD  folic acid (FOLVITE) 1 MG tablet Take 1 mg by mouth daily.    Historical Provider, MD  multivitamin (RENA-VIT) TABS tablet Take 1 tablet by mouth daily.      Historical Provider, MD  Oxycodone HCl 10 MG TABS Take 1 tablet (10 mg total) by mouth every 6 (six) hours as needed (moderate or severe pain). 09/20/14   Samantha J Rhyne, PA-C  sucroferric oxyhydroxide (VELPHORO) 500 MG chewable tablet Chew 500-1,000 mg by mouth See admin instructions. Take 2 tablets (1000 mg) with meals and 1 tablet (500 mg) with snacks    Historical Provider, MD  warfarin (COUMADIN) 5 MG tablet Take 5-10 mg by mouth See admin instructions. To start after procedure: take 2 tablets (10 mg) by mouth  daily for 3 days, then take 1 tablet (5 mg) daily 09/15/14   Historical Provider, MD   BP 163/92 mmHg  Pulse 77  Temp(Src) 98.5 F (36.9 C) (Oral)  Resp 18  SpO2 98% Physical Exam  Constitutional: He is oriented to person, place, and time. He appears well-developed and well-nourished. No distress.  HENT:  Head: Normocephalic and atraumatic.  Nose: Nose normal.  Mouth/Throat: Oropharynx is clear and moist. No oropharyngeal exudate.  Eyes: EOM are normal. Pupils are equal, round, and reactive to light.  Neck: Normal range of motion. Neck supple.  Cardiovascular: Normal rate, regular rhythm, normal heart sounds and intact distal pulses.   No murmur heard. Palpable thrill at AVF on distal LUE  Pulmonary/Chest: Effort normal and breath sounds normal. No respiratory distress. He has no wheezes. He exhibits no tenderness.  Abdominal: Soft. He exhibits no distension. There is no tenderness. There is no guarding.  Musculoskeletal: Normal range of motion. He exhibits no tenderness.  Neurological: He is alert and oriented to person, place, and time. No cranial nerve deficit. Coordination normal.  Skin: Skin is warm  and dry. He is not diaphoretic. No pallor.     Psychiatric: He has a normal mood and affect. His behavior is normal. Judgment and thought content normal.  Nursing note and vitals reviewed.   ED Course  Procedures (including critical care time) Labs Review Labs Reviewed  BASIC METABOLIC PANEL - Abnormal; Notable for the following:    Sodium 134 (*)    Potassium 5.2 (*)    Chloride 92 (*)    Glucose, Bld 104 (*)    BUN 58 (*)    Creatinine, Ser 12.73 (*)    GFR calc non Af Amer 4 (*)    GFR calc Af Amer 5 (*)    Anion gap 18 (*)    All other components within normal limits  CBC WITH DIFFERENTIAL/PLATELET   Imaging Review No results found.   EKG Interpretation None      MDM   Final diagnoses:  Post-op  pain  Wound dehiscence, initial encounter    Pt is a 40 yo M with hx of ESRD who presents with a fistula problem.  Had a left upper arm AVF ligation and AV graft from brachial artery to brachial vein by Dr. Hart Rochester (vasc surgery) on 6/29.  Presents today due to LUE pain after his surgical incision dehisced.  Took off his LUE sling and extended his left elbow, then felt a pop and pain at the site.  It has been hemostatic but tender.   No signs of infection.  Looks like the dermabond/superficial sutures have just dehisced.   Provided with roxicodone 5 mg for pain.  Spoke to vascular surgery at 1920.  To see patient in the ED  Closed with steri-strips and covered with dressing by vascular surgery team.  To follow up with vascular surgery this week for a wound check.   Given Rx for roxicodone and discussed risks of narcotics.  Advised patient on ED return precautions.  All questions answered.   Patient was seen with ED Attending, Dr. Sharl Ma, MD   Lenell Antu, MD 10/04/14 0454  Dione Booze, MD 10/05/14 [phone]

## 2014-10-03 NOTE — ED Notes (Signed)
[facility] No active bleeding but does have some white drainage from site.

## 2014-10-03 NOTE — ED Notes (Signed)
Pt reports last week had graft placed to left arm for dialysis, today noticed that the site was open about 1 inch. Site is painful, arm is swollen and he reports it has been since last week. Last had dialysis yesterday via chest site.

## 2014-10-06 ENCOUNTER — Telehealth: Payer: Self-pay | Admitting: Vascular Surgery

## 2014-10-06 NOTE — Telephone Encounter (Signed)
-----   Message from Sharee PimpleMarilyn K McChesney, RN sent at 10/04/2014  9:17 AM EDT ----- Regarding: Schedule   ----- Message -----    From: [name], MD    Sent: 10/03/2014   7:49 PM      To: Vvs Charge Pool Subject: charge? and f/u                                I saw this patient in the emergency department with some slight separation of his wound in the left arm. I instructed him on dressing changes and I need to see him a week from Wednesday. He has requested that I see him although Dr. Hart RochesterLawson did his surgery. He dialyzes at night on Monday Wednesdays and Fridays and does not think that there would be a problem for me to see him on a Wednesday. Thank you. CD

## 2014-10-06 NOTE — Telephone Encounter (Signed)
Spoke with pt to schedule, dm °

## 2014-10-09 ENCOUNTER — Encounter: Payer: Self-pay | Admitting: Vascular Surgery

## 2014-10-10 ENCOUNTER — Encounter: Payer: Medicare Other | Admitting: Vascular Surgery

## 2014-10-11 ENCOUNTER — Encounter: Payer: Self-pay | Admitting: Vascular Surgery

## 2014-10-11 ENCOUNTER — Ambulatory Visit (INDEPENDENT_AMBULATORY_CARE_PROVIDER_SITE_OTHER): Payer: Self-pay | Admitting: Vascular Surgery

## 2014-10-11 VITALS — BP 135/86 | HR 101 | Temp 98.2°F | Ht 72.0 in | Wt 344.2 lb

## 2014-10-11 DIAGNOSIS — N186 End stage renal disease: Secondary | ICD-10-CM

## 2014-10-11 NOTE — Progress Notes (Signed)
   Patient name: Bradley [name] MRN: [id number] DOB: 09-17-1975 Sex: male  REASON FOR VISIT: Follow up of left arm wound.  HPI: Bradley [name] is a 39 y.o. male who I saw in the emergency department on 10/03/2014. He had undergone thrombectomy of his left basilic vein transposition by Dr. Hart RochesterLawson but this was not successful therefore the fistula was ligated and a left forearm graft was placed. His surgery was on 09/20/2014. He noted some separation from his wound on 10/03/2014 and I evaluated the wound in the ER. There was only slight separation and I recommended dressing changes and he comes in for a follow up visit.  He has been doing the dressing changes himself. He denies fever or chills.  Current Outpatient Prescriptions  Medication Sig Dispense Refill  . amLODipine (NORVASC) 10 MG tablet Take 10 mg by mouth daily.  0  . aspirin EC 325 MG tablet Take 325 mg by mouth daily.    . calcium acetate (PHOSLO) 667 MG capsule Take 1,334 mg by mouth 5 (five) times daily. With meals and with snacks  0  . cinacalcet (SENSIPAR) 90 MG tablet Take 90 mg by mouth daily.     . folic acid (FOLVITE) 1 MG tablet Take 1 mg by mouth daily.    . multivitamin (RENA-VIT) TABS tablet Take 1 tablet by mouth daily.      Marland Kitchen. oxyCODONE (OXY IR/ROXICODONE) 5 MG immediate release tablet Take 1 tablet (5 mg total) by mouth every 6 (six) hours as needed for severe pain. 15 tablet 0  . sucroferric oxyhydroxide (VELPHORO) 500 MG chewable tablet Chew 500-1,000 mg by mouth See admin instructions. Take 2 tablets (1000 mg) with meals and 1 tablet (500 mg) with snacks    . warfarin (COUMADIN) 5 MG tablet Take 5-10 mg by mouth See admin instructions. To start after procedure: take 2 tablets (10 mg) by mouth  daily for 3 days, then take 1 tablet (5 mg) daily  0   No current facility-administered medications for this visit.   REVIEW OF SYSTEMS: Arly.Keller[X ] denotes positive finding; [  ] denotes negative finding  CARDIOVASCULAR:  [ ]  chest  pain   [ ]  dyspnea on exertion    CONSTITUTIONAL:  [ ]  fever   [ ]  chills  PHYSICAL EXAM: Filed Vitals:   10/11/14 0924  BP: 135/86  Pulse: 101  Height: 6' (1.829 m)  Weight: 344 lb 3.2 oz (156.128 kg)  SpO2: 98%   GENERAL: The patient is a well-nourished male, in no acute distress. The vital signs are documented above. CARDIOVASCULAR: There is a regular rate and rhythm. PULMONARY: There is good air exchange bilaterally without wheezing or rales. The wound is clean and has not separated any further. He has moderate left arm swelling. The graft has an audible bruit.  MEDICAL ISSUES: I have recommended that he continue his dressing changes to the left arm and continue elevation of the arm. I have asked to see him back in 3 weeks. If he is continuing to have arm swelling we could consider a fistulogram to look for central venous stenosis that could be addressed with venoplasty.  Waverly Ferrariickson, [facility] [place] Beeper: [phone]

## 2014-10-12 ENCOUNTER — Encounter: Payer: Medicare Other | Admitting: Vascular Surgery

## 2014-11-07 ENCOUNTER — Encounter: Payer: Self-pay | Admitting: Vascular Surgery

## 2014-11-08 ENCOUNTER — Ambulatory Visit (INDEPENDENT_AMBULATORY_CARE_PROVIDER_SITE_OTHER): Payer: Medicare Other | Admitting: Vascular Surgery

## 2014-11-08 ENCOUNTER — Encounter: Payer: Self-pay | Admitting: Vascular Surgery

## 2014-11-08 VITALS — BP 133/79 | HR 105 | Temp 98.3°F | Resp 18 | Ht 72.0 in | Wt 346.0 lb

## 2014-11-08 DIAGNOSIS — N186 End stage renal disease: Secondary | ICD-10-CM

## 2014-11-08 NOTE — Progress Notes (Signed)
   Patient name: Bradley [name] MRN: [id number] DOB: [birth year] Sex: male  REASON FOR VISIT: follow up of left AV graft.  HPI: IHAN PAT is a 39 y.o. male this patient underwent attempted thrombectomy of a  left basilic vein transposition by Dr. Hart Rochester on 09/20/2014. This was not successful and she required placement of a 4-7 mm PTFE graft in the left forearm at the same time. I saw the patient in the emergency department with some dehiscence of the wound in the left arm. He comes in for a follow up visit. In addition he had swelling in the left arm. This has improved since his catheter was removed from his left IJ in place in his left groin. He states that the wound in his antecubital level has improved significantly.  Current Outpatient Prescriptions  Medication Sig Dispense Refill  . amLODipine (NORVASC) 10 MG tablet Take 10 mg by mouth daily.  0  . aspirin EC 325 MG tablet Take 325 mg by mouth daily.    . calcium acetate (PHOSLO) 667 MG capsule Take 1,334 mg by mouth 5 (five) times daily. With meals and with snacks  0  . cinacalcet (SENSIPAR) 90 MG tablet Take 90 mg by mouth daily.     . folic acid (FOLVITE) 1 MG tablet Take 1 mg by mouth daily.    . multivitamin (RENA-VIT) TABS tablet Take 1 tablet by mouth daily.      Marland Kitchen oxyCODONE (OXY IR/ROXICODONE) 5 MG immediate release tablet Take 1 tablet (5 mg total) by mouth every 6 (six) hours as needed for severe pain. 15 tablet 0  . sucroferric oxyhydroxide (VELPHORO) 500 MG chewable tablet Chew 500-1,000 mg by mouth See admin instructions. Take 2 tablets (1000 mg) with meals and 1 tablet (500 mg) with snacks    . warfarin (COUMADIN) 5 MG tablet Take 5-10 mg by mouth See admin instructions. To start after procedure: take 2 tablets (10 mg) by mouth  daily for 3 days, then take 1 tablet (5 mg) daily  0   No current facility-administered medications for this visit.   REVIEW OF SYSTEMS: Arly.Keller ] denotes positive finding; [  ] denotes negative finding    CARDIOVASCULAR:   chest pain    dyspnea on exertion    CONSTITUTIONAL:   fever    chills  PHYSICAL EXAM: Filed Vitals:   11/08/14 0951  BP: 133/79  Pulse: 105  Temp: 98.3 F (36.8 C)  TempSrc: Oral  Resp: 18  Height: 6' (1.829 m)  Weight: 346 lb (156.945 kg)  SpO2: 100%   GENERAL: The patient is a well-nourished male, in no acute distress. The vital signs are documented above. CARDIOVASCULAR: There is a regular rate and rhythm. PULMONARY: There is good air exchange bilaterally without wheezing or rales. The wound at the antecubital level measures only about 5 mm in diameter and appears to have almost healed. Swelling in his left arm is better. His left forearm graft has a bruit and thrill.  MEDICAL ISSUES: At this point I think it is reasonable to begin using his left forearm graft as long as we keep sterile dressing over the small open wound at the antecubital level. Once the graft is working his thigh catheter can be removed. I will see him back as needed.  [name] [facility] [place] Beeper: [phone]

## 2015-02-01 ENCOUNTER — Encounter: Payer: Self-pay | Admitting: Vascular Surgery

## 2015-02-06 ENCOUNTER — Ambulatory Visit: Payer: Medicare Other | Admitting: Vascular Surgery

## 2015-03-22 ENCOUNTER — Other Ambulatory Visit: Payer: Self-pay | Admitting: Nephrology

## 2015-03-22 ENCOUNTER — Ambulatory Visit
Admission: RE | Admit: 2015-03-22 | Discharge: 2015-03-22 | Disposition: A | Discharge status: Home / Self Care | Payer: Medicare Other | Source: Ambulatory Visit | Attending: Nephrology | Admitting: Nephrology

## 2015-03-22 DIAGNOSIS — A159 Respiratory tuberculosis unspecified: Secondary | ICD-10-CM

## 2015-04-23 ENCOUNTER — Encounter (HOSPITAL_COMMUNITY): Payer: Self-pay | Admitting: *Deleted

## 2015-04-23 ENCOUNTER — Emergency Department (INDEPENDENT_AMBULATORY_CARE_PROVIDER_SITE_OTHER)
Admission: EM | Admit: 2015-04-23 | Discharge: 2015-04-23 | Disposition: A | Discharge status: Home / Self Care | Payer: Medicare Other | Source: Home / Self Care | Attending: Family Medicine | Admitting: Family Medicine

## 2015-04-23 DIAGNOSIS — R197 Diarrhea, unspecified: Secondary | ICD-10-CM

## 2015-04-23 MED ORDER — LOPERAMIDE HCL 2 MG PO CAPS: 4.0000 mg | ORAL_CAPSULE | Freq: Four times a day (QID) | ORAL | Status: AC | PRN

## 2015-04-23 MED ORDER — ALIGN 4 MG PO CAPS: 1.0000 | ORAL_CAPSULE | Freq: Two times a day (BID) | ORAL | Status: DC

## 2015-04-23 NOTE — ED Notes (Signed)
Pt reports  symptyoms  Of  diarrhea  For  1  Week        Pt  Reports         Taking  iimodium   Given to  Him  By  His  Nephrologist           Pt  Due  Dialysis       Tonight    he  Continues  To  Have  Diarrhea

## 2015-04-23 NOTE — ED Provider Notes (Signed)
CSN: [id number]     Arrival date & time 04/23/15  1448 History   First MD Initiated Contact with Patient 04/23/15 1655     Chief Complaint  Patient presents with  . Diarrhea   (Consider location/radiation/quality/duration/timing/severity/associated sxs/prior Treatment) Patient is a 40 y.o. male presenting with diarrhea. The history is provided by the patient.  Diarrhea Quality:  Watery Severity:  Moderate Onset quality:  Gradual Duration:  1 week Timing:  Constant Progression:  Unchanged Ineffective treatments:  Anti-motility medications Associated symptoms: no abdominal pain, no fever and no vomiting   Risk factors: suspect food intake     Past Medical History  Diagnosis Date  . Hypertension   . Asthma     Hx: of as a child  . Sleep apnea   . Headache(784.0)   . Arthritis   . Back pain     Hx: of back injury  . Family history of adverse reaction to anesthesia     Mother- awoke during leg amputation, per mother  . Renal disorder     ESRD   Past Surgical History  Procedure Laterality Date  . Dg av dialysis  shunt access exist*l* or      left- 07/2014  . Appendectomy    . Bascilic vein transposition Right 07/22/2012    Procedure: BASCILIC VEIN TRANSPOSITION;  Surgeon: Chuck Hint, MD;  Location: [facility] OR;  Service: Vascular;  Laterality: Right;  . Avf inserted Left     upper  . Avf   Left     Undone  . Bascilic vein transposition Left 09/05/2014    Procedure: BASCILIC VEIN TRANSPOSITION-LEFT;  Surgeon: Sherren Kerns, MD;  Location: [facility] OR;  Service: Vascular;  Laterality: Left;  . Thrombectomy w/ embolectomy Left 09/20/2014    Procedure: ATTEMPTED THROMBECTOMY LEFT BASILIC VEIN TRANSPOSITION ;  Surgeon: Pryor Ochoa, MD;  Location: [facility] OR;  Service: Vascular;  Laterality: Left;  . Ligation of arteriovenous  fistula Left 09/20/2014    Procedure: LIGATION OF ARTERIOVENOUS  FISTULA, LEFT UPPER ARM;  Surgeon: Pryor Ochoa, MD;  Location: [facility] OR;  Service: Vascular;   Laterality: Left;  . Av fistula placement Left 09/20/2014    Procedure: INSERTION OF 4-7MM X 45CM STRETCH ARTERIOVENOUS (AV) GORE-TEX GRAFT, LEFT FOREARM, BRACHIAL ARTERY TO BRACHIAL VEIN;  Surgeon: Pryor Ochoa, MD;  Location: [facility] OR;  Service: Vascular;  Laterality: Left;   Family History  Problem Relation Age of Onset  . Hypertension Mother   . Peripheral vascular disease Mother   . Diabetes Father   . Hyperlipidemia Father   . Hypertension Father   . Peripheral vascular disease Maternal Grandmother    Social History  Substance Use Topics  . Smoking status: Current Some Day Smoker -- 0.50 packs/day for 15 years    Types: Cigarettes  . Smokeless tobacco: Never Used  . Alcohol Use: 0.6 oz/week    1 Cans of beer per week    Review of Systems  Constitutional: Negative.  Negative for fever.  Gastrointestinal: Positive for diarrhea. Negative for nausea, vomiting, abdominal pain, constipation, blood in stool and anal bleeding.  Genitourinary: Negative.   All other systems reviewed and are negative.   Allergies  Penicillins and Wellbutrin  Home Medications   Prior to Admission medications   Medication Sig Start Date End Date Taking? Authorizing Provider  amLODipine (NORVASC) 10 MG tablet Take 10 mg by mouth daily. 08/05/14   Historical Provider, MD  aspirin EC 325 MG tablet Take 325  mg by mouth daily.    Historical Provider, MD  calcium acetate (PHOSLO) 667 MG capsule Take 1,334 mg by mouth 5 (five) times daily. With meals and with snacks 09/15/14   Historical Provider, MD  cinacalcet (SENSIPAR) 90 MG tablet Take 90 mg by mouth daily.     Historical Provider, MD  folic acid (FOLVITE) 1 MG tablet Take 1 mg by mouth daily.    Historical Provider, MD  loperamide (IMODIUM) 2 MG capsule Take 2 capsules (4 mg total) by mouth 4 (four) times daily as needed for diarrhea or loose stools. 04/23/15   Linna Hoff, MD  multivitamin (RENA-VIT) TABS tablet Take 1 tablet by mouth daily.       Historical Provider, MD  oxyCODONE (OXY IR/ROXICODONE) 5 MG immediate release tablet Take 1 tablet (5 mg total) by mouth every 6 (six) hours as needed for severe pain. 10/03/14   Lenell Antu, MD  Probiotic Product (ALIGN) 4 MG CAPS Take 1 capsule (4 mg total) by mouth 2 (two) times daily. 04/23/15   Linna Hoff, MD  sucroferric oxyhydroxide (VELPHORO) 500 MG chewable tablet Chew 500-1,000 mg by mouth See admin instructions. Take 2 tablets (1000 mg) with meals and 1 tablet (500 mg) with snacks    Historical Provider, MD  warfarin (COUMADIN) 5 MG tablet Take 5-10 mg by mouth See admin instructions. To start after procedure: take 2 tablets (10 mg) by mouth  daily for 3 days, then take 1 tablet (5 mg) daily 09/15/14   Historical Provider, MD   Meds Ordered and Administered this Visit  Medications - No data to display  BP 133/81 mmHg  Pulse 98  Temp(Src) 98.1 F (36.7 C) (Oral)  Resp 16  SpO2 100% No data found.   Physical Exam  Constitutional: He is oriented to person, place, and time. He appears well-developed and well-nourished.  Neck: Normal range of motion. Neck supple.  Abdominal: Soft. Bowel sounds are normal. He exhibits no distension and no mass. There is no tenderness. There is no rebound and no guarding.  Neurological: He is alert and oriented to person, place, and time.  Skin: Skin is warm and dry.  Nursing note and vitals reviewed.   ED Course  Procedures (including critical care time)  Labs Review Labs Reviewed - No data to display  Imaging Review No results found.   Visual Acuity Review  Right Eye Distance:   Left Eye Distance:   Bilateral Distance:    Right Eye Near:   Left Eye Near:    Bilateral Near:         MDM   1. Acute diarrhea        Linna Hoff, MD 04/24/15 2044

## 2015-04-23 NOTE — Discharge Instructions (Signed)
Clear liquid , bland diet tonight as tolerated, advance on tues as improved, use medicine as needed and probiotic and activia yogurt, return or see your doctor if any problems.

## 2015-04-30 ENCOUNTER — Emergency Department (HOSPITAL_COMMUNITY)
Admission: EM | Admit: 2015-04-30 | Discharge: 2015-04-30 | Disposition: A | Discharge status: Home / Self Care | Payer: Medicare Other | Attending: Emergency Medicine | Admitting: Emergency Medicine

## 2015-04-30 ENCOUNTER — Encounter (HOSPITAL_COMMUNITY): Payer: Self-pay | Admitting: Cardiology

## 2015-04-30 DIAGNOSIS — Z8669 Personal history of other diseases of the nervous system and sense organs: Secondary | ICD-10-CM | POA: Diagnosis not present

## 2015-04-30 DIAGNOSIS — Z7901 Long term (current) use of anticoagulants: Secondary | ICD-10-CM | POA: Insufficient documentation

## 2015-04-30 DIAGNOSIS — Z880 Allergy status to penicillin: Secondary | ICD-10-CM | POA: Diagnosis not present

## 2015-04-30 DIAGNOSIS — M1990 Unspecified osteoarthritis, unspecified site: Secondary | ICD-10-CM | POA: Diagnosis not present

## 2015-04-30 DIAGNOSIS — Z79899 Other long term (current) drug therapy: Secondary | ICD-10-CM | POA: Diagnosis not present

## 2015-04-30 DIAGNOSIS — L0231 Cutaneous abscess of buttock: Secondary | ICD-10-CM | POA: Insufficient documentation

## 2015-04-30 DIAGNOSIS — J45909 Unspecified asthma, uncomplicated: Secondary | ICD-10-CM | POA: Insufficient documentation

## 2015-04-30 DIAGNOSIS — Z992 Dependence on renal dialysis: Secondary | ICD-10-CM | POA: Insufficient documentation

## 2015-04-30 DIAGNOSIS — N186 End stage renal disease: Secondary | ICD-10-CM | POA: Diagnosis not present

## 2015-04-30 DIAGNOSIS — Z87828 Personal history of other (healed) physical injury and trauma: Secondary | ICD-10-CM | POA: Diagnosis not present

## 2015-04-30 DIAGNOSIS — Z7982 Long term (current) use of aspirin: Secondary | ICD-10-CM | POA: Insufficient documentation

## 2015-04-30 DIAGNOSIS — I120 Hypertensive chronic kidney disease with stage 5 chronic kidney disease or end stage renal disease: Secondary | ICD-10-CM | POA: Diagnosis not present

## 2015-04-30 DIAGNOSIS — L0291 Cutaneous abscess, unspecified: Secondary | ICD-10-CM

## 2015-04-30 DIAGNOSIS — F17210 Nicotine dependence, cigarettes, uncomplicated: Secondary | ICD-10-CM | POA: Diagnosis not present

## 2015-04-30 MED ORDER — SULFAMETHOXAZOLE-TRIMETHOPRIM 800-160 MG PO TABS: 1.0000 | ORAL_TABLET | Freq: Two times a day (BID) | ORAL | Status: AC

## 2015-04-30 MED ORDER — LIDOCAINE HCL (PF) 1 % IJ SOLN
20.0000 mL | Freq: Once | INTRAMUSCULAR | Status: AC
  Administered 2015-04-30: 20 mL via INTRADERMAL
  Filled 2015-04-30 (×2): qty 20

## 2015-04-30 NOTE — ED Notes (Signed)
Declined W/C at D/C and was escorted to lobby by RN. 

## 2015-04-30 NOTE — ED Provider Notes (Signed)
CSN: [id number]     Arrival date & time 04/30/15  4098 History  By signing my name below, I, Iona Beard, attest that this documentation has been prepared under the direction and in the presence of Vearl Allbaugh C. Phinley Schall, PA-C.  Electronically Signed: Iona Beard, ED Scribe 04/30/2015 at 10:28 AM.    Chief Complaint  Patient presents with  . Abscess     HPI   HPI Comments: Bradley [name] is a 40 y.o. male who presents to the Emergency Department complaining of a gradual onset, constant left buttock abscess, onset a few days ago. Pt reports associated sharp pain when the area is palpated and when he puts pressure on the area. No other worsening or alleviating factors were noted. Pt denies fever, chills, abdominal pain, nausea, diarrhea, or vomiting. He states his abscess started draining this morning prior to arrival.   Past Medical History  Diagnosis Date  . Hypertension   . Asthma     Hx: of as a child  . Sleep apnea   . Headache(784.0)   . Arthritis   . Back pain     Hx: of back injury  . Family history of adverse reaction to anesthesia     Mother- awoke during leg amputation, per mother  . Renal disorder     ESRD   Past Surgical History  Procedure Laterality Date  . Dg av dialysis  shunt access exist*l* or      left- 07/2014  . Appendectomy    . Bascilic vein transposition Right 07/22/2012    Procedure: BASCILIC VEIN TRANSPOSITION;  Surgeon: Chuck Hint, MD;  Location: [facility] OR;  Service: Vascular;  Laterality: Right;  . Avf inserted Left     upper  . Avf   Left     Undone  . Bascilic vein transposition Left 09/05/2014    Procedure: BASCILIC VEIN TRANSPOSITION-LEFT;  Surgeon: Sherren Kerns, MD;  Location: [facility] OR;  Service: Vascular;  Laterality: Left;  . Thrombectomy w/ embolectomy Left 09/20/2014    Procedure: ATTEMPTED THROMBECTOMY LEFT BASILIC VEIN TRANSPOSITION ;  Surgeon: Pryor Ochoa, MD;  Location: [facility] OR;  Service: Vascular;  Laterality: Left;  .  Ligation of arteriovenous  fistula Left 09/20/2014    Procedure: LIGATION OF ARTERIOVENOUS  FISTULA, LEFT UPPER ARM;  Surgeon: Pryor Ochoa, MD;  Location: [facility] OR;  Service: Vascular;  Laterality: Left;  . Av fistula placement Left 09/20/2014    Procedure: INSERTION OF 4-7MM X 45CM STRETCH ARTERIOVENOUS (AV) GORE-TEX GRAFT, LEFT FOREARM, BRACHIAL ARTERY TO BRACHIAL VEIN;  Surgeon: Pryor Ochoa, MD;  Location: [facility] OR;  Service: Vascular;  Laterality: Left;   Family History  Problem Relation Age of Onset  . Hypertension Mother   . Peripheral vascular disease Mother   . Diabetes Father   . Hyperlipidemia Father   . Hypertension Father   . Peripheral vascular disease Maternal Grandmother    Social History  Substance Use Topics  . Smoking status: Current Some Day Smoker -- 0.50 packs/day for 15 years    Types: Cigarettes  . Smokeless tobacco: Never Used  . Alcohol Use: 0.6 oz/week    1 Cans of beer per week      Review of Systems  Constitutional: Negative for fever and chills.  Gastrointestinal: Negative for vomiting, abdominal pain and diarrhea.  Skin:       Abscess to left buttock.       Allergies  Penicillins and Wellbutrin  Home Medications  Prior to Admission medications   Medication Sig Start Date End Date Taking? Authorizing Provider  amLODipine (NORVASC) 10 MG tablet Take 10 mg by mouth daily. 08/05/14   Historical Provider, MD  aspirin EC 325 MG tablet Take 325 mg by mouth daily.    Historical Provider, MD  calcium acetate (PHOSLO) 667 MG capsule Take 1,334 mg by mouth 5 (five) times daily. With meals and with snacks 09/15/14   Historical Provider, MD  cinacalcet (SENSIPAR) 90 MG tablet Take 90 mg by mouth daily.     Historical Provider, MD  folic acid (FOLVITE) 1 MG tablet Take 1 mg by mouth daily.    Historical Provider, MD  loperamide (IMODIUM) 2 MG capsule Take 2 capsules (4 mg total) by mouth 4 (four) times daily as needed for diarrhea or loose stools. 04/23/15    Linna Hoff, MD  multivitamin (RENA-VIT) TABS tablet Take 1 tablet by mouth daily.      Historical Provider, MD  oxyCODONE (OXY IR/ROXICODONE) 5 MG immediate release tablet Take 1 tablet (5 mg total) by mouth every 6 (six) hours as needed for severe pain. 10/03/14   Lenell Antu, MD  Probiotic Product (ALIGN) 4 MG CAPS Take 1 capsule (4 mg total) by mouth 2 (two) times daily. 04/23/15   Linna Hoff, MD  sucroferric oxyhydroxide (VELPHORO) 500 MG chewable tablet Chew 500-1,000 mg by mouth See admin instructions. Take 2 tablets (1000 mg) with meals and 1 tablet (500 mg) with snacks    Historical Provider, MD  sulfamethoxazole-trimethoprim (BACTRIM DS,SEPTRA DS) 800-160 MG tablet Take 1 tablet by mouth 2 (two) times daily. 04/30/15 05/07/15  Mady Gemma, PA-C  warfarin (COUMADIN) 5 MG tablet Take 5-10 mg by mouth See admin instructions. To start after procedure: take 2 tablets (10 mg) by mouth  daily for 3 days, then take 1 tablet (5 mg) daily 09/15/14   Historical Provider, MD    BP 121/73 mmHg  Pulse 102  Temp(Src) 98 F (36.7 C) (Oral)  Resp 16  Wt 346 lb (156.945 kg)  SpO2 97% Physical Exam  Constitutional: He is oriented to person, place, and time. He appears well-developed and well-nourished. No distress.  HENT:  Head: Normocephalic and atraumatic.  Right Ear: External ear normal.  Left Ear: External ear normal.  Nose: Nose normal.  Eyes: Conjunctivae and EOM are normal. Right eye exhibits no discharge. Left eye exhibits no discharge. No scleral icterus.  Neck: Normal range of motion. Neck supple.  Cardiovascular: Normal rate and regular rhythm.   Pulmonary/Chest: Effort normal and breath sounds normal. No respiratory distress.  Musculoskeletal: Normal range of motion. He exhibits no edema or tenderness.  Neurological: He is alert and oriented to person, place, and time.  Skin: Skin is warm and dry. He is not diaphoretic.  2 distinct 1 cm areas of fluctuance to left buttock  with surrounding induration and purulent drainage.  Psychiatric: He has a normal mood and affect. His behavior is normal.  Nursing note and vitals reviewed.   ED Course  Procedures   DIAGNOSTIC STUDIES: Oxygen Saturation is 97% on RA, normal by my interpretation.    COORDINATION OF CARE: 9:28 AM-Discussed treatment plan which includes incision and drainage with pt at bedside and pt agreed to plan.   INCISION AND DRAINAGE PROCEDURE NOTE: Patient identification was confirmed and verbal consent was obtained. This procedure was performed by Lanora Manis C. Lawrance Wiedemann, PA-C at 10:15 AM. Site: Left Buttock Sterile procedures observed Needle size: 25 Anesthetic  used (type and amt): litocaine 1% without epi Blade size: 11 Drainage: Moderate, complex, purulent drainage Complexity: Complex Packing used: No Site anesthetized, incision made over site, wound drained and explored loculations, rinsed with copious amounts of normal saline, wound packed with sterile gauze, covered with dry, sterile dressing.  Pt tolerated procedure well without complications.  Instructions for care discussed verbally and pt provided with additional written instructions for homecare and f/u.  Labs Review Labs Reviewed - No data to display  Imaging Review No results found.     EKG Interpretation None      MDM   Final diagnoses:  Abscess    40 year old male presents with left buttock abscess. Reports drainage. Denies fever, chills. Patient is afebrile. Vital signs stable. On exam, patient has 2 distinct 1 cm areas of fluctuance to his left buttock with surrounding induration and purulent drainage. Incision and drainage performed, which the patient tolerated well. Will discharge with bactrim. Patient to follow-up for wound check in 2 days. Return precautions discussed. Patient verbalizes his understanding and is in agreement with plan.  BP 134/84 mmHg  Pulse 93  Temp(Src) 98 F (36.7 C) (Oral)  Resp 16   Wt 156.945 kg  SpO2 98%  I personally performed the services described in this documentation, which was scribed in my presence. The recorded information has been reviewed and is accurate.       Mady Gemma, PA-C 04/30/15 1627  Raeford Razor, MD 05/01/15 [phone]

## 2015-04-30 NOTE — Discharge Instructions (Signed)
1. Medications: bactrim, usual home medications 2. Treatment: rest, drink plenty of fluids, use warm compresses to encourage continued drainage 3. Follow Up: please followup with your primary doctor in 2 days for wound recheck and for discussion of your diagnoses and further evaluation after today's visit; if you do not have a primary care doctor use the resource guide provided to find one; please return to the ER for increased pain, redness, swelling, fever, new or worsening symptoms   Incision and Drainage Incision and drainage is a procedure in which a sac-like structure (cystic structure) is opened and drained. The area to be drained usually contains material such as pus, fluid, or blood.  LET YOUR CAREGIVER KNOW ABOUT:   Allergies to medicine.  Medicines taken, including vitamins, herbs, eyedrops, over-the-counter medicines, and creams.  Use of steroids (by mouth or creams).  Previous problems with anesthetics or numbing medicines.  History of bleeding problems or blood clots.  Previous surgery.  Other health problems, including diabetes and kidney problems.  Possibility of pregnancy, if this applies. RISKS AND COMPLICATIONS  Pain.  Bleeding.  Scarring.  Infection. BEFORE THE PROCEDURE  You may need to have an ultrasound or other imaging tests to see how large or deep your cystic structure is. Blood tests may also be used to determine if you have an infection or how severe the infection is. You may need to have a tetanus shot. PROCEDURE  The affected area is cleaned with a cleaning fluid. The cyst area will then be numbed with a medicine (local anesthetic). A small incision will be made in the cystic structure. A syringe or catheter may be used to drain the contents of the cystic structure, or the contents may be squeezed out. The area will then be flushed with a cleansing solution. After cleansing the area, it is often gently packed with a gauze or another wound dressing.  Once it is packed, it will be covered with gauze and tape or some other type of wound dressing. AFTER THE PROCEDURE   Often, you will be allowed to go home right after the procedure.  You may be given antibiotic medicine to prevent or heal an infection.  If the area was packed with gauze or some other wound dressing, you will likely need to come back in 1 to 2 days to get it removed.  The area should heal in about 14 days.   This information is not intended to replace advice given to you by your health care provider. Make sure you discuss any questions you have with your health care provider.   Document Released: 09/03/2000 Document Revised: 09/09/2011 Document Reviewed: 05/05/2011 Elsevier Interactive Patient Education [employer]   Emergency Department Resource Guide 1) Find a Doctor and Pay Out of Pocket Although you won't have to find out who is covered by your insurance plan, it is a good idea to ask around and get recommendations. You will then need to call the office and see if the doctor you have chosen will accept you as a new patient and what types of options they offer for patients who are self-pay. Some doctors offer discounts or will set up payment plans for their patients who do not have insurance, but you will need to ask so you aren't surprised when you get to your appointment.  2) Contact Your Local Health Department Not all health departments have doctors that can see patients for sick visits, but many do, so it is worth a call to  see if yours does. If you don't know where your local health department is, you can check in your phone book. The CDC also has a tool to help you locate your state's health department, and many state websites also have listings of all of their local health departments.  3) Find a Walk-in Clinic If your illness is not likely to be very severe or complicated, you may want to try a walk in clinic. These are popping up all over the country in  pharmacies, drugstores, and shopping centers. They're usually staffed by nurse practitioners or physician assistants that have been trained to treat common illnesses and complaints. They're usually fairly quick and inexpensive. However, if you have serious medical issues or chronic medical problems, these are probably not your best option.  No Primary Care Doctor: - Call Health Connect at  [phone] - they can help you locate a primary care doctor that  accepts your insurance, provides certain services, etc. - Physician Referral Service- [phone]  Chronic Pain Problems: Organization         Address  Phone   Notes  [facility]  [phone] Patients need to be referred by their primary care doctor.   Medication Assistance: Organization         Address  Phone   Notes  [facility] [address] [place], [place] [address] [phone] --Must be a resident of [facility] -- Must have NO insurance coverage whatsoever (no Medicaid/ Medicare, etc.) -- The pt. MUST have a primary care doctor that directs their care regularly and follows them in the community   [facility]  [phone]   [employer]  [phone]    Agencies that provide inexpensive medical care: Organization         Address  Phone   Notes  [facility]  [phone]   [facility]    [phone]   [facility] [address] [place], [place] [address] [phone]   [facility] [place] [address], [place] [phone]   Planned Parenthood    [phone]   [facility]    [phone]   [facility] and [facility]  [address], [place] Phone:  [phone], Fax:  [phone] Hours of Operation:  9 am - 6 pm, M-F.  Also accepts Medicaid/Medicare and self-pay.  [facility]  [address],  [place] Phone: [phone], Fax: [phone]. Hours of Operation:  8:30 am - 5:30 pm, M-F.  Also accepts Medicaid and self-pay.  [facility] [place] [address] [place] Phone: [phone]   [facility] [address] Natasha Bence [place], [place] [phone], Ext. [phone] Mondays & Thursdays: 7-9 AM.  First 15 patients are seen on a first come, first serve basis.    Medicaid-accepting [facility] Providers:  Organization         Address  Phone   Notes  [facility] [address], [place] [phone] Also accepts self-pay patients.  [facility] [address] Laurell Josephs [place], [place]  [phone]   [facility] [address], [place] [phone]   [facility] [address], [place] [phone]   Renaye Rakers [address], [place]   [phone]  [phone] Only accepts [place] Access Medicaid patients after they have their name applied to their card.   Self-Pay (no insurance) in [facility]:  Organization         Address  Phone   Notes  Sickle Cell Patients, [facility] [address] [place], [place] [phone]   [facility] [address] [place], [place] [phone]   [facility]  [address], [place] [phone]   [facility]/Dr. Osei-Bonsu  [address] [place] or [address], [place] [phone] Phone number for both [place] and [place] locations is the same.  [facility] [address] [place] [phone]   [facility] [address], [place] or [address] [phone]   [facility] [address] [place] [phone], phone; [phone], fax Sees patients 1st and 3rd Saturday of every month.  Must not  qualify for public or private insurance (i.e. Medicaid, Medicare, [place] Health Choice, Veterans' Benefits)  Household income should be no more than 200% of the poverty level The clinic cannot treat you if you are pregnant or think you are pregnant  Sexually transmitted diseases are not treated at the clinic.    Dental Care: Organization         Address  Phone  Notes  [facility] [address] [place], [place] [phone] Accepts children up to age 34 who are enrolled in [place] or [place] Health Choice; pregnant women with a Medicaid card; and children who have applied for Medicaid or [place] Health Choice, but were declined, whose parents can pay a reduced fee at time of service.  [facility] of [facility]  [address], [place] [phone] Accepts children up to age 48 who are enrolled in [place] or [place] Health Choice; pregnant women with a Medicaid card; and children who have applied for Medicaid or [place] Health Choice, but were declined, whose parents can pay a reduced fee at time of service.  [facility]  [address] [place], [place] [phone] Patients are seen by appointment only. Walk-ins are not accepted. [facility] will see patients 35 years of age and older. Monday - Tuesday (8am-5pm) Most Wednesdays (8:30-5pm) $30 per visit, cash only  [facility] Adult Dental Access PROGRAM  [address], [facility] [phone] Patients are seen by appointment only. Walk-ins are not accepted. [facility] will see patients 57 years of age and older. One Wednesday Evening (Monthly: Volunteer Based).  $30 per visit, cash only  [employer]  [phone] for adults; Children under age 40, call Graduate Pediatric Dentistry at [phone]. Children aged 37-14, please call [phone] to request a pediatric application.  Dental services are provided  in all areas of dental care including fillings, crowns and bridges, complete and partial dentures, implants, gum treatment, root canals, and extractions. Preventive care is also provided. Treatment is provided to both adults and children. Patients are selected via a lottery and there is often a waiting list.   [facility] [address], [place]  [phone] [email or web address]   [facility] [address] [place], [place] [phone], Ext. [phone] Second and Fourth Thursday of each month, opens at 6:30  AM; Clinic ends at 9 AM.  Patients are seen on a first-come first-served basis, and a limited number are seen during each clinic.   [facility]  [address] Ether Griffins [place], [place] [phone]   Eligibility Requirements You must have lived in [place], [place], or [place] counties for at least the last three months.   You cannot be eligible for state or federal sponsored [place], including [employer], [place], or [employer].   You generally cannot be eligible for healthcare insurance through your employer.    How to apply: Eligibility screenings are held every Tuesday and Wednesday afternoon from 1:00 pm until 4:00 pm. You do not need an appointment for the interview!  [facility] [address] [place], [place] [phone]   [facility] Health Department  [phone]   [facility] Health Department  [phone]   [facility] Health Department  [phone]    Behavioral Health Resources in the Community: Intensive Outpatient Programs Organization         Address  Phone  Notes  [facility] [address] [place], [place] [phone]   [facility] [address], [place], [place] [phone]   [facility] [address], [place], [place]  [phone]   [facility] Mental Health [address]  [place], [place]  [phone] or [phone]   Substance Abuse Resources Organization         Address  Phone  Notes  [facility]  ([phone]) [phone]   [facility]  [phone]   [facility]  ([phone]   Floydene Flock  ([phone]   Residential & Outpatient Substance Abuse Program  [phone]   Psychological Services Organization         Address  Phone  Notes  [facility] Behavioral Health  [phone]   [facility]  [phone]   [facility] Mental Health [address] [place] [phone] or ([phone]    Mobile Crisis Teams Organization         Address  Phone  Notes  [facility], Mobile Crisis Care Unit  [phone]   [facility]  [address] [place], [place] [phone]   Doristine Locks [address] [place] [phone]    Self-Help/Support Groups Organization         Address  Phone             Notes  Mental Health Assoc. of  - variety of support groups  336- I7437963 Call for more information  Narcotics Anonymous (NA), [facility] [address], [employer] [place]  2 meetings at this location   Statistician         Address  Phone  Notes  [facility] [address],    [place]  [phone]   [facility]  [address] [place] [address], [place], [place] [phone]   [facility] [address] [place], [place] [phone] Admissions: 8am-3pm M-F  [facility] [address]    [place], [place] [phone]   [facility] [address] Starling Manns [place], [place] [phone]   [facility] [place] [facility] [place] [address]  [place], [place] [phone]   [facility] - Intensive Outpatient [address] Laurell Josephs [address], [place], [place] [phone]   [facility] ([facility] [address] [place], [place] [phone] or  [phone]  [facility] ([facility]) [address] [place], [place] [phone] Accepts Medicaid  °[facility] [address]  °[place] 1-800-659-3381 Substance Abuse/Addiction Treatment  ° °[place] Behavioral Health Resources °Organization         Address  Phone  Notes  °[facility]  (888) [phone]   °Julie Brannon, PhD [address] [employer], [place]   ([phone]) [phone] or (336) 951-0000   °[facility]   [address] °[place], [place] ([phone]) 349-4454   °[facility] [address], [place], [place] (336) [phone] Insurance/Medicaid/sponsorship through [facility]  °[facility] [address]                                    [place], [place] ([phone]) [phone] Therapy/tele-psych/case  °[facility] [address]  ° [place], [place] ([phone]) [phone]    °Dr. Arfeen  ([phone]) [phone]   °[facility] [place]  [employer] [place] [facility] 1) [address], [place] °2) [address], [place] °3)  [address], [place] (336) [phone] °(336) 342-7768 ° °(336) [phone]   °[place] Child Abuse Hotline (336) 342-1394 or (336) 342-3537 (After Hours)    ° ° ° °

## 2015-04-30 NOTE — ED Notes (Signed)
Pt reports an abscess to his buttocks that he noticed a couple of days ago. States he has hx of he same in the past. No drainage.

## 2015-05-22 ENCOUNTER — Encounter (HOSPITAL_COMMUNITY): Payer: Self-pay | Admitting: Emergency Medicine

## 2015-05-22 ENCOUNTER — Emergency Department (HOSPITAL_COMMUNITY)
Admission: EM | Admit: 2015-05-22 | Discharge: 2015-05-22 | Disposition: A | Discharge status: Home / Self Care | Payer: Medicare Other | Attending: Emergency Medicine | Admitting: Emergency Medicine

## 2015-05-22 DIAGNOSIS — I120 Hypertensive chronic kidney disease with stage 5 chronic kidney disease or end stage renal disease: Secondary | ICD-10-CM | POA: Insufficient documentation

## 2015-05-22 DIAGNOSIS — Z880 Allergy status to penicillin: Secondary | ICD-10-CM | POA: Insufficient documentation

## 2015-05-22 DIAGNOSIS — J45909 Unspecified asthma, uncomplicated: Secondary | ICD-10-CM | POA: Diagnosis not present

## 2015-05-22 DIAGNOSIS — Z79899 Other long term (current) drug therapy: Secondary | ICD-10-CM | POA: Diagnosis not present

## 2015-05-22 DIAGNOSIS — N186 End stage renal disease: Secondary | ICD-10-CM | POA: Diagnosis not present

## 2015-05-22 DIAGNOSIS — Z7982 Long term (current) use of aspirin: Secondary | ICD-10-CM | POA: Diagnosis not present

## 2015-05-22 DIAGNOSIS — Z992 Dependence on renal dialysis: Secondary | ICD-10-CM | POA: Diagnosis not present

## 2015-05-22 DIAGNOSIS — Z7901 Long term (current) use of anticoagulants: Secondary | ICD-10-CM | POA: Diagnosis not present

## 2015-05-22 DIAGNOSIS — M7981 Nontraumatic hematoma of soft tissue: Secondary | ICD-10-CM | POA: Insufficient documentation

## 2015-05-22 DIAGNOSIS — S40022A Contusion of left upper arm, initial encounter: Secondary | ICD-10-CM

## 2015-05-22 DIAGNOSIS — Z8669 Personal history of other diseases of the nervous system and sense organs: Secondary | ICD-10-CM | POA: Insufficient documentation

## 2015-05-22 DIAGNOSIS — M7989 Other specified soft tissue disorders: Secondary | ICD-10-CM | POA: Diagnosis present

## 2015-05-22 DIAGNOSIS — F17210 Nicotine dependence, cigarettes, uncomplicated: Secondary | ICD-10-CM | POA: Insufficient documentation

## 2015-05-22 DIAGNOSIS — M1990 Unspecified osteoarthritis, unspecified site: Secondary | ICD-10-CM | POA: Insufficient documentation

## 2015-05-22 LAB — CBC WITH DIFFERENTIAL/PLATELET
Basophils Absolute: 0 10*3/uL (ref 0.0–0.1)
Basophils Relative: 0 %
Eosinophils Absolute: 0.2 10*3/uL (ref 0.0–0.7)
Eosinophils Relative: 3 %
HCT: 47.9 % (ref 39.0–52.0)
Hemoglobin: 16 g/dL (ref 13.0–17.0)
Lymphocytes Relative: 18 %
Lymphs Abs: 1.2 10*3/uL (ref 0.7–4.0)
MCH: 28.5 pg (ref 26.0–34.0)
MCHC: 33.4 g/dL (ref 30.0–36.0)
MCV: 85.2 fL (ref 78.0–100.0)
Monocytes Absolute: 0.7 10*3/uL (ref 0.1–1.0)
Monocytes Relative: 11 %
Neutro Abs: 4.6 10*3/uL (ref 1.7–7.7)
Neutrophils Relative %: 68 %
Platelets: 227 10*3/uL (ref 150–400)
RBC: 5.62 MIL/uL (ref 4.22–5.81)
RDW: 16.1 % — ABNORMAL HIGH (ref 11.5–15.5)
WBC: 6.7 10*3/uL (ref 4.0–10.5)

## 2015-05-22 LAB — PROTIME-INR
INR: 2.47 — ABNORMAL HIGH (ref 0.00–1.49)
Prothrombin Time: 26.5 seconds — ABNORMAL HIGH (ref 11.6–15.2)

## 2015-05-22 LAB — BASIC METABOLIC PANEL
ANION GAP: 14 (ref 5–15)
BUN: 20 mg/dL (ref 6–20)
CO2: 28 mmol/L (ref 22–32)
CREATININE: 8.41 mg/dL — AB (ref 0.61–1.24)
Calcium: 9.1 mg/dL (ref 8.9–10.3)
Chloride: 95 mmol/L — ABNORMAL LOW (ref 101–111)
GFR calc Af Amer: 8 mL/min — ABNORMAL LOW (ref >60)
GFR, EST NON AFRICAN AMERICAN: 7 mL/min — AB (ref >60)
Glucose, Bld: 93 mg/dL (ref 65–99)
POTASSIUM: 4.7 mmol/L (ref 3.5–5.1)
SODIUM: 137 mmol/L (ref 135–145)

## 2015-05-22 MED ORDER — LIDOCAINE-EPINEPHRINE 1 %-1:100000 IJ SOLN
30.0000 mL | Freq: Once | INTRAMUSCULAR | Status: AC
  Administered 2015-05-22: 30 mL via INTRADERMAL
  Filled 2015-05-22: qty 1

## 2015-05-22 MED ORDER — SULFAMETHOXAZOLE-TRIMETHOPRIM 800-160 MG PO TABS: 1.0000 | ORAL_TABLET | Freq: Two times a day (BID) | ORAL | Status: AC

## 2015-05-22 NOTE — ED Notes (Signed)
Pt reports localized swelling to LUE above elbow, redness noted to area.  Pt denies injury.  Pt reports swelling worse yesterday.  Pt takes coumadin, dialysis.  Tachycardic upon arrival, oral temp 99.0.

## 2015-05-22 NOTE — ED Provider Notes (Addendum)
CSN: [id number]     Arrival date & time 05/22/15  4098 History   First MD Initiated Contact with Patient 05/22/15 [phone]     Chief Complaint  Patient presents with  . 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 States that several nights ago during his sleep he felt that he may have had a bug bite and started scratching his left upper arm. Over the course of the past several days he has noted some increased bruising and swelling to a focal area above his left elbow. Has not had any fevers, chills, night sweats, nausea or vomiting, difficulty breathing, chest pain. States that he had routine dialysis yesterday evening, which she is normally dialyzed very dry. States that he otherwise has been in his usual state of health. He is unsure if he had trauma to the area but denies any falls. He has had abscesses in the past, and states that this presented very differently.  Past Medical History  Diagnosis Date  . Hypertension   . Asthma     Hx: of as a child  . Sleep apnea   . Headache(784.0)   . Arthritis   . Back pain     Hx: of back injury  . Family history of adverse reaction to anesthesia     Mother- awoke during leg amputation, per mother  . Renal disorder     ESRD   Past Surgical History  Procedure Laterality Date  . Dg av dialysis  shunt access exist*l* or      left- 07/2014  . Appendectomy    . Bascilic vein transposition Right 07/22/2012    Procedure: BASCILIC VEIN TRANSPOSITION;  Surgeon: Chuck Hint, MD;  Location: [facility] OR;  Service: Vascular;  Laterality: Right;  . Avf inserted Left     upper  . Avf   Left     Undone  . Bascilic vein transposition Left 09/05/2014    Procedure: BASCILIC VEIN  TRANSPOSITION-LEFT;  Surgeon: Sherren Kerns, MD;  Location: [facility] OR;  Service: Vascular;  Laterality: Left;  . Thrombectomy w/ embolectomy Left 09/20/2014    Procedure: ATTEMPTED THROMBECTOMY LEFT BASILIC VEIN TRANSPOSITION ;  Surgeon: Pryor Ochoa, MD;  Location: [facility] OR;  Service: Vascular;  Laterality: Left;  . Ligation of arteriovenous  fistula Left 09/20/2014    Procedure: LIGATION OF ARTERIOVENOUS  FISTULA, LEFT UPPER ARM;  Surgeon: Pryor Ochoa, MD;  Location: [facility] OR;  Service: Vascular;  Laterality: Left;  . Av fistula placement Left 09/20/2014    Procedure: INSERTION OF 4-7MM X 45CM STRETCH ARTERIOVENOUS (AV) GORE-TEX GRAFT, LEFT FOREARM, BRACHIAL ARTERY TO BRACHIAL VEIN;  Surgeon: Pryor Ochoa, MD;  Location: [facility] OR;  Service: Vascular;  Laterality: Left;   Family History  Problem Relation Age of Onset  . Hypertension Mother   . Peripheral vascular disease Mother   . Diabetes Father   . Hyperlipidemia Father   . Hypertension Father   . Peripheral vascular disease Maternal Grandmother    Social History  Substance Use Topics  . Smoking status: Current Some Day Smoker -- 0.50 packs/day for 15 years    Types: Cigarettes  . Smokeless tobacco: Never Used  . Alcohol Use: 0.6 oz/week    1 Cans of beer per week    Review  of Systems  10/14 systems reviewed and are negative other than those stated in the HPI    Allergies  Penicillins and Wellbutrin  Home Medications   Prior to Admission medications   Medication Sig Start Date End Date Taking? Authorizing Provider  amLODipine (NORVASC) 10 MG tablet Take 10 mg by mouth daily. 08/05/14  Yes Historical Provider, MD  calcium acetate (PHOSLO) 667 MG capsule Take 1,334 mg by mouth 5 (five) times daily. With meals and with snacks 09/15/14  Yes Historical Provider, MD  cinacalcet (SENSIPAR) 90 MG tablet Take 90 mg by mouth 2 (two) times daily.    Yes Historical Provider, MD  folic acid (FOLVITE) 1 MG tablet Take 1 mg by mouth daily.    Yes Historical Provider, MD  loperamide (IMODIUM) 2 MG capsule Take 2 capsules (4 mg total) by mouth 4 (four) times daily as needed for diarrhea or loose stools. 04/23/15  Yes Linna Hoff, MD  multivitamin (RENA-VIT) TABS tablet Take 1 tablet by mouth daily.     Yes Historical Provider, MD  oxyCODONE (OXY IR/ROXICODONE) 5 MG immediate release tablet Take 1 tablet (5 mg total) by mouth every 6 (six) hours as needed for severe pain. 10/03/14  Yes Lenell Antu, MD  Oxycodone HCl 10 MG TABS Take 10 mg by mouth 3 (three) times daily. 05/16/15  Yes Historical Provider, MD  Probiotic Product (ALIGN) 4 MG CAPS Take 1 capsule (4 mg total) by mouth 2 (two) times daily. 04/23/15  Yes Linna Hoff, MD  sucroferric oxyhydroxide (VELPHORO) 500 MG chewable tablet Chew 500-1,000 mg by mouth See admin instructions. Take 2 tablets (1000 mg) with meals and 1 tablet (500 mg) with snacks   Yes Historical Provider, MD  warfarin (COUMADIN) 7.5 MG tablet Take 7.5 mg by mouth daily.   Yes Historical Provider, MD  aspirin EC 325 MG tablet Take 325 mg by mouth daily.    Historical Provider, MD  sulfamethoxazole-trimethoprim (BACTRIM DS,SEPTRA DS) 800-160 MG tablet Take 1 tablet by mouth 2 (two) times daily. 05/22/15 05/29/15  Lavera Guise, MD  warfarin (COUMADIN) 5 MG tablet Take 5-10 mg by mouth See admin instructions. To start after procedure: take 2 tablets (10 mg) by mouth  daily for 3 days, then take 1 tablet (5 mg) daily 09/15/14   Historical Provider, MD   BP 134/109 mmHg  Pulse 113  Temp(Src) 99 F (37.2 C) (Oral)  Resp 18  Ht 6' (1.829 m)  Wt 330 lb 11 oz (150 kg)  BMI 44.84 kg/m2  SpO2 95% Physical Exam Physical Exam  Nursing note and vitals reviewed. Constitutional: Well developed, well nourished, non-toxic, and in no acute distress Head: Normocephalic and atraumatic.  Mouth/Throat: Oropharynx is clear and moist.  Neck: Normal range of motion. Neck supple.  Cardiovascular: Normal rate and regular rhythm.  +2  radial pulses Pulmonary/Chest: Effort normal and breath sounds normal.  Abdominal: Soft. There is no tenderness. There is no rebound and no guarding.  Musculoskeletal: There is 2 x 2 area of ecchymosis, mild swelling, and mild erythema over the left upper arm just superior to elbow over the triceps. No induration, drainage. Neurological: Alert, no facial droop, fluent speech, moves all extremities symmetrically Skin: Skin is warm and dry.  Psychiatric: Cooperative  ED Course  Aspiration of blood/fluid Date/Time: 05/28/2015 2:17 PM Performed by: Crista Curb DUO Authorized by: Crista Curb DUO Consent: Verbal consent obtained. Risks and benefits: risks, benefits and alternatives were discussed Consent given by: patient Patient  understanding: patient states understanding of the procedure being performed Required items: required blood products, implants, devices, and special equipment available Patient identity confirmed: verbally with patient Time out: Immediately prior to procedure a "time out" was called to verify the correct patient, procedure, equipment, support staff and site/side marked as required. Preparation: Patient was prepped and draped in the usual sterile fashion. Local anesthesia used: yes Anesthesia: local infiltration Local anesthetic: lidocaine 1% with epinephrine Anesthetic total: 4 ml Patient sedated: no Patient tolerance: Patient tolerated the procedure well with no immediate complications   (including critical care time) Labs Review Labs Reviewed  PROTIME-INR - Abnormal; Notable for the following:    Prothrombin Time 26.5 (*)    INR 2.47 (*)    All other components within normal limits  CBC WITH DIFFERENTIAL/PLATELET - Abnormal; Notable for the following:    RDW 16.1 (*)    All other components within normal limits  BASIC METABOLIC PANEL - Abnormal; Notable for the following:    Chloride 95 (*)    Creatinine, Ser 8.41 (*)    GFR calc non Af Amer 7 (*)    GFR calc  Af Amer 8 (*)    All other components within normal limits    Imaging Review No results found. I have personally reviewed and evaluated these images and lab results as part of my medical decision-making.   EKG Interpretation None      MDM   Final diagnoses:  Hematoma of arm, left, initial encounter    40 year old male who presents with focal swelling over his left triceps over the course of the past few days. He is afebrile, well-appearing in no acute distress on presentation. Noted to be tachycardic in triage with a heart rate of 113, however on the monitor in his room his heart rate is in the 90s. Also states that he was just dialyzed last night, and states that typically a large amounts of fluid get dialyzed from him and his mild tachycardia may be a result. There is an area of 2 x 2 centimeters of ecchymosis and mild soft tissue swelling overlying the left triceps. No significant fluctuance noted, and on bedside ultrasound there is noted to be a 1 x 1 cm fluid collection. This was aspirated and blood was obtained, and overall this is consistent with a hematoma. Given some overlying redness, he is given a course of Bactrim for treatment of potential cellulitis. Blood work overall is unremarkable. INR therapeutic. I do not suspect any systemic symptoms. I discussed strict return and follow-up instructions. He expressed understanding of all discharge instructions and felt comfortable with the plan of care.  Lavera Guise, MD 05/22/15 1222  Lavera Guise, MD 05/28/15 [phone]

## 2015-05-22 NOTE — ED Notes (Signed)
Dr. Liu MD at bedside. 

## 2015-05-22 NOTE — Discharge Instructions (Signed)
Please return for worsening symptoms, including increased redness/swelling/pain, fever, or any other symptoms concerning to you. Please keep your arm in a compression dressing and ice as needed to keep swelling down. Take anbitiotics as prescribed. Follow-up closely with PCP

## 2015-08-29 ENCOUNTER — Telehealth: Payer: Self-pay

## 2015-08-29 NOTE — Telephone Encounter (Signed)
Questioned Dr. Imogene Burnhen if pt. Will need any vascular studies, prior to office appt. With Dr. Edilia Boickson.  Recommended to schedule office visit only at this time, and let Dr. Edilia Boickson decide the next step.  Will contact pt. with appt. For 09/05/15.

## 2015-08-29 NOTE — Telephone Encounter (Signed)
Spoke to pt to sch appt 6/14 at 10.

## 2015-08-29 NOTE — Telephone Encounter (Signed)
rec'd call from Dr. Juel BurrowLin.  Requested to schedule pt. Appt. With Dr. Edilia Boickson next Wednesday, "due to left arm AVG requiring frequent interventions, and is problematic."  Per Dr. Juel BurrowLin, "this gentleman is complicated, and Dr. Edilia Boickson is familiar with him; he may need a new access."  Per Dr. Juel BurrowLin, he will send recent Fistulogram films to our office, prior to pt's appt.  Will discuss with MD in office re: need for vascular studies, then contact the pt., to schedule appt.

## 2015-08-30 ENCOUNTER — Encounter: Payer: Self-pay | Admitting: Vascular Surgery

## 2015-09-05 ENCOUNTER — Encounter: Payer: Self-pay | Admitting: Vascular Surgery

## 2015-09-05 ENCOUNTER — Ambulatory Visit (INDEPENDENT_AMBULATORY_CARE_PROVIDER_SITE_OTHER): Payer: Medicare Other | Admitting: Vascular Surgery

## 2015-09-05 VITALS — BP 122/90 | HR 102 | Ht 72.0 in | Wt 322.7 lb

## 2015-09-05 DIAGNOSIS — N186 End stage renal disease: Secondary | ICD-10-CM | POA: Diagnosis not present

## 2015-09-05 DIAGNOSIS — Z992 Dependence on renal dialysis: Secondary | ICD-10-CM

## 2015-09-05 NOTE — Progress Notes (Signed)
Duplicate note

## 2015-09-05 NOTE — Progress Notes (Signed)
[facility]  Patient name: Bradley [name] MRN: [id number] DOB: Oct 22, 1975 Sex: male  REASON FOR VISIT: Evaluate for new hemodialysis access.  HPI: Bradley [name] is a 40 y.o. male referred for evaluation for new hemodialysis access.  I last saw the patient on 11/08/2014. At that time, I noted that Dr. Hart Rochester attempted a left basilic vein transposition thrombectomy in June of last year. This was not successful and therefore he required placement of a 4-7 mm PTFE graft in the left forearm. He had some dehiscence of the wound in the left arm but this ultimately healed.  Since that time he is had multiple problems with his left forearm graft. He's had multiple procedures and has required stent placement in the antecubital level on the left and also on the upper arm on the left. Once this ultimately failed he had a left IJ catheter placed.  He comes in for evaluation for new access. I have reviewed his history in detail and he is had a forearm and upper arm fistula on both sides including a basilic vein transposition on both sides. Therefore he is clearly not a candidate for a fistula and either arm.   Past Medical History  Diagnosis Date  . Hypertension   . Asthma     Hx: of as a child  . Sleep apnea   . Headache(784.0)   . Arthritis   . Back pain     Hx: of back injury  . Family history of adverse reaction to anesthesia     Mother- awoke during leg amputation, per mother  . Renal disorder     ESRD    Family History  Problem Relation Age of Onset  . Hypertension Mother   . Peripheral vascular disease Mother   . Diabetes Father   . Hyperlipidemia Father   . Hypertension Father   . Peripheral vascular disease Maternal Grandmother     SOCIAL HISTORY: Social History  Substance Use Topics  . Smoking status: Current Some Day Smoker -- 0.50 packs/day for 15 years    Types: Cigarettes  . Smokeless tobacco: Never Used  . Alcohol Use: 0.6 oz/week   1 Cans of beer per week    Allergies  Allergen Reactions  . Penicillins Anaphylaxis  . Wellbutrin [Bupropion] Rash    Current Outpatient Prescriptions  Medication Sig Dispense Refill  . amLODipine (NORVASC) 10 MG tablet Take 10 mg by mouth daily.  0  . aspirin EC 325 MG tablet Take 325 mg by mouth daily.    . calcium acetate (PHOSLO) 667 MG capsule Take 1,334 mg by mouth 5 (five) times daily. With meals and with snacks  0  . cinacalcet (SENSIPAR) 90 MG tablet Take 90 mg by mouth 2 (two) times daily.     . folic acid (FOLVITE) 1 MG tablet Take 1 mg by mouth daily.    Marland Kitchen loperamide (IMODIUM) 2 MG capsule Take 2 capsules (4 mg total) by mouth 4 (four) times daily as needed for diarrhea or loose stools. 20 capsule 1  . multivitamin (RENA-VIT) TABS tablet Take 1 tablet by mouth daily.      Marland Kitchen oxyCODONE (OXY IR/ROXICODONE) 5 MG immediate release tablet Take 1 tablet (5 mg total) by mouth every 6 (six) hours as needed for severe pain. 15 tablet 0  . Oxycodone HCl 10 MG TABS Take 10 mg by mouth 3 (three) times daily.  0  . Probiotic Product (ALIGN) 4 MG CAPS Take 1  capsule (4 mg total) by mouth 2 (two) times daily. 30 capsule 1  . sucroferric oxyhydroxide (VELPHORO) 500 MG chewable tablet Chew 500-1,000 mg by mouth See admin instructions. Take 2 tablets (1000 mg) with meals and 1 tablet (500 mg) with snacks    . warfarin (COUMADIN) 5 MG tablet Take 5-10 mg by mouth See admin instructions. To start after procedure: take 2 tablets (10 mg) by mouth  daily for 3 days, then take 1 tablet (5 mg) daily  0  . warfarin (COUMADIN) 7.5 MG tablet Take 7.5 mg by mouth daily.     No current facility-administered medications for this visit.    REVIEW OF SYSTEMS:  [X]  denotes positive finding, [ ]  denotes negative finding Cardiac  Comments:  Chest pain or chest pressure:    Shortness of breath upon exertion:    Short of breath when lying flat:    Irregular heart rhythm:        Vascular    Pain in calf,  thigh, or hip brought on by ambulation:    Pain in feet at night that wakes you up from your sleep:     Blood clot in your veins:    Leg swelling:         Pulmonary    Oxygen at home:    Productive cough:     Wheezing:         Neurologic    Sudden weakness in arms or legs:     Sudden numbness in arms or legs:     Sudden onset of difficulty speaking or slurred speech:    Temporary loss of vision in one eye:     Problems with dizziness:         Gastrointestinal    Blood in stool:     Vomited blood:         Genitourinary    Burning when urinating:     Blood in urine:        Psychiatric    Major depression:         Hematologic    Bleeding problems:    Problems with blood clotting too easily:        Skin    Rashes or ulcers:        Constitutional    Fever or chills:      PHYSICAL EXAM: There were no vitals filed for this visit.  GENERAL: The patient is a well-nourished male, in no acute distress. The vital signs are documented above. CARDIAC: There is a regular rate and rhythm.  VASCULAR: he has diminished radial pulses bilaterally but reasonable Doppler signals in both wrists. PULMONARY: There is good air exchange bilaterally without wheezing or rales. ABDOMEN: Soft and non-tender with normal pitched bowel sounds.  MUSCULOSKELETAL: There are no major deformities or cyanosis. NEUROLOGIC: No focal weakness or paresthesias are detected. SKIN: There are no ulcers or rashes noted. PSYCHIATRIC: The patient has a normal affect.  DATA:  I have reviewed the study that was done last week. I have also discussed the procedure with Dr. Juel BurrowLin. He states that there was not any central venous stenosis on the left.  MEDICAL ISSUES:  STAGE V CHRONIC KIDNEY DISEASE: In the office today we discussed placing a new graft in the left upper arm. There is no central venous stenosis. However, when he had access in the left arm before that was functioning and a left IJ catheter he had  problems with arm swelling. This reason I think  it might be better to place the new graft in the right arm. We will stop his Coumadin. This was being used to maintain graft patency and any currently does not have a functioning graft. Once his wounds have healed that I think we could restart his Coumadin to help maintain graft patency. This surgery has been scheduled for 09/13/2015 which is a nondialysis day.   Waverly Ferrariickson, [place] [facility] [place] [facility] [phone]

## 2015-09-06 ENCOUNTER — Other Ambulatory Visit: Payer: Self-pay

## 2015-09-07 ENCOUNTER — Ambulatory Visit: Payer: Medicare Other | Admitting: Vascular Surgery

## 2015-09-12 ENCOUNTER — Encounter (HOSPITAL_COMMUNITY): Payer: Self-pay | Admitting: *Deleted

## 2015-09-12 MED ORDER — VANCOMYCIN HCL 10 G IV SOLR
1500.0000 mg | INTRAVENOUS | Status: AC
  Administered 2015-09-13: 1500 mg via INTRAVENOUS
  Filled 2015-09-12: qty 1500

## 2015-09-12 MED ORDER — SODIUM CHLORIDE 0.9 % IV SOLN
INTRAVENOUS | Status: DC
  Administered 2015-09-13 (×2): via INTRAVENOUS

## 2015-09-12 NOTE — Progress Notes (Signed)
Pt denies SOB, chest pain, and being under the care of a cardiologist. Pt denies having a cardiac cath. Pt stated that his last dose of Coumadin was Tuesday as instructed by the MD. Pt verbalized understanding of all pre-op instructions.

## 2015-09-13 ENCOUNTER — Encounter (HOSPITAL_COMMUNITY)
Admission: RE | Disposition: A | Discharge status: Home / Self Care | Payer: Self-pay | Source: Ambulatory Visit | Attending: Vascular Surgery

## 2015-09-13 ENCOUNTER — Ambulatory Visit (HOSPITAL_COMMUNITY): Payer: Medicare Other | Admitting: Certified Registered"

## 2015-09-13 ENCOUNTER — Encounter: Payer: Self-pay | Admitting: Vascular Surgery

## 2015-09-13 ENCOUNTER — Ambulatory Visit (HOSPITAL_COMMUNITY)
Admission: RE | Admit: 2015-09-13 | Discharge: 2015-09-13 | Disposition: A | Discharge status: Home / Self Care | Payer: Medicare Other | Source: Ambulatory Visit | Attending: Vascular Surgery | Admitting: Vascular Surgery

## 2015-09-13 DIAGNOSIS — N186 End stage renal disease: Secondary | ICD-10-CM | POA: Diagnosis not present

## 2015-09-13 DIAGNOSIS — F17210 Nicotine dependence, cigarettes, uncomplicated: Secondary | ICD-10-CM | POA: Diagnosis not present

## 2015-09-13 DIAGNOSIS — I120 Hypertensive chronic kidney disease with stage 5 chronic kidney disease or end stage renal disease: Secondary | ICD-10-CM | POA: Diagnosis present

## 2015-09-13 DIAGNOSIS — Z7901 Long term (current) use of anticoagulants: Secondary | ICD-10-CM | POA: Insufficient documentation

## 2015-09-13 DIAGNOSIS — Z880 Allergy status to penicillin: Secondary | ICD-10-CM | POA: Insufficient documentation

## 2015-09-13 DIAGNOSIS — N185 Chronic kidney disease, stage 5: Secondary | ICD-10-CM

## 2015-09-13 HISTORY — PX: AV FISTULA PLACEMENT: SHX1204

## 2015-09-13 LAB — POCT I-STAT 4, (NA,K, GLUC, HGB,HCT)
Glucose, Bld: 81 mg/dL (ref 65–99)
HEMATOCRIT: 43 % (ref 39.0–52.0)
HEMOGLOBIN: 14.6 g/dL (ref 13.0–17.0)
Potassium: 5.8 mmol/L — ABNORMAL HIGH (ref 3.5–5.1)
Sodium: 136 mmol/L (ref 135–145)

## 2015-09-13 SURGERY — INSERTION OF ARTERIOVENOUS (AV) GORE-TEX GRAFT ARM
Anesthesia: General | Site: Arm Upper | Laterality: Right

## 2015-09-13 MED ORDER — THROMBIN 20000 UNITS EX SOLR
CUTANEOUS | Status: AC
  Filled 2015-09-13: qty 20000

## 2015-09-13 MED ORDER — SODIUM CHLORIDE 0.9 % IV SOLN
INTRAVENOUS | Status: DC | PRN
  Administered 2015-09-13: 500 mL

## 2015-09-13 MED ORDER — PROMETHAZINE HCL 25 MG/ML IJ SOLN: 6.2500 mg | INTRAMUSCULAR | Status: DC | PRN

## 2015-09-13 MED ORDER — MIDAZOLAM HCL 2 MG/2ML IJ SOLN
INTRAMUSCULAR | Status: AC
  Filled 2015-09-13: qty 2

## 2015-09-13 MED ORDER — PROTAMINE SULFATE 10 MG/ML IV SOLN
INTRAVENOUS | Status: AC
  Filled 2015-09-13: qty 5

## 2015-09-13 MED ORDER — HYDROMORPHONE HCL 1 MG/ML IJ SOLN
0.2500 mg | INTRAMUSCULAR | Status: DC | PRN
  Administered 2015-09-13 (×2): 0.5 mg via INTRAVENOUS

## 2015-09-13 MED ORDER — HEPARIN SODIUM (PORCINE) 1000 UNIT/ML IJ SOLN
INTRAMUSCULAR | Status: AC
  Filled 2015-09-13: qty 1

## 2015-09-13 MED ORDER — OXYCODONE HCL 5 MG PO TABS
ORAL_TABLET | ORAL | Status: AC
  Filled 2015-09-13: qty 1

## 2015-09-13 MED ORDER — LIDOCAINE HCL (CARDIAC) 20 MG/ML IV SOLN
INTRAVENOUS | Status: DC | PRN
  Administered 2015-09-13: 100 mg via INTRATRACHEAL

## 2015-09-13 MED ORDER — PROPOFOL 10 MG/ML IV BOLUS
INTRAVENOUS | Status: AC
  Filled 2015-09-13: qty 20

## 2015-09-13 MED ORDER — FENTANYL CITRATE (PF) 250 MCG/5ML IJ SOLN
INTRAMUSCULAR | Status: AC
  Filled 2015-09-13: qty 5

## 2015-09-13 MED ORDER — ONDANSETRON HCL 4 MG/2ML IJ SOLN
INTRAMUSCULAR | Status: AC
  Filled 2015-09-13: qty 2

## 2015-09-13 MED ORDER — SODIUM POLYSTYRENE SULFONATE 15 GM/60ML PO SUSP
45.0000 g | Freq: Once | ORAL | Status: DC
  Filled 2015-09-13: qty 180

## 2015-09-13 MED ORDER — ONDANSETRON HCL 4 MG/2ML IJ SOLN
INTRAMUSCULAR | Status: DC | PRN
  Administered 2015-09-13: 4 mg via INTRAVENOUS

## 2015-09-13 MED ORDER — PROTAMINE SULFATE 10 MG/ML IV SOLN
INTRAVENOUS | Status: DC | PRN
  Administered 2015-09-13: 50 mg via INTRAVENOUS

## 2015-09-13 MED ORDER — PROPOFOL 10 MG/ML IV BOLUS
INTRAVENOUS | Status: DC | PRN
  Administered 2015-09-13: 270 mg via INTRAVENOUS

## 2015-09-13 MED ORDER — OXYCODONE HCL 5 MG PO TABS: 5.0000 mg | ORAL_TABLET | Freq: Four times a day (QID) | ORAL | Status: DC | PRN

## 2015-09-13 MED ORDER — FENTANYL CITRATE (PF) 250 MCG/5ML IJ SOLN
INTRAMUSCULAR | Status: DC | PRN
  Administered 2015-09-13 (×2): 25 ug via INTRAVENOUS
  Administered 2015-09-13 (×2): 50 ug via INTRAVENOUS
  Administered 2015-09-13: 150 ug via INTRAVENOUS
  Administered 2015-09-13 (×2): 25 ug via INTRAVENOUS
  Administered 2015-09-13: 50 ug via INTRAVENOUS

## 2015-09-13 MED ORDER — OXYCODONE HCL 5 MG PO TABS
5.0000 mg | ORAL_TABLET | Freq: Once | ORAL | Status: AC
  Administered 2015-09-13: 5 mg via ORAL

## 2015-09-13 MED ORDER — HEPARIN SODIUM (PORCINE) 1000 UNIT/ML IJ SOLN
INTRAMUSCULAR | Status: DC | PRN
  Administered 2015-09-13: 10000 [IU] via INTRAVENOUS

## 2015-09-13 MED ORDER — MIDAZOLAM HCL 2 MG/2ML IJ SOLN
INTRAMUSCULAR | Status: DC | PRN
  Administered 2015-09-13: 2 mg via INTRAVENOUS

## 2015-09-13 MED ORDER — 0.9 % SODIUM CHLORIDE (POUR BTL) OPTIME
TOPICAL | Status: DC | PRN
  Administered 2015-09-13: 1000 mL

## 2015-09-13 MED ORDER — HYDROMORPHONE HCL 1 MG/ML IJ SOLN
INTRAMUSCULAR | Status: AC
  Administered 2015-09-13: 0.5 mg via INTRAVENOUS
  Filled 2015-09-13: qty 1

## 2015-09-13 SURGICAL SUPPLY — 34 items
ARMBAND PINK RESTRICT EXTREMIT (MISCELLANEOUS) ×2 IMPLANT
CANISTER SUCTION 2500CC (MISCELLANEOUS) ×2 IMPLANT
CANNULA VESSEL 3MM 2 BLNT TIP (CANNULA) ×2 IMPLANT
CLIP TI MEDIUM 6 (CLIP) ×2 IMPLANT
CLIP TI WIDE RED SMALL 6 (CLIP) ×4 IMPLANT
DECANTER SPIKE VIAL GLASS SM (MISCELLANEOUS) ×2 IMPLANT
ELECT REM PT RETURN 9FT ADLT (ELECTROSURGICAL) ×2
ELECTRODE REM PT RTRN 9FT ADLT (ELECTROSURGICAL) ×1 IMPLANT
GLOVE BIO SURGEON STRL SZ 6.5 (GLOVE) ×2 IMPLANT
GLOVE BIO SURGEON STRL SZ7.5 (GLOVE) ×2 IMPLANT
GLOVE BIOGEL PI IND STRL 6.5 (GLOVE) ×2 IMPLANT
GLOVE BIOGEL PI IND STRL 7.0 (GLOVE) ×3 IMPLANT
GLOVE BIOGEL PI IND STRL 8 (GLOVE) ×1 IMPLANT
GLOVE BIOGEL PI INDICATOR 6.5 (GLOVE) ×2
GLOVE BIOGEL PI INDICATOR 7.0 (GLOVE) ×3
GLOVE BIOGEL PI INDICATOR 8 (GLOVE) ×1
GLOVE ECLIPSE 6.5 STRL STRAW (GLOVE) ×2 IMPLANT
GOWN STRL REUS W/ TWL LRG LVL3 (GOWN DISPOSABLE) ×4 IMPLANT
GOWN STRL REUS W/TWL LRG LVL3 (GOWN DISPOSABLE) ×4
GRAFT VASC ACUSEAL 4-7X45 (Vascular Products) ×2 IMPLANT
KIT BASIN OR (CUSTOM PROCEDURE TRAY) ×2 IMPLANT
KIT ROOM TURNOVER OR (KITS) ×2 IMPLANT
LIQUID BAND (GAUZE/BANDAGES/DRESSINGS) ×4 IMPLANT
NS IRRIG 1000ML POUR BTL (IV SOLUTION) ×2 IMPLANT
PACK CV ACCESS (CUSTOM PROCEDURE TRAY) ×2 IMPLANT
PAD ARMBOARD 7.5X6 YLW CONV (MISCELLANEOUS) ×4 IMPLANT
SPONGE SURGIFOAM ABS GEL 100 (HEMOSTASIS) IMPLANT
SUT PROLENE 6 0 BV (SUTURE) ×8 IMPLANT
SUT SILK 2 0 SH (SUTURE) ×2 IMPLANT
SUT VIC AB 3-0 SH 27 (SUTURE) ×2
SUT VIC AB 3-0 SH 27X BRD (SUTURE) ×2 IMPLANT
SUT VICRYL 4-0 PS2 18IN ABS (SUTURE) ×4 IMPLANT
UNDERPAD 30X30 INCONTINENT (UNDERPADS AND DIAPERS) ×2 IMPLANT
WATER STERILE IRR 1000ML POUR (IV SOLUTION) ×2 IMPLANT

## 2015-09-13 NOTE — H&P (View-Only) (Signed)
 [facility]ist of [place]  Patient name: Bradley [name] MRN: [id number] DOB: 06/27/1975 Sex: male  REASON FOR VISIT: Evaluate for new hemodialysis access.  HPI: Bradley [name] is a 40 y.o. male referred for evaluation for new hemodialysis access.  I last saw the patient on 11/08/2014. At that time, I noted that Dr. Lawson attempted a left basilic vein transposition thrombectomy in June of last year. This was not successful and therefore he required placement of a 4-7 mm PTFE graft in the left forearm. He had some dehiscence of the wound in the left arm but this ultimately healed.  Since that time he is had multiple problems with his left forearm graft. He's had multiple procedures and has required stent placement in the antecubital level on the left and also on the upper arm on the left. Once this ultimately failed he had a left IJ catheter placed.  He comes in for evaluation for new access. I have reviewed his history in detail and he is had a forearm and upper arm fistula on both sides including a basilic vein transposition on both sides. Therefore he is clearly not a candidate for a fistula and either arm.   Past Medical History  Diagnosis Date  . Hypertension   . Asthma     Hx: of as a child  . Sleep apnea   . Headache(784.0)   . Arthritis   . Back pain     Hx: of back injury  . Family history of adverse reaction to anesthesia     Mother- awoke during leg amputation, per mother  . Renal disorder     ESRD    Family History  Problem Relation Age of Onset  . Hypertension Mother   . Peripheral vascular disease Mother   . Diabetes Father   . Hyperlipidemia Father   . Hypertension Father   . Peripheral vascular disease Maternal Grandmother     SOCIAL HISTORY: Social History  Substance Use Topics  . Smoking status: Current Some Day Smoker -- 0.50 packs/day for 15 years    Types: Cigarettes  . Smokeless tobacco: Never Used  . Alcohol Use: 0.6 oz/week   1 Cans of beer per week    Allergies  Allergen Reactions  . Penicillins Anaphylaxis  . Wellbutrin [Bupropion] Rash    Current Outpatient Prescriptions  Medication Sig Dispense Refill  . amLODipine (NORVASC) 10 MG tablet Take 10 mg by mouth daily.  0  . aspirin EC 325 MG tablet Take 325 mg by mouth daily.    . calcium acetate (PHOSLO) 667 MG capsule Take 1,334 mg by mouth 5 (five) times daily. With meals and with snacks  0  . cinacalcet (SENSIPAR) 90 MG tablet Take 90 mg by mouth 2 (two) times daily.     . folic acid (FOLVITE) 1 MG tablet Take 1 mg by mouth daily.    . loperamide (IMODIUM) 2 MG capsule Take 2 capsules (4 mg total) by mouth 4 (four) times daily as needed for diarrhea or loose stools. 20 capsule 1  . multivitamin (RENA-VIT) TABS tablet Take 1 tablet by mouth daily.      . oxyCODONE (OXY IR/ROXICODONE) 5 MG immediate release tablet Take 1 tablet (5 mg total) by mouth every 6 (six) hours as needed for severe pain. 15 tablet 0  . Oxycodone HCl 10 MG TABS Take 10 mg by mouth 3 (three) times daily.  0  . Probiotic Product (ALIGN) 4 MG CAPS Take 1   capsule (4 mg total) by mouth 2 (two) times daily. 30 capsule 1  . sucroferric oxyhydroxide (VELPHORO) 500 MG chewable tablet Chew 500-1,000 mg by mouth See admin instructions. Take 2 tablets (1000 mg) with meals and 1 tablet (500 mg) with snacks    . warfarin (COUMADIN) 5 MG tablet Take 5-10 mg by mouth See admin instructions. To start after procedure: take 2 tablets (10 mg) by mouth  daily for 3 days, then take 1 tablet (5 mg) daily  0  . warfarin (COUMADIN) 7.5 MG tablet Take 7.5 mg by mouth daily.     No current facility-administered medications for this visit.    REVIEW OF SYSTEMS:  [X]  denotes positive finding, [ ]  denotes negative finding Cardiac  Comments:  Chest pain or chest pressure:    Shortness of breath upon exertion:    Short of breath when lying flat:    Irregular heart rhythm:        Vascular    Pain in calf,  thigh, or hip brought on by ambulation:    Pain in feet at night that wakes you up from your sleep:     Blood clot in your veins:    Leg swelling:         Pulmonary    Oxygen at home:    Productive cough:     Wheezing:         Neurologic    Sudden weakness in arms or legs:     Sudden numbness in arms or legs:     Sudden onset of difficulty speaking or slurred speech:    Temporary loss of vision in one eye:     Problems with dizziness:         Gastrointestinal    Blood in stool:     Vomited blood:         Genitourinary    Burning when urinating:     Blood in urine:        Psychiatric    Major depression:         Hematologic    Bleeding problems:    Problems with blood clotting too easily:        Skin    Rashes or ulcers:        Constitutional    Fever or chills:      PHYSICAL EXAM: There were no vitals filed for this visit.  GENERAL: The patient is a well-nourished male, in no acute distress. The vital signs are documented above. CARDIAC: There is a regular rate and rhythm.  VASCULAR: he has diminished radial pulses bilaterally but reasonable Doppler signals in both wrists. PULMONARY: There is good air exchange bilaterally without wheezing or rales. ABDOMEN: Soft and non-tender with normal pitched bowel sounds.  MUSCULOSKELETAL: There are no major deformities or cyanosis. NEUROLOGIC: No focal weakness or paresthesias are detected. SKIN: There are no ulcers or rashes noted. PSYCHIATRIC: The patient has a normal affect.  DATA:  I have reviewed the study that was done last week. I have also discussed the procedure with Dr. Juel BurrowLin. He states that there was not any central venous stenosis on the left.  MEDICAL ISSUES:  STAGE V CHRONIC KIDNEY DISEASE: In the office today we discussed placing a new graft in the left upper arm. There is no central venous stenosis. However, when he had access in the left arm before that was functioning and a left IJ catheter he had  problems with arm swelling. This reason I think  it might be better to place the new graft in the right arm. We will stop his Coumadin. This was being used to maintain graft patency and any currently does not have a functioning graft. Once his wounds have healed that I think we could restart his Coumadin to help maintain graft patency. This surgery has been scheduled for 09/13/2015 which is a nondialysis day.   Waverly Ferrariickson, Tejay Hubert [facility] [place] [facility] [phone]

## 2015-09-13 NOTE — Progress Notes (Signed)
Patient asking when or if he should restart the coumadin they were on. Dr Edilia Boickson notified, stated that the patient may restart his prescribed coumadin tonight, June 22. Patient informed.

## 2015-09-13 NOTE — Interval H&P Note (Signed)
History and Physical Interval Note:  09/13/2015 10:18 AM  Bradley [name]  has presented today for surgery, with the diagnosis of End Stage Renal Disease N18.6  The various methods of treatment have been discussed with the patient and family. After consideration of risks, benefits and other options for treatment, the patient has consented to  Procedure(s): INSERTION OF ARTERIOVENOUS (AV) GORE-TEX GRAFT ARM-RIGHT UPPER ARM; IMMEDIATE STICK GRAFT (Right) as a surgical intervention .  The patient's history has been reviewed, patient examined, no change in status, stable for surgery.  I have reviewed the patient's chart and labs.  Questions were answered to the patient's satisfaction.     Waverly Ferrariickson, Christopher

## 2015-09-13 NOTE — Op Note (Signed)
    [name]: Bradley [name]   MRN: [id number] DOB: [birth year]    DATE OF OPERATION: 09/13/2015  PREOP DIAGNOSIS: Stage V chronic kidney disease  POSTOP DIAGNOSIS: Same  PROCEDURE: New right forearm 4-7 mm PTFE graft (Acuseal)  SURGEON: Di Kindlehristopher S. Edilia Boickson, MD, FACS  ASSIST: Karsten RoKim Trinh, [facility]  ANESTHESIA: Gen.   EBL: minimal  INDICATIONS: Bradley [name] is a 40 y.o. male who presents for new access. He has a left IJ tunnel dialysis catheter and and had problems with arm swelling on the left after her previous forearm graft. I therefore elected to use the right arm.  FINDINGS: 4 mm brachial vein. 3.5 mm brachial artery.  The arterial half of the graft is along the radial aspect of the forearm. Venous Apligraf is along the ulnar aspect of the forearm.  TECHNIQUE: The patient was taken to the operating room and received a general anesthetic. The right upper extremity was prepped and draped in usual sterile fashion. A transverse incision was made just above the antecubital level. Here the brachial artery and adjacent brachial veins were dissected free. It was somewhat small but reasonable in size.  Using one distal counter incision a 4-7 mm Acuseal PTFE graft was tunneled in a loop fashion in the forearm with the arterial aspect of the graft along the radial aspect of the forearm.  The patient was then heparinized. Attention was first turned to the arterial anastomosis. The brachial artery was clamped proximally and distally and a longitudinal arteriotomy was made. A segment of the 4 mm end of the graft was excised, the graft slightly spatulated, and sewn end-to-side to the artery using continuous 6-0 Prolene suture.  The graft was then pulled the appropriate length for anastomosis to the brachial vein. The vein was clamped proximally and ligated distally. It was then spatulated. The graft was spatulated and sewn into and to the vein using 2 continuous 6-0 Prolene sutures. At the completion was a good  thrill in the vein. Given the thickness of the graft its difficult to palpate a thrill in the graft itself. The heparin was partially reversed with protamine. Each of the wounds was closed with deep 3-0 Vicryl and the skin closed with 4-0 Vicryl. Liquid band was applied. Patient tolerated the procedure well and transferred to the recovery room in stable condition. All needle and sponge counts were correct.  Bradley Ferrarihristopher Rashel Okeefe, MD, FACS [facility]  DATE OF DICTATION:   09/13/2015

## 2015-09-13 NOTE — Anesthesia Preprocedure Evaluation (Addendum)
Anesthesia Evaluation  Patient identified by MRN, date of birth, ID band Patient awake    Reviewed: Allergy & Precautions, H&P , NPO status , Patient's Chart, lab work & pertinent test results  Airway Mallampati: II  TM Distance: >3 FB Neck ROM: Full    Dental no notable dental hx. (+) Teeth Intact, Dental Advisory Given   Pulmonary asthma , sleep apnea , Current Smoker,    Pulmonary exam normal breath sounds clear to auscultation       Cardiovascular hypertension, Pt. on medications  Rhythm:Regular Rate:Normal     Neuro/Psych  Headaches, negative psych ROS   GI/Hepatic negative GI ROS, Neg liver ROS,   Endo/Other  Morbid obesity  Renal/GU ESRF and DialysisRenal disease  negative genitourinary   Musculoskeletal  (+) Arthritis , Osteoarthritis,    Abdominal   Peds  Hematology negative hematology ROS (+)   Anesthesia Other Findings   Reproductive/Obstetrics negative OB ROS                            Anesthesia Physical  Anesthesia Plan  ASA: III  Anesthesia Plan: General   Post-op Pain Management:    Induction: Intravenous  Airway Management Planned: LMA and Oral ETT  Additional Equipment:   Intra-op Plan:   Post-operative Plan: Extubation in OR  Informed Consent: I have reviewed the patients History and Physical, chart, labs and discussed the procedure including the risks, benefits and alternatives for the proposed anesthesia with the patient or authorized representative who has indicated his/her understanding and acceptance.   Dental advisory given  Plan Discussed with: CRNA, Anesthesiologist and Surgeon  Anesthesia Plan Comments:        Anesthesia Quick Evaluation

## 2015-09-13 NOTE — Anesthesia Postprocedure Evaluation (Signed)
Anesthesia Post Note  Patient: Bradley [name]  Procedure(s) Performed: Procedure(s) (LRB): INSERTION OF ARTERIOVENOUS (AV) GORE-TEX GRAFT ARM-RIGHT UPPER ARM; IMMEDIATE STICK GRAFT (Right)  Patient location during evaluation: PACU Anesthesia Type: General Level of consciousness: sedated Pain management: pain level controlled Vital Signs Assessment: post-procedure vital signs reviewed and stable Respiratory status: spontaneous breathing and respiratory function stable Cardiovascular status: stable Anesthetic complications: no    Last Vitals:  Filed Vitals:   09/13/15 1310 09/13/15 1325  BP: 152/74 144/78  Pulse: 85 84  Temp:    Resp: 17 17    Last Pain:  Filed Vitals:   09/13/15 1329  PainSc: Asleep                 Shereese Bonnie DANIEL

## 2015-09-13 NOTE — Progress Notes (Signed)
Patient Demographics     Patient Name Sex DOB SSN Address    August Saucereak, Thom B Male Aug 26, 1975 [id number] [place] [id number]    Phone: [phone] [facility](Home)      charge  Received: Today    [name], MD  P V[facility] Charge Pool            PROCEDURE: New right forearm 4-7 mm PTFE graft (Acuseal)   SURGEON: Di Kindlehristopher S. Edilia Boickson, MD, FACS   ASSIST: Karsten RoKim Trinh, [facility]   This is an immediate stick graft. However like him to wait 1 week before using it as he does have a catheter. Once we know that the graft is working well to catheter can be removed.  CD

## 2015-09-13 NOTE — Anesthesia Procedure Notes (Signed)
Procedure Name: LMA Insertion Date/Time: 09/13/2015 10:46 AM Performed by: Rosiland OzMEYERS, Jarrick Fjeld Pre-anesthesia Checklist: Emergency Drugs available, Suction available, Patient being monitored, Timeout performed and Patient identified Patient Re-evaluated:Patient Re-evaluated prior to inductionOxygen Delivery Method: Circle system utilized Preoxygenation: Pre-oxygenation with 100% oxygen Intubation Type: IV induction LMA: LMA inserted LMA Size: 5.0 Number of attempts: 1 Placement Confirmation: positive ETCO2 Tube secured with: Tape Dental Injury: Teeth and Oropharynx as per pre-operative assessment

## 2015-09-13 NOTE — Transfer of Care (Signed)
Immediate Anesthesia Transfer of Care Note  Patient: Bradley [name]  Procedure(s) Performed: Procedure(s): INSERTION OF ARTERIOVENOUS (AV) GORE-TEX GRAFT ARM-RIGHT UPPER ARM; IMMEDIATE STICK GRAFT (Right)  Patient Location: PACU  Anesthesia Type:General  Level of Consciousness: awake, alert , oriented and patient cooperative  Airway & Oxygen Therapy: Patient Spontanous Breathing  Post-op Assessment: Report given to RN, Post -op Vital signs reviewed and stable and Patient moving all extremities X 4  Post vital signs: Reviewed and stable  Last Vitals:  Filed Vitals:   09/13/15 0845  BP: 125/81  Pulse: 73  Temp: 36.9 C  Resp: 18    Last Pain: There were no vitals filed for this visit.    Patients Stated Pain Goal: 3 (09/13/15 [id number])  Complications: No apparent anesthesia complications

## 2015-09-14 ENCOUNTER — Encounter (HOSPITAL_COMMUNITY): Payer: Self-pay | Admitting: Vascular Surgery

## 2015-09-18 ENCOUNTER — Other Ambulatory Visit: Payer: Self-pay | Admitting: *Deleted

## 2015-09-18 ENCOUNTER — Ambulatory Visit (HOSPITAL_COMMUNITY)
Admission: RE | Admit: 2015-09-18 | Discharge: 2015-09-18 | Disposition: A | Discharge status: Home / Self Care | Payer: Medicare Other | Source: Ambulatory Visit | Attending: Vascular Surgery | Admitting: Vascular Surgery

## 2015-09-18 DIAGNOSIS — N186 End stage renal disease: Secondary | ICD-10-CM

## 2015-09-18 DIAGNOSIS — Z4931 Encounter for adequacy testing for hemodialysis: Secondary | ICD-10-CM

## 2015-09-18 DIAGNOSIS — I120 Hypertensive chronic kidney disease with stage 5 chronic kidney disease or end stage renal disease: Secondary | ICD-10-CM | POA: Insufficient documentation

## 2015-09-19 ENCOUNTER — Other Ambulatory Visit: Payer: Self-pay

## 2015-09-21 ENCOUNTER — Other Ambulatory Visit (HOSPITAL_COMMUNITY): Payer: Self-pay | Admitting: *Deleted

## 2015-09-24 ENCOUNTER — Ambulatory Visit (HOSPITAL_COMMUNITY)
Admission: RE | Admit: 2015-09-24 | Discharge: 2015-09-24 | Disposition: A | Discharge status: Home / Self Care | Payer: Medicare Other | Source: Ambulatory Visit | Attending: Nephrology | Admitting: Nephrology

## 2015-09-24 ENCOUNTER — Encounter: Payer: Self-pay | Admitting: Vascular Surgery

## 2015-09-24 DIAGNOSIS — Z5309 Procedure and treatment not carried out because of other contraindication: Secondary | ICD-10-CM | POA: Diagnosis not present

## 2015-09-24 NOTE — Progress Notes (Signed)
Spoke with Dr. Darrick Pennaeterding to clarify phlebotomy orders.  Instructed to removed 500mL of blood via phlebotomy.  No hgb verification needed per Dr. Darrick Pennaeterding.  Hgb will be evaluated in the office.

## 2015-09-24 NOTE — Progress Notes (Signed)
Unable to perform phlebotomy due to several grafts and fistulas in both arms.  Dr. Darrick Pennaeterding notified and stated IV team could assess diatek catheter and remove 500mL of blood waste.  IV team notified and will perform therapeutic phlebotomy.

## 2015-09-26 ENCOUNTER — Other Ambulatory Visit: Payer: Self-pay

## 2015-09-26 ENCOUNTER — Ambulatory Visit (INDEPENDENT_AMBULATORY_CARE_PROVIDER_SITE_OTHER): Payer: Medicare Other | Admitting: Vascular Surgery

## 2015-09-26 ENCOUNTER — Encounter: Payer: Self-pay | Admitting: Vascular Surgery

## 2015-09-26 VITALS — BP 129/88 | HR 84 | Temp 97.7°F | Ht 72.0 in | Wt 319.0 lb

## 2015-09-26 DIAGNOSIS — Z992 Dependence on renal dialysis: Secondary | ICD-10-CM

## 2015-09-26 DIAGNOSIS — N186 End stage renal disease: Secondary | ICD-10-CM

## 2015-09-26 MED ORDER — VANCOMYCIN HCL 10 G IV SOLR
1500.0000 mg | INTRAVENOUS | Status: AC
  Filled 2015-09-26: qty 1500

## 2015-09-26 NOTE — Progress Notes (Signed)
Patient name: Bradley [name] MRN: [id number] DOB: Oct 25, 1975 Sex: male  REASON FOR VISIT: to discuss new access.  HPI: Bradley [name] is a 40 y.o. male who just recently had a right forearm AV graft placed with a Acuseal graft. This clotted in less than a week. He is on Coumadin and he took his last dose last night. He is here to discuss new access. He has a central venous catheter on the left and therefore were trying to stick to the right arm. Dr. Juel BurrowLin has studied the left side and says that there is not a central venous stenosis on the left.  He dialyzes at 6 PM on Monday Wednesdays and Fridays.  Current Outpatient Prescriptions  Medication Sig Dispense Refill  . amLODipine (NORVASC) 10 MG tablet Take 10 mg by mouth daily.  0  . calcium acetate (PHOSLO) 667 MG capsule Take 1,334 mg by mouth 5 (five) times daily. With meals and with snacks  0  . cinacalcet (SENSIPAR) 90 MG tablet Take 90 mg by mouth 2 (two) times daily.     . multivitamin (RENA-VIT) TABS tablet Take 1 tablet by mouth daily.      . Oxycodone HCl 10 MG TABS Take 10 mg by mouth 3 (three) times daily. Reported on 09/05/2015  0  . sucroferric oxyhydroxide (VELPHORO) 500 MG chewable tablet Chew 500-1,000 mg by mouth See admin instructions. Take 2 tablets (1000 mg) with meals and 1 tablet (500 mg) with snacks    . warfarin (COUMADIN) 7.5 MG tablet Take 7.5 mg by mouth daily.   0  . loperamide (IMODIUM) 2 MG capsule Take 2 capsules (4 mg total) by mouth 4 (four) times daily as needed for diarrhea or loose stools. (Patient not taking: Reported on 09/26/2015) 20 capsule 1  . oxyCODONE (OXY IR/ROXICODONE) 5 MG immediate release tablet Take 1 tablet (5 mg total) by mouth every 6 (six) hours as needed for severe pain. (Patient not taking: Reported on 09/26/2015) 20 tablet 0   No current facility-administered medications for this visit.   Facility-Administered Medications Ordered in Other Visits  Medication Dose Route Frequency Provider Last  Rate Last Dose  . [START ON 09/27/2015] vancomycin (VANCOCIN) 1,500 mg in sodium chloride 0.9 % 500 mL IVPB  1,500 mg Intravenous To SS-Surg [name], MD        REVIEW OF SYSTEMS:  [X]  denotes positive finding, [ ]  denotes negative finding Cardiac  Comments:  Chest pain or chest pressure:    Shortness of breath upon exertion:    Short of breath when lying flat:    Irregular heart rhythm:    Constitutional    Fever or chills:      PHYSICAL EXAM: Filed Vitals:   09/26/15 0906 09/26/15 0908  BP: 150/94 129/88  Pulse: 84   Temp: 97.7 F (36.5 C)   TempSrc: Oral   Height: 6' (1.829 m)   Weight: 319 lb (144.697 kg)   SpO2: 99%     GENERAL: The patient is a well-nourished male, in no acute distress. The vital signs are documented above. CARDIOVASCULAR: There is a regular rate and rhythm. PULMONARY: There is good air exchange bilaterally without wheezing or rales. His right forearm graft is occluded. He has some collaterals around his right shoulder.  MEDICAL ISSUES:  END-STAGE RENAL DISEASE: I think the next logical place to place a graft would be his right upper arm. However, he has some collaterals around the shoulder and therefore we will  obtain a right upper extremity venogram prior to scheduling placement of a new right upper arm graft. Given that the Acuseal graft clotted quickly I think will use a standard graft. If he has a central venous stenosis on the right he still could potentially have an upper arm graft on the left although on concerned about problems with arm swelling given that his catheters on the left. His venogram is scheduled for 10/01/2015. His new graft is scheduled for 10/04/2015. He will stop his Coumadin 4 days prior to his venogram.  Waverly Ferrariickson, Christopher [facility] [phone]

## 2015-09-28 ENCOUNTER — Other Ambulatory Visit: Payer: Self-pay | Admitting: Nephrology

## 2015-09-28 DIAGNOSIS — D751 Secondary polycythemia: Secondary | ICD-10-CM

## 2015-10-01 ENCOUNTER — Ambulatory Visit (HOSPITAL_COMMUNITY)
Admission: RE | Admit: 2015-10-01 | Discharge: 2015-10-01 | Disposition: A | Discharge status: Home / Self Care | Payer: Medicare Other | Source: Ambulatory Visit | Attending: Vascular Surgery | Admitting: Vascular Surgery

## 2015-10-01 ENCOUNTER — Encounter (HOSPITAL_COMMUNITY): Payer: Self-pay | Admitting: Vascular Surgery

## 2015-10-01 ENCOUNTER — Encounter (HOSPITAL_COMMUNITY)
Admission: RE | Disposition: A | Discharge status: Home / Self Care | Payer: Self-pay | Source: Ambulatory Visit | Attending: Vascular Surgery

## 2015-10-01 DIAGNOSIS — N186 End stage renal disease: Secondary | ICD-10-CM | POA: Insufficient documentation

## 2015-10-01 DIAGNOSIS — Z7901 Long term (current) use of anticoagulants: Secondary | ICD-10-CM | POA: Diagnosis not present

## 2015-10-01 DIAGNOSIS — T82898A Other specified complication of vascular prosthetic devices, implants and grafts, initial encounter: Secondary | ICD-10-CM

## 2015-10-01 DIAGNOSIS — I82B21 Chronic embolism and thrombosis of right subclavian vein: Secondary | ICD-10-CM | POA: Insufficient documentation

## 2015-10-01 HISTORY — PX: PERIPHERAL VASCULAR CATHETERIZATION: SHX172C

## 2015-10-01 LAB — POCT I-STAT, CHEM 8
BUN: 58 mg/dL — ABNORMAL HIGH (ref 6–20)
CREATININE: 13 mg/dL — AB (ref 0.61–1.24)
Calcium, Ion: 1 mmol/L — ABNORMAL LOW (ref 1.13–1.30)
Chloride: 101 mmol/L (ref 101–111)
Glucose, Bld: 89 mg/dL (ref 65–99)
HEMATOCRIT: 44 % (ref 39.0–52.0)
HEMOGLOBIN: 15 g/dL (ref 13.0–17.0)
Potassium: 5.1 mmol/L (ref 3.5–5.1)
SODIUM: 137 mmol/L (ref 135–145)
TCO2: 24 mmol/L (ref 0–100)

## 2015-10-01 LAB — PROTIME-INR
INR: 1.15 (ref 0.00–1.49)
PROTHROMBIN TIME: 14.9 s (ref 11.6–15.2)

## 2015-10-01 SURGERY — UPPER EXTREMITY VENOGRAPHY
Anesthesia: LOCAL | Laterality: Right

## 2015-10-01 MED ORDER — SODIUM CHLORIDE 0.9% FLUSH: 3.0000 mL | INTRAVENOUS | Status: DC | PRN

## 2015-10-01 MED ORDER — IODIXANOL 320 MG/ML IV SOLN
INTRAVENOUS | Status: DC | PRN
  Administered 2015-10-01: 25 mL via INTRAVENOUS

## 2015-10-01 SURGICAL SUPPLY — 2 items
STOPCOCK MORSE 400PSI 3WAY (MISCELLANEOUS) ×2 IMPLANT
TUBING CIL FLEX 10 FLL-RA (TUBING) ×2 IMPLANT

## 2015-10-01 NOTE — Discharge Instructions (Signed)
Call for any any problems/ question/ concerns. Dr. Edilia Boickson [phone]

## 2015-10-01 NOTE — Op Note (Signed)
   [name]: [name]   MRN: [id number] DOB: [birth year]    DATE OF PROCEDURE: 10/01/2015  INDICATIONS: Bradley [name] B Lucarelli is a 40 y.o. male who presents for a central venogram on the right before placing a new right arm graft.  PROCEDURE:  Central venogram right upper extremity  SURGEON: Di KindleChristopher S. Edilia Boickson, MD, FACS  ANESTHESIA: nnone   EBL: minimal  TECHNIQUE: The patient was brought to the peripheral vascular lab after a peripheral IV was placed in the right antecubital space. Contrast was injected to the IV and the central veins were evaluated from the antecubital space to the central veins.  FINDINGS:  1. 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 Fluoroscopy demonstrates that there are stents extending up to the subclavian vein on the left.  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 Likewise the patient is not a candidate for an upper arm graft on the left because the stents extend all the way up to the subclavian vein. The patient will be scheduled for a right thigh AV graft. His Coumadin is on hold.  Waverly Ferrarihristopher Savreen Gebhardt, MD, FACS [facility]  DATE OF DICTATION:   10/01/2015

## 2015-10-01 NOTE — Interval H&P Note (Signed)
History and Physical Interval Note:  10/01/2015 10:45 AM  Durelle B Cuppett  has presented today for surgery, with the diagnosis of Chronic Kidney Disease  The various methods of treatment have been discussed with the patient and family. After consideration of risks, benefits and other options for treatment, the patient has consented to  Procedure(s): Upper Extremity Venography (Right) as a surgical intervention .  The patient's history has been reviewed, patient examined, no change in status, stable for surgery.  I have reviewed the patient's chart and labs.  Questions were answered to the patient's satisfaction.     Waverly Ferrariickson, Armanie Ullmer

## 2015-10-01 NOTE — H&P (View-Only) (Signed)
Patient name: Bradley [name] MRN: [id number] DOB: Oct 25, 1975 Sex: male  REASON FOR VISIT: to discuss new access.  HPI: Bradley [name] is a 40 y.o. male who just recently had a right forearm AV graft placed with a Acuseal graft. This clotted in less than a week. He is on Coumadin and he took his last dose last night. He is here to discuss new access. He has a central venous catheter on the left and therefore were trying to stick to the right arm. Dr. Juel BurrowLin has studied the left side and says that there is not a central venous stenosis on the left.  He dialyzes at 6 PM on Monday Wednesdays and Fridays.  Current Outpatient Prescriptions  Medication Sig Dispense Refill  . amLODipine (NORVASC) 10 MG tablet Take 10 mg by mouth daily.  0  . calcium acetate (PHOSLO) 667 MG capsule Take 1,334 mg by mouth 5 (five) times daily. With meals and with snacks  0  . cinacalcet (SENSIPAR) 90 MG tablet Take 90 mg by mouth 2 (two) times daily.     . multivitamin (RENA-VIT) TABS tablet Take 1 tablet by mouth daily.      . Oxycodone HCl 10 MG TABS Take 10 mg by mouth 3 (three) times daily. Reported on 09/05/2015  0  . sucroferric oxyhydroxide (VELPHORO) 500 MG chewable tablet Chew 500-1,000 mg by mouth See admin instructions. Take 2 tablets (1000 mg) with meals and 1 tablet (500 mg) with snacks    . warfarin (COUMADIN) 7.5 MG tablet Take 7.5 mg by mouth daily.   0  . loperamide (IMODIUM) 2 MG capsule Take 2 capsules (4 mg total) by mouth 4 (four) times daily as needed for diarrhea or loose stools. (Patient not taking: Reported on 09/26/2015) 20 capsule 1  . oxyCODONE (OXY IR/ROXICODONE) 5 MG immediate release tablet Take 1 tablet (5 mg total) by mouth every 6 (six) hours as needed for severe pain. (Patient not taking: Reported on 09/26/2015) 20 tablet 0   No current facility-administered medications for this visit.   Facility-Administered Medications Ordered in Other Visits  Medication Dose Route Frequency Provider Last  Rate Last Dose  . [START ON 09/27/2015] vancomycin (VANCOCIN) 1,500 mg in sodium chloride 0.9 % 500 mL IVPB  1,500 mg Intravenous To SS-Surg [name], MD        REVIEW OF SYSTEMS:  [X]  denotes positive finding, [ ]  denotes negative finding Cardiac  Comments:  Chest pain or chest pressure:    Shortness of breath upon exertion:    Short of breath when lying flat:    Irregular heart rhythm:    Constitutional    Fever or chills:      PHYSICAL EXAM: Filed Vitals:   09/26/15 0906 09/26/15 0908  BP: 150/94 129/88  Pulse: 84   Temp: 97.7 F (36.5 C)   TempSrc: Oral   Height: 6' (1.829 m)   Weight: 319 lb (144.697 kg)   SpO2: 99%     GENERAL: The patient is a well-nourished male, in no acute distress. The vital signs are documented above. CARDIOVASCULAR: There is a regular rate and rhythm. PULMONARY: There is good air exchange bilaterally without wheezing or rales. His right forearm graft is occluded. He has some collaterals around his right shoulder.  MEDICAL ISSUES:  END-STAGE RENAL DISEASE: I think the next logical place to place a graft would be his right upper arm. However, he has some collaterals around the shoulder and therefore we will  obtain a right upper extremity venogram prior to scheduling placement of a new right upper arm graft. Given that the Acuseal graft clotted quickly I think will use a standard graft. If he has a central venous stenosis on the right he still could potentially have an upper arm graft on the left although on concerned about problems with arm swelling given that his catheters on the left. His venogram is scheduled for 10/01/2015. His new graft is scheduled for 10/04/2015. He will stop his Coumadin 4 days prior to his venogram.  Waverly Ferrariickson, Christopher [facility] [phone]

## 2015-10-03 ENCOUNTER — Encounter: Payer: Self-pay | Admitting: Vascular Surgery

## 2015-10-03 ENCOUNTER — Ambulatory Visit
Admission: RE | Admit: 2015-10-03 | Discharge: 2015-10-03 | Disposition: A | Discharge status: Home / Self Care | Payer: Medicare Other | Source: Ambulatory Visit | Attending: Nephrology | Admitting: Nephrology

## 2015-10-03 ENCOUNTER — Ambulatory Visit (INDEPENDENT_AMBULATORY_CARE_PROVIDER_SITE_OTHER): Payer: Medicare Other | Admitting: Vascular Surgery

## 2015-10-03 VITALS — BP 124/78 | HR 101 | Temp 98.1°F | Resp 18 | Ht 72.0 in | Wt 317.3 lb

## 2015-10-03 DIAGNOSIS — D751 Secondary polycythemia: Secondary | ICD-10-CM

## 2015-10-03 DIAGNOSIS — Z992 Dependence on renal dialysis: Secondary | ICD-10-CM

## 2015-10-03 DIAGNOSIS — N186 End stage renal disease: Secondary | ICD-10-CM

## 2015-10-03 NOTE — Progress Notes (Signed)
Patient name: Bradley [name] MRN: [id number] DOB: 05/09/1975 Sex: male  REASON FOR VISIT: to discuss placement of thigh graft.  HPI: Bradley [name] is a 40 y.o. male who has had a challenging access history. He had a basilic vein transposition in the left arm and subsequent a left forearm graft. He had multiple interventional procedures and has stents which extended up to his subclavian artery on the left.  On the right arm he had a forearm graft placed which occluded. 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 Therefore he is not a candidate for placement of access in his right arm.  He comes in to discuss placing a thigh graft. He feels strongly that he is close to getting to the weight that he needs to be to be considered for kidney transplant. Therefore currently he does not want to proceed with placement of the thigh graft. He has a functioning tunneled dialysis catheter in the left IJ.  Current Outpatient Prescriptions  Medication Sig Dispense Refill  . amLODipine (NORVASC) 10 MG tablet Take 10 mg by mouth daily.  0  . calcium acetate (PHOSLO) 667 MG capsule Take 1,334 mg by mouth 5 (five) times daily. With meals and with snacks  0  . cinacalcet (SENSIPAR) 90 MG tablet Take 90 mg by mouth 2 (two) times daily.     Marland Kitchen loperamide (IMODIUM) 2 MG capsule Take 2 capsules (4 mg total) by mouth 4 (four) times daily as needed for diarrhea or loose stools. 20 capsule 1  . multivitamin (RENA-VIT) TABS tablet Take 1 tablet by mouth daily.      Marland Kitchen oxyCODONE (OXY IR/ROXICODONE) 5 MG immediate release tablet Take 1 tablet (5 mg total) by mouth every 6 (six) hours as needed for severe pain. 20 tablet 0  . Oxycodone HCl 10 MG TABS Take 10 mg by mouth 3 (three) times daily. Reported on 09/05/2015  0  . sucroferric oxyhydroxide (VELPHORO) 500 MG chewable tablet Chew 500-1,000 mg by mouth See admin instructions. Take 2 tablets  (1000 mg) with meals and 1 tablet (500 mg) with snacks    . warfarin (COUMADIN) 7.5 MG tablet Take 7.5 mg by mouth daily.     No current facility-administered medications for this visit.    REVIEW OF SYSTEMS:   denotes positive finding,  denotes negative finding Cardiac  Comments:  Chest pain or chest pressure:    Shortness of breath upon exertion:    Short of breath when lying flat:    Irregular heart rhythm:    Constitutional    Fever or chills:      PHYSICAL EXAM: Filed Vitals:   10/03/15 1147  BP: 124/78  Pulse: 101  Temp: 98.1 F (36.7 C)  TempSrc: Oral  Resp: 18  Height: 6' (1.829 m)  Weight: 317 lb 4.8 oz (143.926 kg)  SpO2: 98%    GENERAL: The patient is a well-nourished male, in no acute distress. The vital signs are documented above. CARDIOVASCULAR: There is a regular rate and rhythm. PULMONARY: There is good air exchange bilaterally without wheezing or rales.  MEDICAL ISSUES:  END-STAGE RENAL DISEASE: The patient has a functioning tunneled dialysis catheter. He is not a candidate for upper arm access at this point. 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 He will require placement of a  thigh graft which currently he is not willing to proceed with. We had a long discussion about nutrition today and I think he is very motivated to achieve the weight he will need to be considered for kidney transplant. From my standpoint he does not need to be on Coumadin as this was being used to maintain graft patency.  Bradley [name], Bradley [name] [facility] [phone]

## 2015-10-04 ENCOUNTER — Ambulatory Visit (HOSPITAL_COMMUNITY): Admission: RE | Admit: 2015-10-04 | Payer: Medicare Other | Source: Ambulatory Visit | Admitting: Vascular Surgery

## 2015-10-04 ENCOUNTER — Encounter (HOSPITAL_COMMUNITY): Admission: RE | Payer: Self-pay | Source: Ambulatory Visit

## 2015-10-04 SURGERY — INSERTION OF ARTERIOVENOUS (AV) GORE-TEX GRAFT THIGH
Anesthesia: General | Laterality: Right

## 2015-11-20 ENCOUNTER — Other Ambulatory Visit: Payer: Self-pay | Admitting: *Deleted

## 2015-12-03 ENCOUNTER — Encounter (HOSPITAL_COMMUNITY): Payer: Self-pay | Admitting: *Deleted

## 2015-12-03 MED ORDER — VANCOMYCIN HCL 10 G IV SOLR
1500.0000 mg | INTRAVENOUS | Status: AC
  Administered 2015-12-04: 1500 mg via INTRAVENOUS
  Filled 2015-12-03: qty 1500

## 2015-12-04 ENCOUNTER — Ambulatory Visit (HOSPITAL_COMMUNITY): Payer: Medicare Other | Admitting: Anesthesiology

## 2015-12-04 ENCOUNTER — Observation Stay (HOSPITAL_COMMUNITY)
Admission: RE | Admit: 2015-12-04 | Discharge: 2015-12-05 | Disposition: A | Discharge status: Home / Self Care | Payer: Medicare Other | Source: Ambulatory Visit | Attending: Vascular Surgery | Admitting: Vascular Surgery

## 2015-12-04 ENCOUNTER — Encounter (HOSPITAL_COMMUNITY)
Admission: RE | Disposition: A | Discharge status: Home / Self Care | Payer: Self-pay | Source: Ambulatory Visit | Attending: Vascular Surgery

## 2015-12-04 ENCOUNTER — Observation Stay (HOSPITAL_COMMUNITY): Payer: Medicare Other

## 2015-12-04 ENCOUNTER — Ambulatory Visit (HOSPITAL_COMMUNITY): Payer: Medicare Other

## 2015-12-04 DIAGNOSIS — Z79899 Other long term (current) drug therapy: Secondary | ICD-10-CM | POA: Insufficient documentation

## 2015-12-04 DIAGNOSIS — Z6841 Body Mass Index (BMI) 40.0 and over, adult: Secondary | ICD-10-CM | POA: Diagnosis not present

## 2015-12-04 DIAGNOSIS — I120 Hypertensive chronic kidney disease with stage 5 chronic kidney disease or end stage renal disease: Secondary | ICD-10-CM | POA: Diagnosis not present

## 2015-12-04 DIAGNOSIS — F17200 Nicotine dependence, unspecified, uncomplicated: Secondary | ICD-10-CM | POA: Insufficient documentation

## 2015-12-04 DIAGNOSIS — N185 Chronic kidney disease, stage 5: Secondary | ICD-10-CM

## 2015-12-04 DIAGNOSIS — Z992 Dependence on renal dialysis: Secondary | ICD-10-CM | POA: Insufficient documentation

## 2015-12-04 DIAGNOSIS — G4730 Sleep apnea, unspecified: Secondary | ICD-10-CM | POA: Insufficient documentation

## 2015-12-04 DIAGNOSIS — N186 End stage renal disease: Secondary | ICD-10-CM | POA: Diagnosis not present

## 2015-12-04 DIAGNOSIS — Z419 Encounter for procedure for purposes other than remedying health state, unspecified: Secondary | ICD-10-CM

## 2015-12-04 DIAGNOSIS — Z7901 Long term (current) use of anticoagulants: Secondary | ICD-10-CM | POA: Diagnosis not present

## 2015-12-04 HISTORY — PX: AV FISTULA PLACEMENT: SHX1204

## 2015-12-04 HISTORY — PX: EXCHANGE OF A DIALYSIS CATHETER: SHX5818

## 2015-12-04 LAB — POCT I-STAT 4, (NA,K, GLUC, HGB,HCT)
Glucose, Bld: 90 mg/dL (ref 65–99)
HCT: 44 % (ref 39.0–52.0)
HEMOGLOBIN: 15 g/dL (ref 13.0–17.0)
POTASSIUM: 6.1 mmol/L — AB (ref 3.5–5.1)
Sodium: 140 mmol/L (ref 135–145)

## 2015-12-04 LAB — CREATININE, SERUM
Creatinine, Ser: 12.37 mg/dL — ABNORMAL HIGH (ref 0.61–1.24)
GFR, EST AFRICAN AMERICAN: 5 mL/min — AB (ref >60)
GFR, EST NON AFRICAN AMERICAN: 4 mL/min — AB (ref >60)

## 2015-12-04 LAB — CBC
HEMATOCRIT: 46.8 % (ref 39.0–52.0)
HEMOGLOBIN: 15.2 g/dL (ref 13.0–17.0)
MCH: 30.1 pg (ref 26.0–34.0)
MCHC: 32.5 g/dL (ref 30.0–36.0)
MCV: 92.7 fL (ref 78.0–100.0)
Platelets: 145 10*3/uL — ABNORMAL LOW (ref 150–400)
RBC: 5.05 MIL/uL (ref 4.22–5.81)
RDW: 15.1 % (ref 11.5–15.5)
WBC: 7.2 10*3/uL (ref 4.0–10.5)

## 2015-12-04 LAB — PROTIME-INR
INR: 1.04
PROTHROMBIN TIME: 13.6 s (ref 11.4–15.2)

## 2015-12-04 LAB — APTT: APTT: 28 s (ref 24–36)

## 2015-12-04 SURGERY — INSERTION OF ARTERIOVENOUS (AV) GORE-TEX GRAFT THIGH
Anesthesia: General | Site: Leg Upper | Laterality: Right

## 2015-12-04 MED ORDER — CALCIUM ACETATE (PHOS BINDER) 667 MG PO CAPS
1334.0000 mg | ORAL_CAPSULE | Freq: Every day | ORAL | Status: DC
  Administered 2015-12-04 – 2015-12-05 (×3): 1334 mg via ORAL
  Filled 2015-12-04 (×3): qty 2

## 2015-12-04 MED ORDER — PHENYLEPHRINE HCL 10 MG/ML IJ SOLN
INTRAMUSCULAR | Status: DC | PRN
  Administered 2015-12-04 (×4): 80 ug via INTRAVENOUS

## 2015-12-04 MED ORDER — MORPHINE SULFATE (PF) 2 MG/ML IV SOLN
2.0000 mg | INTRAVENOUS | Status: DC | PRN
  Administered 2015-12-04: 4 mg via INTRAVENOUS
  Administered 2015-12-04: 2 mg via INTRAVENOUS
  Filled 2015-12-04: qty 2
  Filled 2015-12-04: qty 1

## 2015-12-04 MED ORDER — MIDAZOLAM HCL 2 MG/2ML IJ SOLN
INTRAMUSCULAR | Status: AC
  Filled 2015-12-04: qty 2

## 2015-12-04 MED ORDER — LIDOCAINE 2% (20 MG/ML) 5 ML SYRINGE
INTRAMUSCULAR | Status: AC
  Filled 2015-12-04: qty 5

## 2015-12-04 MED ORDER — SUCROFERRIC OXYHYDROXIDE 500 MG PO CHEW
500.0000 mg | CHEWABLE_TABLET | Freq: Two times a day (BID) | ORAL | Status: DC | PRN
  Filled 2015-12-04: qty 1

## 2015-12-04 MED ORDER — ONDANSETRON HCL 4 MG/2ML IJ SOLN: 4.0000 mg | Freq: Four times a day (QID) | INTRAMUSCULAR | Status: DC | PRN

## 2015-12-04 MED ORDER — VANCOMYCIN HCL IN DEXTROSE 1-5 GM/200ML-% IV SOLN
1000.0000 mg | Freq: Two times a day (BID) | INTRAVENOUS | Status: DC
  Filled 2015-12-04: qty 200

## 2015-12-04 MED ORDER — ENOXAPARIN SODIUM 30 MG/0.3ML ~~LOC~~ SOLN: 30.0000 mg | SUBCUTANEOUS | Status: DC

## 2015-12-04 MED ORDER — PROPOFOL 10 MG/ML IV BOLUS
INTRAVENOUS | Status: AC
  Filled 2015-12-04: qty 20

## 2015-12-04 MED ORDER — VANCOMYCIN HCL IN DEXTROSE 1-5 GM/200ML-% IV SOLN
1000.0000 mg | Freq: Once | INTRAVENOUS | Status: DC
  Filled 2015-12-04: qty 200

## 2015-12-04 MED ORDER — GUAIFENESIN-DM 100-10 MG/5ML PO SYRP: 15.0000 mL | ORAL_SOLUTION | ORAL | Status: DC | PRN

## 2015-12-04 MED ORDER — ALUM & MAG HYDROXIDE-SIMETH 200-200-20 MG/5ML PO SUSP: 15.0000 mL | ORAL | Status: DC | PRN

## 2015-12-04 MED ORDER — HEPARIN SODIUM (PORCINE) 1000 UNIT/ML IJ SOLN
INTRAMUSCULAR | Status: DC | PRN
  Administered 2015-12-04: 10000 [IU] via INTRAVENOUS

## 2015-12-04 MED ORDER — POTASSIUM CHLORIDE CRYS ER 20 MEQ PO TBCR: 20.0000 meq | EXTENDED_RELEASE_TABLET | Freq: Once | ORAL | Status: DC

## 2015-12-04 MED ORDER — PHENYLEPHRINE HCL 10 MG/ML IJ SOLN
INTRAVENOUS | Status: DC | PRN
  Administered 2015-12-04: 15 ug/min via INTRAVENOUS

## 2015-12-04 MED ORDER — ONDANSETRON HCL 4 MG/2ML IJ SOLN
INTRAMUSCULAR | Status: DC | PRN
  Administered 2015-12-04: 4 mg via INTRAVENOUS

## 2015-12-04 MED ORDER — OXYCODONE HCL 5 MG PO TABS: 5.0000 mg | ORAL_TABLET | Freq: Once | ORAL | Status: DC | PRN

## 2015-12-04 MED ORDER — OXYCODONE HCL 5 MG/5ML PO SOLN: 5.0000 mg | Freq: Once | ORAL | Status: DC | PRN

## 2015-12-04 MED ORDER — LABETALOL HCL 5 MG/ML IV SOLN: 10.0000 mg | INTRAVENOUS | Status: DC | PRN

## 2015-12-04 MED ORDER — HEPARIN SODIUM (PORCINE) 1000 UNIT/ML IJ SOLN
INTRAMUSCULAR | Status: AC
  Filled 2015-12-04: qty 1

## 2015-12-04 MED ORDER — ACETAMINOPHEN 160 MG/5ML PO SOLN
325.0000 mg | ORAL | Status: DC | PRN
  Filled 2015-12-04: qty 20.3

## 2015-12-04 MED ORDER — PHENOL 1.4 % MT LIQD: 1.0000 | OROMUCOSAL | Status: DC | PRN

## 2015-12-04 MED ORDER — LOPERAMIDE HCL 2 MG PO CAPS
4.0000 mg | ORAL_CAPSULE | Freq: Four times a day (QID) | ORAL | Status: DC | PRN
Start: 2015-12-04 — End: 2015-12-05

## 2015-12-04 MED ORDER — ACETAMINOPHEN 325 MG PO TABS: 325.0000 mg | ORAL_TABLET | ORAL | Status: DC | PRN

## 2015-12-04 MED ORDER — 0.9 % SODIUM CHLORIDE (POUR BTL) OPTIME
TOPICAL | Status: DC | PRN
  Administered 2015-12-04: 1000 mL

## 2015-12-04 MED ORDER — THROMBIN 20000 UNITS EX SOLR
CUTANEOUS | Status: DC | PRN
  Administered 2015-12-04: 10:00:00 via TOPICAL

## 2015-12-04 MED ORDER — PROPOFOL 10 MG/ML IV BOLUS
INTRAVENOUS | Status: DC | PRN
  Administered 2015-12-04: 30 mg via INTRAVENOUS
  Administered 2015-12-04: 200 mg via INTRAVENOUS

## 2015-12-04 MED ORDER — PANTOPRAZOLE SODIUM 40 MG PO TBEC
40.0000 mg | DELAYED_RELEASE_TABLET | Freq: Every day | ORAL | Status: DC
  Administered 2015-12-04: 40 mg via ORAL
  Filled 2015-12-04: qty 1

## 2015-12-04 MED ORDER — CHLORHEXIDINE GLUCONATE CLOTH 2 % EX PADS: 6.0000 | MEDICATED_PAD | Freq: Once | CUTANEOUS | Status: DC

## 2015-12-04 MED ORDER — LIDOCAINE HCL (CARDIAC) 20 MG/ML IV SOLN
INTRAVENOUS | Status: DC | PRN
  Administered 2015-12-04: 60 mg via INTRAVENOUS

## 2015-12-04 MED ORDER — FENTANYL CITRATE (PF) 100 MCG/2ML IJ SOLN
INTRAMUSCULAR | Status: AC
  Filled 2015-12-04: qty 2

## 2015-12-04 MED ORDER — FENTANYL CITRATE (PF) 100 MCG/2ML IJ SOLN
INTRAMUSCULAR | Status: DC | PRN
  Administered 2015-12-04 (×2): 25 ug via INTRAVENOUS
  Administered 2015-12-04: 50 ug via INTRAVENOUS
  Administered 2015-12-04 (×2): 25 ug via INTRAVENOUS
  Administered 2015-12-04: 50 ug via INTRAVENOUS

## 2015-12-04 MED ORDER — PROTAMINE SULFATE 10 MG/ML IV SOLN
INTRAVENOUS | Status: DC | PRN
  Administered 2015-12-04 (×5): 10 mg via INTRAVENOUS

## 2015-12-04 MED ORDER — THROMBIN 20000 UNITS EX SOLR
CUTANEOUS | Status: AC
  Filled 2015-12-04: qty 20000

## 2015-12-04 MED ORDER — MIDAZOLAM HCL 5 MG/5ML IJ SOLN
INTRAMUSCULAR | Status: DC | PRN
  Administered 2015-12-04: 2 mg via INTRAVENOUS

## 2015-12-04 MED ORDER — SODIUM CHLORIDE 0.9 % IV SOLN
INTRAVENOUS | Status: DC
  Administered 2015-12-04: 13:00:00 via INTRAVENOUS

## 2015-12-04 MED ORDER — AMLODIPINE BESYLATE 10 MG PO TABS: 10.0000 mg | ORAL_TABLET | Freq: Every day | ORAL | Status: DC

## 2015-12-04 MED ORDER — ACETAMINOPHEN 325 MG RE SUPP: 325.0000 mg | RECTAL | Status: DC | PRN

## 2015-12-04 MED ORDER — OXYCODONE-ACETAMINOPHEN 5-325 MG PO TABS
1.0000 | ORAL_TABLET | ORAL | Status: DC | PRN
  Administered 2015-12-04 – 2015-12-05 (×2): 2 via ORAL
  Filled 2015-12-04 (×2): qty 2

## 2015-12-04 MED ORDER — ONDANSETRON HCL 4 MG/2ML IJ SOLN
INTRAMUSCULAR | Status: AC
  Filled 2015-12-04: qty 2

## 2015-12-04 MED ORDER — METOPROLOL TARTRATE 5 MG/5ML IV SOLN: 2.0000 mg | INTRAVENOUS | Status: DC | PRN

## 2015-12-04 MED ORDER — SODIUM CHLORIDE 0.9 % IV SOLN
INTRAVENOUS | Status: DC | PRN
  Administered 2015-12-04: 07:00:00

## 2015-12-04 MED ORDER — SUCROFERRIC OXYHYDROXIDE 500 MG PO CHEW
1000.0000 mg | CHEWABLE_TABLET | Freq: Three times a day (TID) | ORAL | Status: DC
  Administered 2015-12-04 – 2015-12-05 (×2): 1000 mg via ORAL
  Filled 2015-12-04 (×3): qty 2

## 2015-12-04 MED ORDER — SODIUM CHLORIDE 0.9 % IV SOLN
INTRAVENOUS | Status: DC
  Administered 2015-12-04 (×2): via INTRAVENOUS

## 2015-12-04 MED ORDER — HYDRALAZINE HCL 20 MG/ML IJ SOLN: 5.0000 mg | INTRAMUSCULAR | Status: DC | PRN

## 2015-12-04 MED ORDER — CINACALCET HCL 30 MG PO TABS
90.0000 mg | ORAL_TABLET | Freq: Two times a day (BID) | ORAL | Status: DC
  Administered 2015-12-04 – 2015-12-05 (×2): 90 mg via ORAL
  Filled 2015-12-04 (×3): qty 3

## 2015-12-04 MED ORDER — FENTANYL CITRATE (PF) 100 MCG/2ML IJ SOLN
25.0000 ug | INTRAMUSCULAR | Status: DC | PRN
  Administered 2015-12-04: 50 ug via INTRAVENOUS

## 2015-12-04 MED ORDER — HEPARIN SODIUM (PORCINE) 1000 UNIT/ML IJ SOLN
INTRAMUSCULAR | Status: DC | PRN
  Administered 2015-12-04: 3.8 mL

## 2015-12-04 MED ORDER — PHENYLEPHRINE 40 MCG/ML (10ML) SYRINGE FOR IV PUSH (FOR BLOOD PRESSURE SUPPORT)
PREFILLED_SYRINGE | INTRAVENOUS | Status: AC
  Filled 2015-12-04: qty 10

## 2015-12-04 MED ORDER — FENTANYL CITRATE (PF) 100 MCG/2ML IJ SOLN
INTRAMUSCULAR | Status: AC
  Filled 2015-12-04: qty 4

## 2015-12-04 SURGICAL SUPPLY — 43 items
BIOPATCH RED 1 DISK 7.0 (GAUZE/BANDAGES/DRESSINGS) ×3 IMPLANT
CANISTER SUCTION 2500CC (MISCELLANEOUS) ×3 IMPLANT
CANNULA VESSEL 3MM 2 BLNT TIP (CANNULA) IMPLANT
CATH PALINDROME REV 28CM (CATHETERS) ×3 IMPLANT
CLIP TI MEDIUM 6 (CLIP) ×3 IMPLANT
CLIP TI WIDE RED SMALL 6 (CLIP) ×3 IMPLANT
COVER SURGICAL LIGHT HANDLE (MISCELLANEOUS) ×3 IMPLANT
DRAPE C-ARM 42X72 X-RAY (DRAPES) ×3 IMPLANT
DRAPE INCISE IOBAN 66X45 STRL (DRAPES) ×6 IMPLANT
DRAPE ORTHO SPLIT 77X108 STRL (DRAPES) ×1
DRAPE SURG ORHT 6 SPLT 77X108 (DRAPES) ×2 IMPLANT
DRSG COVADERM 4X6 (GAUZE/BANDAGES/DRESSINGS) ×3 IMPLANT
DRSG TEGADERM 2-3/8X2-3/4 SM (GAUZE/BANDAGES/DRESSINGS) ×3 IMPLANT
ELECT REM PT RETURN 9FT ADLT (ELECTROSURGICAL) ×3
ELECTRODE REM PT RTRN 9FT ADLT (ELECTROSURGICAL) ×2 IMPLANT
GAUZE SPONGE 2X2 8PLY STRL LF (GAUZE/BANDAGES/DRESSINGS) ×2 IMPLANT
GLOVE BIO SURGEON STRL SZ7.5 (GLOVE) ×3 IMPLANT
GLOVE BIOGEL PI IND STRL 8 (GLOVE) ×2 IMPLANT
GLOVE BIOGEL PI INDICATOR 8 (GLOVE) ×1
GOWN STRL REUS W/ TWL LRG LVL3 (GOWN DISPOSABLE) ×6 IMPLANT
GOWN STRL REUS W/TWL LRG LVL3 (GOWN DISPOSABLE) ×3
GRAFT GORETEX STRT 4-7X45 (Vascular Products) ×3 IMPLANT
KIT BASIN OR (CUSTOM PROCEDURE TRAY) ×3 IMPLANT
KIT ROOM TURNOVER OR (KITS) ×3 IMPLANT
LIQUID BAND (GAUZE/BANDAGES/DRESSINGS) ×6 IMPLANT
NEEDLE 18GX1X1/2 (RX/OR ONLY) (NEEDLE) ×3 IMPLANT
NS IRRIG 1000ML POUR BTL (IV SOLUTION) ×3 IMPLANT
PACK CV ACCESS (CUSTOM PROCEDURE TRAY) ×3 IMPLANT
PAD ARMBOARD 7.5X6 YLW CONV (MISCELLANEOUS) ×6 IMPLANT
SPONGE GAUZE 2X2 STER 10/PKG (GAUZE/BANDAGES/DRESSINGS) ×1
SPONGE SURGIFOAM ABS GEL 100 (HEMOSTASIS) ×3 IMPLANT
SUT ETHILON 3 0 PS 1 (SUTURE) ×3 IMPLANT
SUT PROLENE 6 0 BV (SUTURE) ×9 IMPLANT
SUT VIC AB 2-0 CTB1 (SUTURE) ×3 IMPLANT
SUT VIC AB 3-0 SH 27 (SUTURE) ×2
SUT VIC AB 3-0 SH 27X BRD (SUTURE) ×4 IMPLANT
SUT VICRYL 4-0 PS2 18IN ABS (SUTURE) ×6 IMPLANT
SYR 20CC LL (SYRINGE) ×3 IMPLANT
SYR 5ML LL (SYRINGE) ×3 IMPLANT
SYRINGE 10CC LL (SYRINGE) ×3 IMPLANT
TAPE CLOTH SURG 4X10 WHT LF (GAUZE/BANDAGES/DRESSINGS) ×3 IMPLANT
UNDERPAD 30X30 (UNDERPADS AND DIAPERS) ×3 IMPLANT
WATER STERILE IRR 1000ML POUR (IV SOLUTION) ×3 IMPLANT

## 2015-12-04 NOTE — Transfer of Care (Signed)
Immediate Anesthesia Transfer of Care Note  Patient: Lelon PerlaHayward B Stordahl  Procedure(s) Performed: Procedure(s): INSERTION OF ARTERIOVENOUS (AV) GORE-TEX GRAFT THIGH (Right) EXCHANGE OF A DIALYSIS CATHETER (Left)  Patient Location: PACU  Anesthesia Type:General  Level of Consciousness: awake, alert  and oriented  Airway & Oxygen Therapy: Patient Spontanous Breathing and Patient connected to face mask oxygen  Post-op Assessment: Report given to RN, Post -op Vital signs reviewed and stable and Patient moving all extremities X 4  Post vital signs: Reviewed and stable  Last Vitals:  Vitals:   12/04/15 0559 12/04/15 1014  BP: (!) 152/77   Pulse: 84   Resp: 18   Temp: 37.4 C 36.3 C    Last Pain:  Vitals:   12/04/15 0559  TempSrc: Oral         Complications: No apparent anesthesia complications

## 2015-12-04 NOTE — H&P (Signed)
Patient name: Bradley [name]                   MRN: [id number]                  DOB: [birth year]                    Sex: male  REASON FOR VISIT: to discuss placement of thigh graft.  HPI: Lelon PerlaHayward B Mccollister is a 40 y.o. male who has had a challenging access history. He had a basilic vein transposition in the left arm and subsequent a left forearm graft. He had multiple interventional procedures and has stents which extended up to his subclavian vein on the left.  On the right arm he had a forearm graft placed which occluded. 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 Therefore he is not a candidate for placement of access in his right arm.  He comes in to discuss placing a thigh graft. He feels strongly that he is close to getting to the weight that he needs to be to be considered for kidney transplant. Therefore currently he does not want to proceed with placement of the thigh graft. He has a functioning tunneled dialysis catheter in the left IJ.        Current Outpatient Prescriptions  Medication Sig Dispense Refill  . amLODipine (NORVASC) 10 MG tablet Take 10 mg by mouth daily.  0  . calcium acetate (PHOSLO) 667 MG capsule Take 1,334 mg by mouth 5 (five) times daily. With meals and with snacks  0  . cinacalcet (SENSIPAR) 90 MG tablet Take 90 mg by mouth 2 (two) times daily.     Marland Kitchen. loperamide (IMODIUM) 2 MG capsule Take 2 capsules (4 mg total) by mouth 4 (four) times daily as needed for diarrhea or loose stools. 20 capsule 1  . multivitamin (RENA-VIT) TABS tablet Take 1 tablet by mouth daily.      Marland Kitchen. oxyCODONE (OXY IR/ROXICODONE) 5 MG immediate release tablet Take 1 tablet (5 mg total) by mouth every 6 (six) hours as needed for severe pain. 20 tablet 0  . Oxycodone HCl 10 MG TABS Take 10 mg by mouth 3 (three) times daily. Reported on 09/05/2015  0  . sucroferric oxyhydroxide (VELPHORO) 500 MG chewable  tablet Chew 500-1,000 mg by mouth See admin instructions. Take 2 tablets (1000 mg) with meals and 1 tablet (500 mg) with snacks    . warfarin (COUMADIN) 7.5 MG tablet Take 7.5 mg by mouth daily.     No current facility-administered medications for this visit.    REVIEW OF SYSTEMS:  [X]  denotes positive finding, [ ]  denotes negative finding Cardiac  Comments:  Chest pain or chest pressure:    Shortness of breath upon exertion:    Short of breath when lying flat:    Irregular heart rhythm:    Constitutional    Fever or chills:      PHYSICAL EXAM: Vitals:   12/04/15 0559  BP: (!) 152/77  Pulse: 84  Resp: 18  Temp: 99.3 F (37.4 C)        Filed Vitals:   10/03/15 1147  BP: 124/78  Pulse: 101  Temp: 98.1 F (36.7 C)  TempSrc: Oral  Resp: 18  Height: 6' (1.829 m)  Weight: 317 lb 4.8 oz (143.926 kg)  SpO2: 98%    GENERAL:  The patient is a well-nourished male, in no acute distress. The vital signs are documented above. CARDIOVASCULAR: There is a regular rate and rhythm. PULMONARY: There is good air exchange bilaterally without wheezing or rales.  MEDICAL ISSUES:  END-STAGE RENAL DISEASE: The patient has a functioning tunneled dialysis catheter. He is not a candidate for upper arm access at this point. 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 He will require placement of a thigh graft which currently he is not willing to proceed with. We had a long discussion about nutrition today and I think he is very motivated to achieve the weight he will need to be considered for kidney transplant. From my standpoint he does not need to be on Coumadin as this was being used to maintain graft patency.  [name] [facility] [place] [phone]

## 2015-12-04 NOTE — Anesthesia Preprocedure Evaluation (Addendum)
Anesthesia Evaluation  Patient identified by MRN, date of birth, ID band  Reviewed: Allergy & Precautions, H&P , NPO status , Patient's Chart, lab work & pertinent test results  History of Anesthesia Complications Negative for: history of anesthetic complications  Airway Mallampati: II  TM Distance: >3 FB Neck ROM: full    Dental  (+) Teeth Intact, Dental Advidsory Given   Pulmonary asthma , sleep apnea , Current Smoker,    breath sounds clear to auscultation       Cardiovascular hypertension, Pt. on medications  Rhythm:regular     Neuro/Psych  Headaches,    GI/Hepatic negative GI ROS,   Endo/Other  Morbid obesity  Renal/GU Dialysis and ESRFRenal disease     Musculoskeletal  (+) Arthritis ,   Abdominal   Peds  Hematology   Anesthesia Other Findings   Reproductive/Obstetrics                           Anesthesia Physical Anesthesia Plan  ASA: III  Anesthesia Plan: General   Post-op Pain Management:    Induction: Intravenous  Airway Management Planned: LMA  Additional Equipment: None  Intra-op Plan:   Post-operative Plan: Extubation in OR  Informed Consent: I have reviewed the patients History and Physical, chart, labs and discussed the procedure including the risks, benefits and alternatives for the proposed anesthesia with the patient or authorized representative who has indicated his/her understanding and acceptance.   Dental Advisory Given  Plan Discussed with: Anesthesiologist, CRNA and Surgeon  Anesthesia Plan Comments:        Anesthesia Quick Evaluation

## 2015-12-04 NOTE — Anesthesia Procedure Notes (Signed)
Procedure Name: LMA Insertion Date/Time: 12/04/2015 7:41 AM Performed by: Carmela RimaMARTINELLI, Yena Tisby F Pre-anesthesia Checklist: Timeout performed, Patient being monitored, Suction available, Emergency Drugs available and Patient identified Patient Re-evaluated:Patient Re-evaluated prior to inductionOxygen Delivery Method: Circle system utilized Preoxygenation: Pre-oxygenation with 100% oxygen Intubation Type: IV induction Ventilation: Mask ventilation without difficulty LMA: LMA inserted LMA Size: 5.0 Tube type: Oral Number of attempts: 1 Placement Confirmation: breath sounds checked- equal and bilateral and positive ETCO2 Dental Injury: Teeth and Oropharynx as per pre-operative assessment

## 2015-12-04 NOTE — Op Note (Signed)
[name]: Bradley [name]   MRN: [id number] DOB: [birth year]    DATE OF OPERATION: 12/04/2015  PREOP DIAGNOSIS: Stage V chronic kidney disease  POSTOP DIAGNOSIS: Same  PROCEDURE:  1. Placement of left  IJ 28 cm tunneled dialysis catheter 2. New right thigh AV graft (4-7 mm PTFE graft)  SURGEON: Di Kindlehristopher S. Edilia Boickson, MD, FACS  ASSIST: Lianne CureMaureen Collins PA  ANESTHESIA: Gen.   EBL: minimal  INDICATIONS: Bradley [name] is a 40 y.o. male who presents for new access. He's had multiple thrombectomies and revisions of his left upper arm graft with stents in his subclavian artery. He has no further options in the left arm. 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 He had no right IJ by ultrasound.  His left IJ catheter was not working well and therefore this was exchanged.Marland Kitchen.  FINDINGS: Excellent thrill in right thigh AV graftat the completion of the procedure.  TECHNIQUE: The patient was taken to the operating room and received a general anesthetic. I looked at the right neck with an ultrasound in the right IJ was not patent. I therefore elected to exchange the left-sided catheter over a wire. The neck and upper chest were prepped and draped in usual sterile fashion.An incision was made over the entrance into the left IJ and the catheter here was dissected free and clamped. It was divided. The distal catheter was removed. The tract over the wire was dilated and the vein dilator and peel-away sheath were advanced over the wire. The 28 cm catheter was advanced to the peel-away sheath and positioned in the right atrium. The peel-away sheath was removed. The exit site for the catheter was selected and the catheter brought through the tunnel and cut the appropriate length. The distal ports were attached. Both ports withdrew easily. The catheter was filled with heparinized saline. The catheter was secured at its exit site with a 3-0 nylon suture. The IJ cannulation  site was closed with a 40 subcutaneous stitch. A sterile dressing was applied.  Attention was then turned to the right thigh. The right thigh was prepped and draped in usual sterile fashion. An oblique incision was made in the right groin. The dissection was carried down to the saphenofemoral junction.  The saphenous vein here was dissected free and controlled vessel. The common femoral artery was dissected free through the same incision and controlled with a vessel loop. Using one distal counterincision, a 4-7 mm PTFE graft was then tunneled in a loop fashion in the right thigh with the arterial aspect of the graft along the lateral aspect of the thigh. The patient received 10,000 units of IV heparin. The common femoral artery was clamped proximally and distally and a longitudinal arteriotomy was made. A segment of the 4 mm end of the graft was excised, the graft slightly spatulated, and sewn end-to-side to the common femoral artery using continuous 6-0 Prolene suture. The grafts and then the appropriate length for anastomosis to the saphenous vein. The saphenous vein was ligated distally. It was spatulated proximally. The graft was cut the appropriate length and spatulated and sewn into into the vein using continuous 6-0 Prolene suture. At the completion was an excellent thrill in the fistula. The distal counterincision was closed with 3-0 Vicryl and the skin closed with 4-0 Vicryl. Hemostasis was obtained in the groin incision. This was closed with a deeper of 2-0 Vicryl. The subcutaneous layer was closed with 3-0 Vicryl. The skin was  closed with 4-0 Vicryl. A sterile dressing was applied. The patient tolerated the procedure well transferred to the recovery room in stable condition. All needle and sponge counts were correct.   Bradley Ferrari, MD, FACS [facility]  DATE OF DICTATION:   12/04/2015

## 2015-12-04 NOTE — Progress Notes (Signed)
Patient arrived from PACU, vital signs obtained and telemetry applied and CCMD made aware. Will monitor patient. Jenica Costilow, Randall AnKristin Jessup RN

## 2015-12-05 ENCOUNTER — Encounter (HOSPITAL_COMMUNITY): Payer: Self-pay | Admitting: Vascular Surgery

## 2015-12-05 DIAGNOSIS — I120 Hypertensive chronic kidney disease with stage 5 chronic kidney disease or end stage renal disease: Secondary | ICD-10-CM | POA: Diagnosis not present

## 2015-12-05 NOTE — Progress Notes (Addendum)
[facility] [place]  Subjective  - Doing well over all.     Objective (!) 151/81 80 97.7 F (36.5 C) (Oral) 18 100%  Intake/Output Summary (Last 24 hours) at 12/05/15 0732 Last data filed at 12/04/15 1600  Gross per 24 hour  Intake          1311.67 ml  Output               50 ml  Net          1261.67 ml    Right thigh dressings clean and dry, thigh soft Left IJ catheter site clean and dry  Assessment/Planning: POD # 1 Tunneled catheter place left side and Right thigh av graft  Discharge to home f/u prn May stick right graft in 4 weeks    Clinton GallantCOLLINS, EMMA [facility] 12/05/2015 7:32 AM --  Laboratory Lab Results:  Recent Labs  12/04/15 0655 12/04/15 1303  WBC  --  7.2  HGB 15.0 15.2  HCT 44.0 46.8  PLT  --  145*   BMET  Recent Labs  12/04/15 0655 12/04/15 1303  NA 140  --   K 6.1*  --   GLUCOSE 90  --   CREATININE  --  12.37*    COAG Lab Results  Component Value Date   INR 1.04 12/04/2015   INR 1.15 10/01/2015   INR 2.47 (H) 05/22/2015   No results found for: PTT  I have interviewed the patient and examined the patient. I agree with the findings by the PA.  Cari Carawayhris Zarin Knupp, MD [phone]

## 2015-12-05 NOTE — Progress Notes (Signed)
Patient given discharge instructions, medication list and follow up appointments, patient IV and tele Dcd. Will discharge home as ordered. Patient ambulated in hallway prior to discharge , stating leg was in "a little bit of pain but he was ok", able to ambulate to nursing station and back x2 and sit in wheel chair. Will discharge home.Ishita Mcnerney, Randall AnKristin Jessup RN

## 2015-12-05 NOTE — Discharge Summary (Signed)
[facility] Discharge Summary   Patient ID:  Bradley [name] MRN: [id number] DOB/AGE: 02-May-1975 40 y.o.  Admit date: 12/04/2015 Discharge date: 12/05/2015 Date of Surgery: 12/04/2015 Surgeon: Surgeon(s): Chuck Hint, MD  Admission Diagnosis: Chronic kidney Disease  Discharge Diagnoses:  Chronic kidney Disease  Secondary Diagnoses: Past Medical History:  Diagnosis Date  . Arthritis   . Asthma    Hx: of as a child  . Back pain    Hx: of back injury  . Family history of adverse reaction to anesthesia    Mother- awoke during leg amputation, per mother  . Headache(784.0)   . Hypertension   . Renal disorder    ESRD  . Sleep apnea    does not wear CPAP    Procedure(s): INSERTION OF ARTERIOVENOUS (AV) GORE-TEX GRAFT THIGH EXCHANGE OF A DIALYSIS CATHETER  Discharged Condition: good  HPI: Bradley [name] a 40 y.o.malewho has had a challenging access history. He had a basilic vein transposition in the left arm and subsequent a left forearm graft. He had multiple interventional procedures and has stents which extended up to his subclavian vein on the left.  On the right arm he had a forearm graft placed which occluded. 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 Therefore he is not a candidate for placement of access in his right arm.  He comes in to discuss placing a thigh graft. He feels strongly that he is close to getting to the weight that he needs to be to be considered for kidney transplant. Therefore currently he does not want to proceed with placement of the thigh graft. He has a functioning tunneled dialysis catheter in the left IJ.   Hospital Course:  Bradley [name] is a 40 y.o. male is S/P Left Procedure(s): INSERTION OF ARTERIOVENOUS (AV) GORE-TEX GRAFT THIGH EXCHANGE OF A DIALYSIS CATHETER Bradley [name] is a 40 y.o. male who presents for new access.  He's had multiple thrombectomies and revisions of his left upper arm graft with stents in his subclavian artery. He has no further options in the left arm. 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 He had no right IJ by ultrasound.  His left IJ catheter was not working well and therefore this was exchanged.  Right thigh dressings clean and dry, thigh soft Left IJ catheter site clean and dry  Assessment/Planning: POD # 1 Tunneled catheter place left side and Right thigh av graft  Discharge to home f/u prn May stick right graft in 4 weeks   Consults:  Treatment Team:  Annie Sable, MD  Significant Diagnostic Studies: CBC Lab Results  Component Value Date   WBC 7.2 12/04/2015   HGB 15.2 12/04/2015   HCT 46.8 12/04/2015   MCV 92.7 12/04/2015   PLT 145 (L) 12/04/2015    BMET    Component Value Date/Time   NA 140 12/04/2015 0655   K 6.1 (H) 12/04/2015 0655   CL 101 10/01/2015 0848   CO2 28 05/22/2015 0928   GLUCOSE 90 12/04/2015 0655   BUN 58 (H) 10/01/2015 0848   CREATININE 12.37 (H) 12/04/2015 1303   CALCIUM 9.1 05/22/2015 0928   GFRNONAA 4 (L) 12/04/2015 1303   GFRAA 5 (L) 12/04/2015 1303   COAG Lab Results  Component Value Date   INR 1.04 12/04/2015   INR 1.15 10/01/2015   INR  2.47 (H) 05/22/2015     Disposition:  Discharge to :Home Discharge Instructions    Call MD for:  redness, tenderness, or signs of infection (pain, swelling, bleeding, redness, odor or green/yellow discharge around incision site)    Complete by:  As directed    Call MD for:  severe or increased pain, loss or decreased feeling  in affected limb(s)    Complete by:  As directed    Call MD for:  temperature >100.5    Complete by:  As directed    Discharge instructions    Complete by:  As directed    You may wash your right thigh 48 hours after surgery.   Discharge patient    Complete by:  As directed    Discharge pt to home    Driving Restrictions    Complete by:  As directed    No driving for 24 hours   Increase activity slowly    Complete by:  As directed    Walk with assistance use walker or cane as needed   Resume previous diet    Complete by:  As directed        Medication List    TAKE these medications   amLODipine 10 MG tablet Commonly known as:  NORVASC Take 10 mg by mouth daily.   calcium acetate 667 MG capsule Commonly known as:  PHOSLO Take 1,334 mg by mouth 5 (five) times daily. With meals and with snacks   cinacalcet 90 MG tablet Commonly known as:  SENSIPAR Take 90 mg by mouth 2 (two) times daily.   loperamide 2 MG capsule Commonly known as:  IMODIUM Take 2 capsules (4 mg total) by mouth 4 (four) times daily as needed for diarrhea or loose stools.   Oxycodone HCl 10 MG Tabs Take 10 mg by mouth daily as needed (for pain). Reported on 09/05/2015   oxyCODONE 5 MG immediate release tablet Commonly known as:  Oxy IR/ROXICODONE Take 1 tablet (5 mg total) by mouth every 6 (six) hours as needed for severe pain.   VELPHORO 500 MG chewable tablet Generic drug:  sucroferric oxyhydroxide Chew 500-1,000 mg by mouth See admin instructions. Take 2 tablets (1000 mg) with meals and 1 tablet (500 mg) with snacks   warfarin 7.5 MG tablet Commonly known as:  COUMADIN Take 7.5 mg by mouth daily.      Verbal and written Discharge instructions given to the patient. Wound care per Discharge AVS   Signed: Clinton GallantCOLLINS, EMMA [facility] 12/05/2015, 10:01 AM

## 2015-12-05 NOTE — Anesthesia Postprocedure Evaluation (Signed)
Anesthesia Post Note  Patient: Bradley [name]  Procedure(s) Performed: Procedure(s) (LRB): INSERTION OF ARTERIOVENOUS (AV) GORE-TEX GRAFT THIGH (Right) EXCHANGE OF A DIALYSIS CATHETER (Left)  Patient location during evaluation: PACU Anesthesia Type: General Level of consciousness: awake Pain management: pain level controlled Vital Signs Assessment: post-procedure vital signs reviewed and stable Respiratory status: spontaneous breathing Cardiovascular status: stable Postop Assessment: no signs of nausea or vomiting Anesthetic complications: no    Last Vitals:  Vitals:   12/04/15 1930 12/05/15 0615  BP: 140/72 (!) 151/81  Pulse: 88 80  Resp: 18 18  Temp: 36.7 C 36.5 C    Last Pain:  Vitals:   12/05/15 0826  TempSrc:   PainSc: 0-No pain                 Tinamarie Przybylski

## 2015-12-07 ENCOUNTER — Other Ambulatory Visit: Payer: Self-pay | Admitting: Physician Assistant

## 2015-12-07 ENCOUNTER — Telehealth: Payer: Self-pay

## 2015-12-07 ENCOUNTER — Encounter: Payer: Self-pay | Admitting: Vascular Surgery

## 2015-12-07 ENCOUNTER — Ambulatory Visit (INDEPENDENT_AMBULATORY_CARE_PROVIDER_SITE_OTHER): Payer: Medicare Other | Admitting: Vascular Surgery

## 2015-12-07 VITALS — BP 87/52 | HR 112 | Temp 98.3°F | Ht 72.0 in | Wt 311.7 lb

## 2015-12-07 DIAGNOSIS — N186 End stage renal disease: Secondary | ICD-10-CM

## 2015-12-07 DIAGNOSIS — Z992 Dependence on renal dialysis: Secondary | ICD-10-CM

## 2015-12-07 MED ORDER — OXYCODONE HCL 5 MG PO TABS
5.0000 mg | ORAL_TABLET | Freq: Four times a day (QID) | ORAL | 0 refills | Status: DC | PRN
Start: 2015-12-07 — End: 2016-09-10

## 2015-12-07 NOTE — Telephone Encounter (Signed)
Appt scheduled for today 12/07/15 at 3:15pm for PA to see/awt

## 2015-12-07 NOTE — Telephone Encounter (Signed)
Phone call returned to pt. in response to call from [facility], with [facility] @ 7:19 PM 9/14, to the Answering Service.  The message was to report pt.'s right thigh pain was 8/10, and right thigh had swelling, warmth, pink, and was tender to touch.  Pt. Stated at this time he was in Hemodialysis, and was instructed by Dr. Edilia Boickson to come by the office today and have the right thigh checked.  Appt. Given for 3:00 PM to have PA examine the surgical site.  Pt. Stated he will have the HD treatment stopped early to arrive by about 3:15 PM.

## 2015-12-07 NOTE — Progress Notes (Signed)
POST OPERATIVE OFFICE NOTE    CC:  F/u for surgery  HPI:  This is a 40 y.o. male who is s/p 1. Placement of left  IJ 28 cm tunneled dialysis catheter and 2. New right thigh AV graft (4-7 mm PTFE graft) on 12/04/15.  He states he is having pain in his right groin.  He states that it hasn't gotten any worse since surgery, but hasn't gotten any better either.  He states he has some swelling around the graft.  He states that Dr. Edilia Bo spoke with the nurse yesterday and he wanted him to come in and have graft evaluated.  He is not having any pain in is leg or foot.   He denies any fevers.  He is eating well.  Didn't feel as well today as he has had 4 dialysis treatments this week.  He is back on his coumadin and his INR is checked at the dialysis center.   Allergies  Allergen Reactions  . Penicillins Anaphylaxis and Other (See Comments)    Has patient had a PCN reaction causing immediate rash, facial/tongue/throat swelling, SOB or lightheadedness with hypotension: Yes Has patient had a PCN reaction causing severe rash involving mucus membranes or skin necrosis: Yes Has patient had a PCN reaction that required hospitalization Yes Has patient had a PCN reaction occurring within the last 10 years: Unknown If all of the above answers are "NO", then may proceed with Cephalosporin use.  . Wellbutrin [Bupropion] Rash    Current Outpatient Prescriptions  Medication Sig Dispense Refill  . amLODipine (NORVASC) 10 MG tablet Take 10 mg by mouth daily.  0  . calcium acetate (PHOSLO) 667 MG capsule Take 1,334 mg by mouth 5 (five) times daily. With meals and with snacks  0  . cinacalcet (SENSIPAR) 90 MG tablet Take 90 mg by mouth 2 (two) times daily.     Marland Kitchen loperamide (IMODIUM) 2 MG capsule Take 2 capsules (4 mg total) by mouth 4 (four) times daily as needed for diarrhea or loose stools. 20 capsule 1  . Oxycodone HCl 10 MG TABS Take 10 mg by mouth daily as needed (for pain). Reported on 09/05/2015  0  .  sucroferric oxyhydroxide (VELPHORO) 500 MG chewable tablet Chew 500-1,000 mg by mouth See admin instructions. Take 2 tablets (1000 mg) with meals and 1 tablet (500 mg) with snacks    . warfarin (COUMADIN) 7.5 MG tablet Take 7.5 mg by mouth daily.    Marland Kitchen oxyCODONE (OXY IR/ROXICODONE) 5 MG immediate release tablet Take 1 tablet (5 mg total) by mouth every 6 (six) hours as needed for severe pain. (Patient not taking: Reported on 12/07/2015) 20 tablet 0  . oxyCODONE (ROXICODONE) 5 MG immediate release tablet Take 1 tablet (5 mg total) by mouth every 6 (six) hours as needed for severe pain. 6 tablet 0   No current facility-administered medications for this visit.      ROS:  See HPI  Physical Exam:  Vitals:   12/07/15 1536  BP: (!) 87/52  Pulse: (!) 112  Temp: 98.3 F (36.8 C)    Incision:  Healing nicely. Extremities:  Unable to palpate graft, but there is a thrill present.  There is mild swelling around the graft. No erythema present.    Assessment/Plan:  This is a 40 y.o. male who is s/p: 1. Placement of left  IJ 28 cm tunneled dialysis catheter 2. New right thigh AV graft (4-7 mm PTFE graft)  -pt's incisions are clean and  dry-went over wound care instructions with pt to wash with soap and water, but then keep incisions clean and dry and place gauze or washcloth in groin while sitting to wick moisture to help prevent wound infection. -no evidence of infection and he has been afebrile.  He does have some edema around the graft, but this is expected due to tunneling.  -pt gets Oxycodone from PCP.  He did not receive an Rx at time of surgery b/c he had  Some left.  He states he has 4 pills left and has appt with PCP Monday at 4pm.  He was given an Rx for Roxicodone 5mg  one q6h prn pain #6(six) no refill.   Doreatha MassedSamantha Rhyne, PA-C [facility] [phone]  Clinic MD:  Pt seen and examined with Dr. Randie Heinzain  I have seen and evaluated patient with Eppie GibsonSamantha Rhye and agree with  assessment and plan above.   Dailee Manalang C. Randie Heinzain, MD [facility] [place] Office: [phone] Pager: [phone]

## 2015-12-14 ENCOUNTER — Encounter: Payer: Self-pay | Admitting: Vascular Surgery

## 2015-12-19 ENCOUNTER — Ambulatory Visit: Payer: Medicare Other | Admitting: Vascular Surgery

## 2016-01-22 ENCOUNTER — Telehealth: Payer: Self-pay

## 2016-01-22 NOTE — Telephone Encounter (Signed)
[facility] Nurse Lowella Bandyikki called to report that patient has declined need for their services and stated that he is doing well.

## 2016-09-10 ENCOUNTER — Encounter (HOSPITAL_COMMUNITY): Payer: Self-pay

## 2016-09-10 ENCOUNTER — Emergency Department (HOSPITAL_COMMUNITY): Payer: Medicare Other

## 2016-09-10 ENCOUNTER — Emergency Department (HOSPITAL_COMMUNITY)
Admission: EM | Admit: 2016-09-10 | Discharge: 2016-09-10 | Disposition: A | Discharge status: Home / Self Care | Payer: Medicare Other | Attending: Emergency Medicine | Admitting: Emergency Medicine

## 2016-09-10 DIAGNOSIS — Y999 Unspecified external cause status: Secondary | ICD-10-CM | POA: Insufficient documentation

## 2016-09-10 DIAGNOSIS — Y929 Unspecified place or not applicable: Secondary | ICD-10-CM | POA: Diagnosis not present

## 2016-09-10 DIAGNOSIS — W010XXA Fall on same level from slipping, tripping and stumbling without subsequent striking against object, initial encounter: Secondary | ICD-10-CM | POA: Insufficient documentation

## 2016-09-10 DIAGNOSIS — F17210 Nicotine dependence, cigarettes, uncomplicated: Secondary | ICD-10-CM | POA: Insufficient documentation

## 2016-09-10 DIAGNOSIS — I10 Essential (primary) hypertension: Secondary | ICD-10-CM | POA: Diagnosis not present

## 2016-09-10 DIAGNOSIS — S83401A Sprain of unspecified collateral ligament of right knee, initial encounter: Secondary | ICD-10-CM | POA: Diagnosis not present

## 2016-09-10 DIAGNOSIS — S8991XA Unspecified injury of right lower leg, initial encounter: Secondary | ICD-10-CM | POA: Diagnosis present

## 2016-09-10 DIAGNOSIS — J45909 Unspecified asthma, uncomplicated: Secondary | ICD-10-CM | POA: Insufficient documentation

## 2016-09-10 DIAGNOSIS — Z79899 Other long term (current) drug therapy: Secondary | ICD-10-CM | POA: Insufficient documentation

## 2016-09-10 DIAGNOSIS — Y9389 Activity, other specified: Secondary | ICD-10-CM | POA: Diagnosis not present

## 2016-09-10 MED ORDER — TRAMADOL HCL 50 MG PO TABS: 50.0000 mg | ORAL_TABLET | Freq: Four times a day (QID) | ORAL | 0 refills | Status: AC | PRN

## 2016-09-10 MED ORDER — CYCLOBENZAPRINE HCL 10 MG PO TABS: 10.0000 mg | ORAL_TABLET | Freq: Two times a day (BID) | ORAL | 0 refills | Status: DC | PRN

## 2016-09-10 NOTE — Discharge Instructions (Signed)
You have injured your right knee, it is likely a sprain.  Keep ACE wrap for support, elevate it when resting.  Follow up with orthopedist if your condition worsen or if you have other concerns

## 2016-09-10 NOTE — ED Provider Notes (Signed)
[facility]-EMERGENCY DEPT Provider Note   CSN: [id number] Arrival date & time: 09/10/16  1213  By signing my name below, I, Cynda Acres, attest that this documentation has been prepared under the direction and in the presence of Fayrene Helper, PA-C. Electronically Signed: Cynda Acres, Scribe. 09/10/16. 1:12 PM.  History   Chief Complaint Chief Complaint  Patient presents with  . Knee Injury    HPI Comments: Bradley [name] is a 41 y.o. male with a history of hypertension and ESRD, who presents to the Emergency Department complaining of sudden-onset, constant right knee pain s/p injury that occurred yesterday. Patient states he was in the pool yesterday when he was holding his daughter and he slipped. He was attempting to hold his daughter above the water when he injured his right knee. Patient reports knee problems in the past. Patient describes his pain as an moderate aching, stabbing, and pressured. Patient is noted to be a kidney transplant patient. Patient reports associated swelling. Patient reports applying ice with no relief. No medication taken prior to arrival. Patient states his pain is worse with standing and sitting. Patient is allergic to penicillin and Wellbutrin. Patient is ambulatory in the emergency department with a cane. Patient denies any numbness of the bilateral lower extremities, weakness, tingling, hip pain, ankle pain, fever, or chills.   The history is provided by the patient. No language interpreter was used.    Past Medical History:  Diagnosis Date  . Arthritis   . Asthma    Hx: of as a child  . Back pain    Hx: of back injury  . Family history of adverse reaction to anesthesia    Mother- awoke during leg amputation, per mother  . Headache(784.0)   . Hypertension   . Renal disorder    ESRD  . Sleep apnea    does not wear CPAP    Patient Active Problem List   Diagnosis Date Noted  . ESRD (end stage renal disease) (HCC) 12/04/2015  . Dialysis AV fistula  malfunction (HCC)   . Aftercare following surgery of the circulatory system, NEC 09/08/2012  . End stage renal disease (HCC) 09/08/2012    Past Surgical History:  Procedure Laterality Date  . APPENDECTOMY    . AV FISTULA PLACEMENT Left 09/20/2014   Procedure: INSERTION OF 4-7MM X 45CM STRETCH ARTERIOVENOUS (AV) GORE-TEX GRAFT, LEFT FOREARM, BRACHIAL ARTERY TO BRACHIAL VEIN;  Surgeon: Pryor Ochoa, MD;  Location: [facility] OR;  Service: Vascular;  Laterality: Left;  . AV FISTULA PLACEMENT Right 09/13/2015   Procedure: INSERTION OF ARTERIOVENOUS (AV) GORE-TEX GRAFT ARM-RIGHT UPPER ARM; IMMEDIATE STICK GRAFT;  Surgeon: Chuck Hint, MD;  Location: [facility] OR;  Service: Vascular;  Laterality: Right;  . AV FISTULA PLACEMENT Right 12/04/2015   Procedure: INSERTION OF ARTERIOVENOUS (AV) GORE-TEX GRAFT THIGH;  Surgeon: Chuck Hint, MD;  Location: [facility] OR;  Service: Vascular;  Laterality: Right;  . AVF   Left    Undone  . AVF inserted Left    upper  . BASCILIC VEIN TRANSPOSITION Right 07/22/2012   Procedure: BASCILIC VEIN TRANSPOSITION;  Surgeon: Chuck Hint, MD;  Location: [facility] OR;  Service: Vascular;  Laterality: Right;  . BASCILIC VEIN TRANSPOSITION Left 09/05/2014   Procedure: BASCILIC VEIN TRANSPOSITION-LEFT;  Surgeon: Sherren Kerns, MD;  Location: [facility] OR;  Service: Vascular;  Laterality: Left;  . DG AV DIALYSIS  SHUNT ACCESS EXIST*L* OR     left- 07/2014  . EXCHANGE OF A DIALYSIS CATHETER Left  12/04/2015   Procedure: EXCHANGE OF A DIALYSIS CATHETER;  Surgeon: [name], MD;  [facility];  Service: Vascular;  Laterality: Left;  . LIGATION OF ARTERIOVENOUS  FISTULA Left 09/20/2014   Procedure: LIGATION OF ARTERIOVENOUS  FISTULA, LEFT UPPER ARM;  Surgeon: Pryor OchoaJames D Lawson, MD;  [facility];  Service: Vascular;  Laterality: Left;  . PERIPHERAL VASCULAR CATHETERIZATION Right 10/01/2015   Procedure: Upper Extremity Venography;  Surgeon: [name], MD;   [facility];  Service: Cardiovascular;  Laterality: Right;  . THROMBECTOMY W/ EMBOLECTOMY Left 09/20/2014   Procedure: ATTEMPTED THROMBECTOMY LEFT BASILIC VEIN TRANSPOSITION ;  Surgeon: Pryor OchoaJames D Lawson, MD;  [facility];  Service: Vascular;  Laterality: Left;       Home Medications    Prior to Admission medications   Medication Sig Start Date End Date Taking? Authorizing Provider  amLODipine (NORVASC) 10 MG tablet Take 10 mg by mouth daily. 08/05/14   [provider]  calcium acetate (PHOSLO) 667 MG capsule Take 1,334 mg by mouth 5 (five) times daily. With meals and with snacks 09/15/14   [provider]  cinacalcet (SENSIPAR) 90 MG tablet Take 90 mg by mouth 2 (two) times daily.     [provider]  loperamide (IMODIUM) 2 MG capsule Take 2 capsules (4 mg total) by mouth 4 (four) times daily as needed for diarrhea or loose stools. 04/23/15   Linna HoffKindl, James D, MD  oxyCODONE (OXY IR/ROXICODONE) 5 MG immediate release tablet Take 1 tablet (5 mg total) by mouth every 6 (six) hours as needed for severe pain. Patient not taking: Reported on 12/07/2015 09/13/15   Maris Bergerrinh, Kimberly A, PA-C  oxyCODONE (ROXICODONE) 5 MG immediate release tablet Take 1 tablet (5 mg total) by mouth every 6 (six) hours as needed for severe pain. 12/07/15   Rhyne, Ames CoupeSamantha J, PA-C  Oxycodone HCl 10 MG TABS Take 10 mg by mouth daily as needed (for pain). Reported on 09/05/2015 05/16/15   [provider]  sucroferric oxyhydroxide (VELPHORO) 500 MG chewable tablet Chew 500-1,000 mg by mouth See admin instructions. Take 2 tablets (1000 mg) with meals and 1 tablet (500 mg) with snacks    [provider]  warfarin (COUMADIN) 7.5 MG tablet Take 7.5 mg by mouth daily.    [provider]    Family History Family History  Problem Relation Age of Onset  . Hypertension Mother   . Peripheral vascular disease Mother   . Diabetes Father   . Hyperlipidemia Father   .  Hypertension Father   . Peripheral vascular disease Maternal Grandmother     Social History Social History  Substance Use Topics  . Smoking status: Current Some Day Smoker    Packs/day: 0.50    Years: 15.00    Types: Cigarettes  . Smokeless tobacco: Never Used  . Alcohol use 0.6 oz/week    1 Cans of beer per week     Comment: rare     Allergies   Penicillins and Wellbutrin [bupropion]   Review of Systems Review of Systems  Constitutional: Negative for chills and fever.  Musculoskeletal: Positive for arthralgias (right knee) and joint swelling (right knee). Negative for gait problem.  Neurological: Negative for weakness and numbness.     Physical Exam Updated Vital Signs BP 140/75 (BP Location: Right Arm)   Pulse 86   Temp 98.6 F (37 C) (Oral)   Resp 18   SpO2 99%   Physical Exam  Constitutional: He is oriented to person, place, and time. He appears well-developed.  HENT:  Head: Normocephalic and atraumatic.  Mouth/Throat: Oropharynx is clear and moist.  Eyes: Conjunctivae and EOM are normal. Pupils are equal, round, and reactive to light.  Neck: Normal range of motion. Neck supple.  Cardiovascular: Normal rate.   Pulmonary/Chest: Effort normal.  Abdominal: Soft. Bowel sounds are normal.  Musculoskeletal: Normal range of motion. He exhibits edema and tenderness. He exhibits no deformity.  Right knee moderate tenderness is noted to the lateral joint line on palpation with associated selling and decreased flexion and extension secondary to pain. No skin erythema. No obvious joint laxity. Right ankle and hip is normal.   Neurological: He is alert and oriented to person, place, and time.  Skin: Skin is warm and dry.  Psychiatric: He has a normal mood and affect.  Nursing note and vitals reviewed.    ED Treatments / Results  DIAGNOSTIC STUDIES: Oxygen Saturation is 99% on RA, normal by my interpretation.    COORDINATION OF CARE: 1:11 PM Discussed treatment  plan with pt at bedside and pt agreed to plan, which includes tramadol and flexeril.   Labs (all labs ordered are listed, but only abnormal results are displayed) Labs Reviewed - No data to display  EKG  EKG Interpretation None       Radiology Dg Knee Complete 4 Views Right  Result Date: 09/10/2016 CLINICAL DATA:  Right knee pain secondary to a twisting injury yesterday. EXAM: RIGHT KNEE - COMPLETE 4+ VIEW COMPARISON:  None. FINDINGS: There is a moderate joint effusion. There are osteoarthritic changes of the lateral and patellofemoral compartments. No fracture or dislocation. On 1 of the oblique views there is abnormal widening of the medial compartment suggesting injury to the medial collateral ligament. IMPRESSION: Osteoarthritis. Moderate joint effusion. Possible injury to the medial collateral ligament. Electronically Signed   By: Francene Boyers M.D.   On: 09/10/2016 12:58    Procedures Procedures (including critical care time)  Medications Ordered in ED Medications - No data to display   Initial Impression / Assessment and Plan / ED Course  I have reviewed the triage vital signs and the nursing notes.  Pertinent labs & imaging results that were available during my care of the patient were reviewed by me and considered in my medical decision making (see chart for details).     Patient X-Ray negative for obvious fracture or dislocation. Xray did show moderate joint effusion with possible injury to the medial colalteral ligament.  Pt is most tender at the lateral joint line.  Pt also on warfarin, thus increases the chance of hemarthrosis.  Pain managed in ED. ACE wrap.  Pt advised to follow up with orthopedics if symptoms persist for possibility of missed fracture diagnosis. Patient given brace while in ED, conservative therapy recommended and discussed. Patient will be dc home & is agreeable with above plan.  BP 140/75 (BP Location: Right Arm)   Pulse 86   Temp 98.6 F (37 C)  (Oral)   Resp 18   SpO2 99%    Final Clinical Impressions(s) / ED Diagnoses   Final diagnoses:  Sprain of collateral ligament of right knee, initial encounter    New Prescriptions New Prescriptions   CYCLOBENZAPRINE (FLEXERIL) 10 MG TABLET    Take 1 tablet (10 mg total) by mouth 2 (two) times daily as needed for muscle spasms.   TRAMADOL (ULTRAM) 50 MG TABLET    Take 1 tablet (50 mg total)  by mouth every 6 (six) hours as needed.   I personally performed the services described in this documentation, which was scribed in my presence. The recorded information has been reviewed and is accurate.      Fayrene Helper, PA-C 09/10/16 1322    Mancel Bale, MD 09/10/16 1739

## 2016-09-10 NOTE — ED Triage Notes (Signed)
Pt reports twisting his right knee while in the pool yesterday and reports pain to right knee. Ambulatory.

## 2017-01-01 IMAGING — US US RENAL
1 series · 14 of 25 positions shown · non-contrast
Comparison: CT scan of the abdomen pelvis of May 04, 2009

CLINICAL DATA: Polycythemia; dialysis patient.

EXAM:
RENAL / URINARY TRACT ULTRASOUND COMPLETE

[Series 1: us renal · 0.30mm/px · 14 of 39 slices shown]
[im 1/39]
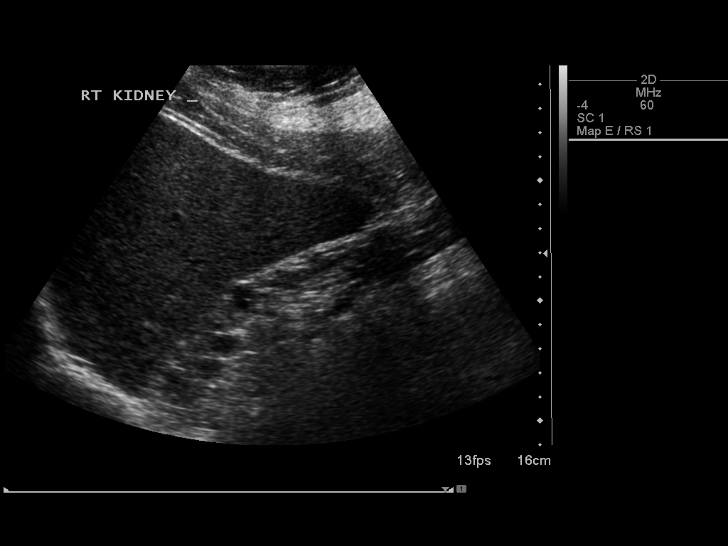
[im 4/39]
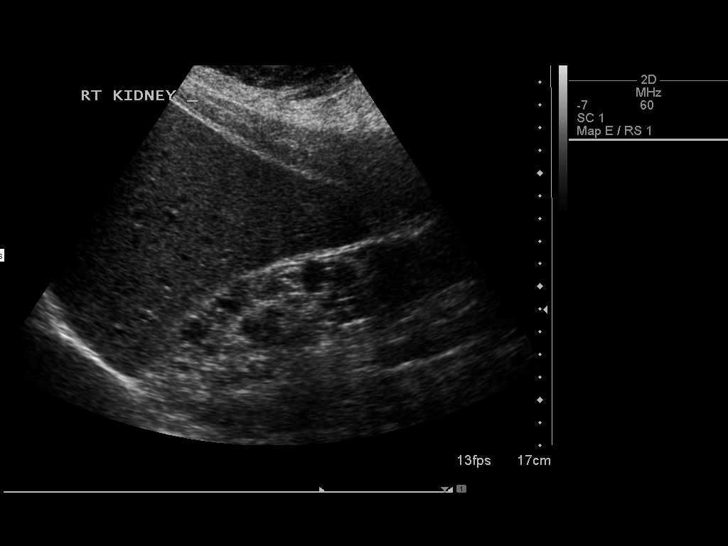
[im 7/39]
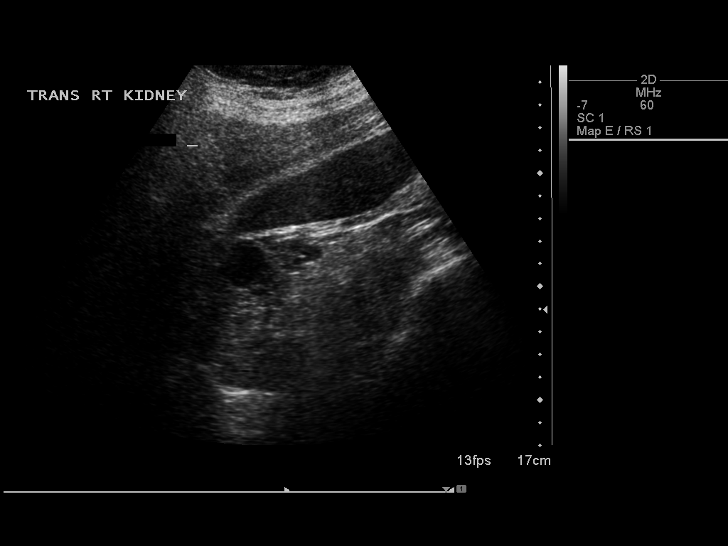
[im 10/39]
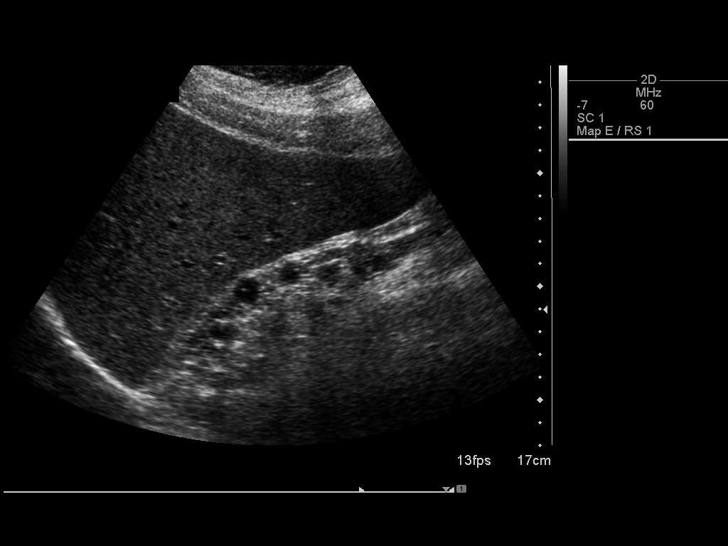
[im 13/39]
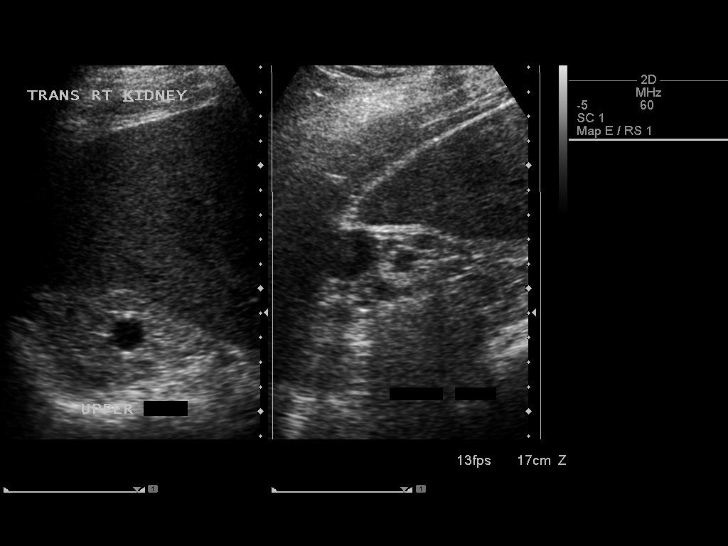
[im 15/39]
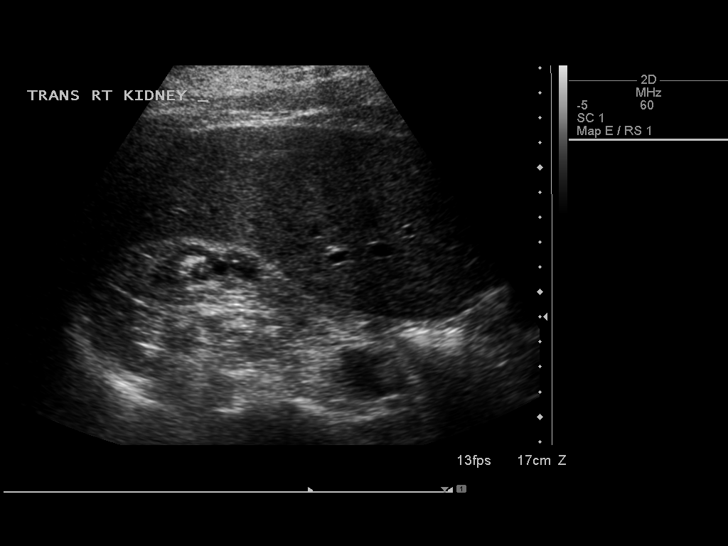
[im 18/39]
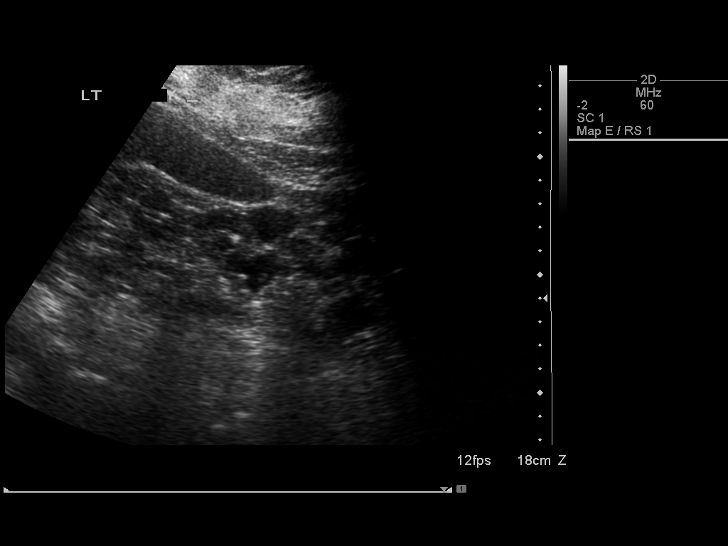
[im 21/39]
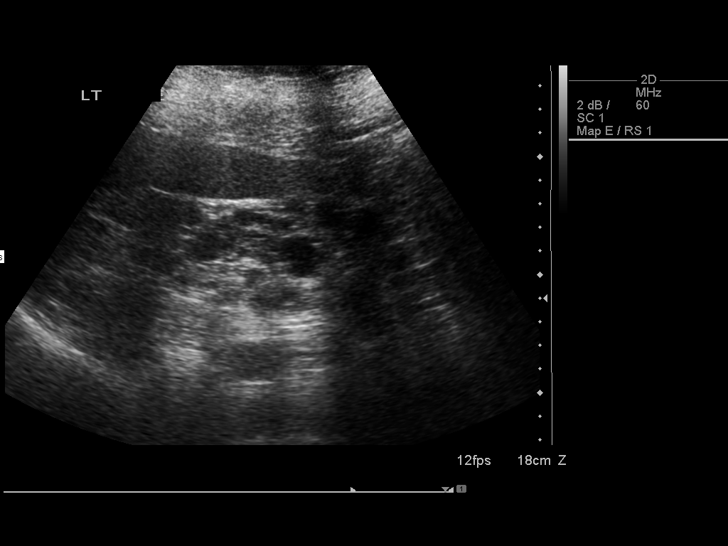
[im 24/39]
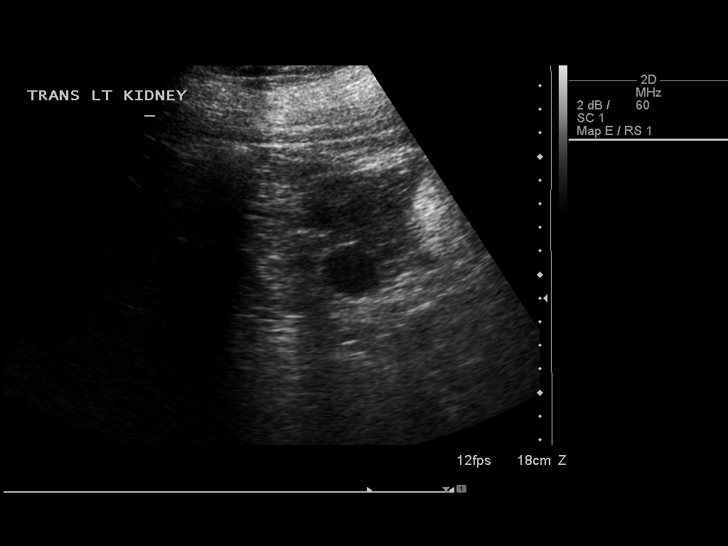
[im 26/39]
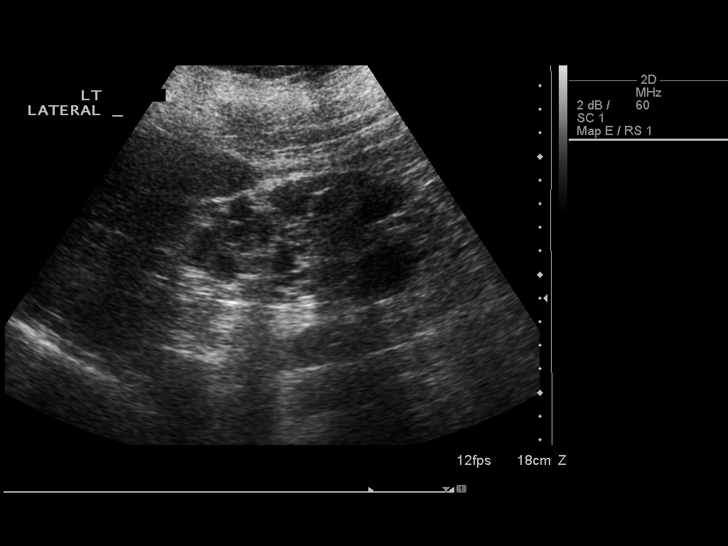
[im 29/39]
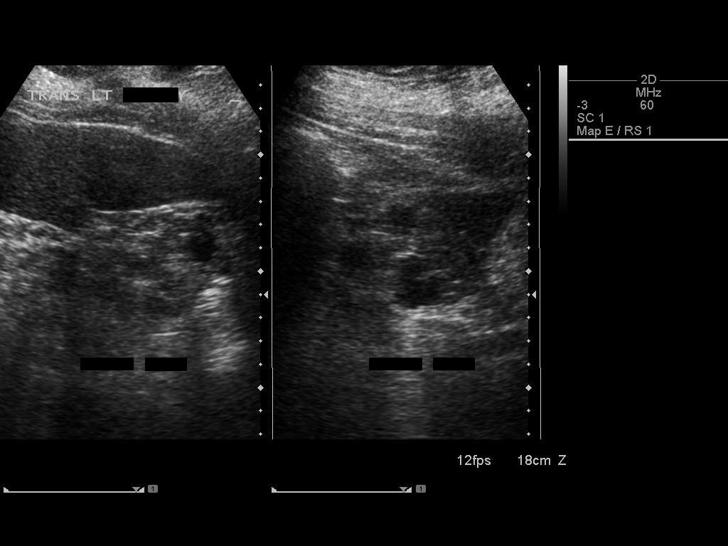
[im 32/39]
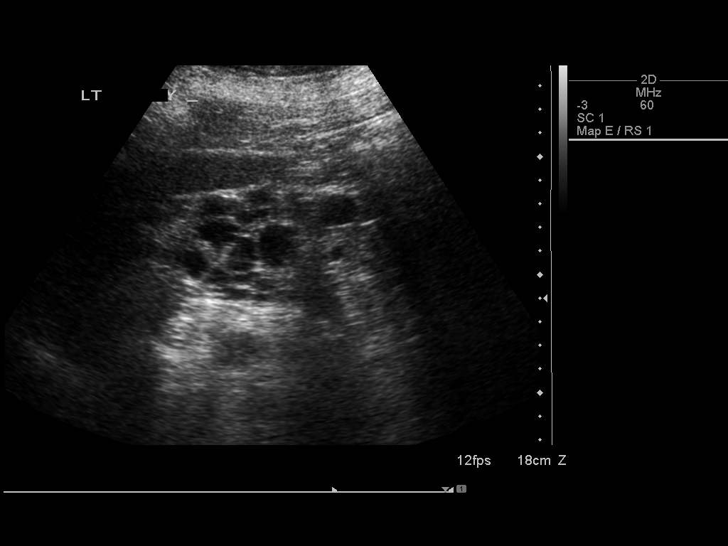
[im 35/39]
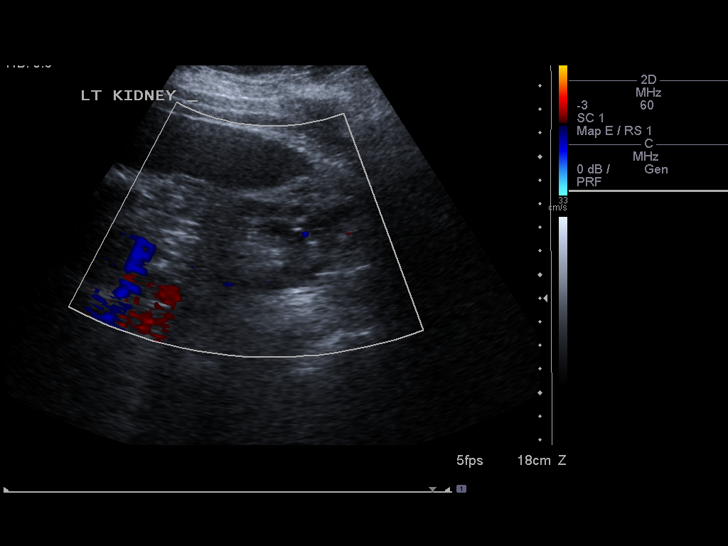
[im 39/39]
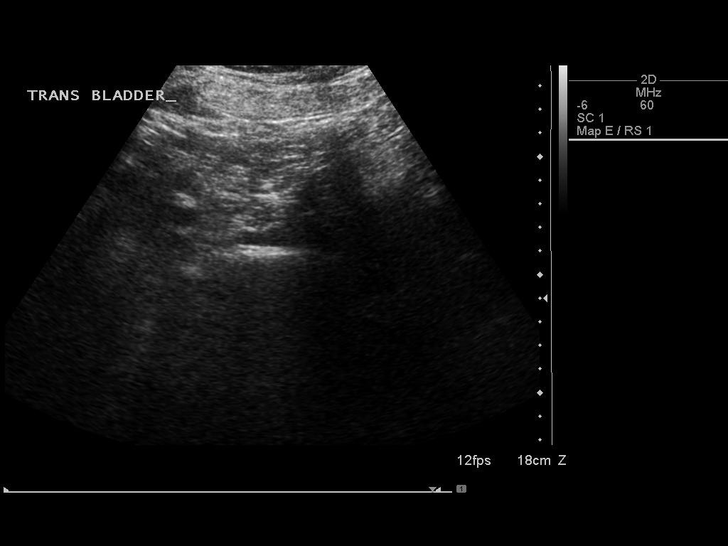

[14 of 25 positions shown; findings below may reference images not displayed]

FINDINGS: Right Kidney:

Length: 10.4 cm. There is diffuse cortical thickening with multiple
cortical cysts measuring as much as 2 cm in diameter. There is
diffuse cortical thinning. There is no hydronephrosis.

Left Kidney:

Length: 10.9 cm. Multiple cysts are present with the largest
measuring 3.3 cm in greatest dimension. There is diffuse cortical
thinning with increased cortical echotexture similar to that on the
right.

Bladder:

The urinary bladder is decompressed.
IMPRESSION: Diffuse cortical thinning with increased echotexture consistent with
medical renal disease. Multiple cysts as described consistent with
the patient's dialysis dependent status. No hydronephrosis.

## 2017-04-16 ENCOUNTER — Emergency Department (HOSPITAL_COMMUNITY): Payer: Medicare Other

## 2017-04-16 ENCOUNTER — Encounter (HOSPITAL_COMMUNITY): Payer: Self-pay | Admitting: *Deleted

## 2017-04-16 ENCOUNTER — Emergency Department (HOSPITAL_COMMUNITY)
Admission: EM | Admit: 2017-04-16 | Discharge: 2017-04-16 | Disposition: A | Discharge status: Home / Self Care | Payer: Medicare Other | Attending: Emergency Medicine | Admitting: Emergency Medicine

## 2017-04-16 ENCOUNTER — Other Ambulatory Visit: Payer: Self-pay

## 2017-04-16 DIAGNOSIS — R1011 Right upper quadrant pain: Secondary | ICD-10-CM | POA: Diagnosis not present

## 2017-04-16 DIAGNOSIS — N186 End stage renal disease: Secondary | ICD-10-CM | POA: Insufficient documentation

## 2017-04-16 DIAGNOSIS — Z79899 Other long term (current) drug therapy: Secondary | ICD-10-CM | POA: Diagnosis not present

## 2017-04-16 DIAGNOSIS — J45909 Unspecified asthma, uncomplicated: Secondary | ICD-10-CM | POA: Insufficient documentation

## 2017-04-16 DIAGNOSIS — Z7901 Long term (current) use of anticoagulants: Secondary | ICD-10-CM | POA: Insufficient documentation

## 2017-04-16 DIAGNOSIS — Z940 Kidney transplant status: Secondary | ICD-10-CM | POA: Insufficient documentation

## 2017-04-16 DIAGNOSIS — I120 Hypertensive chronic kidney disease with stage 5 chronic kidney disease or end stage renal disease: Secondary | ICD-10-CM | POA: Diagnosis not present

## 2017-04-16 DIAGNOSIS — R109 Unspecified abdominal pain: Secondary | ICD-10-CM

## 2017-04-16 DIAGNOSIS — F17200 Nicotine dependence, unspecified, uncomplicated: Secondary | ICD-10-CM | POA: Diagnosis not present

## 2017-04-16 DIAGNOSIS — M546 Pain in thoracic spine: Secondary | ICD-10-CM | POA: Diagnosis present

## 2017-04-16 LAB — CBC
HCT: 42.6 % (ref 39.0–52.0)
HEMOGLOBIN: 13 g/dL (ref 13.0–17.0)
MCH: 23.8 pg — ABNORMAL LOW (ref 26.0–34.0)
MCHC: 30.5 g/dL (ref 30.0–36.0)
MCV: 78 fL (ref 78.0–100.0)
PLATELETS: 215 10*3/uL (ref 150–400)
RBC: 5.46 MIL/uL (ref 4.22–5.81)
RDW: 15.8 % — ABNORMAL HIGH (ref 11.5–15.5)
WBC: 4.5 10*3/uL (ref 4.0–10.5)

## 2017-04-16 LAB — URINALYSIS, ROUTINE W REFLEX MICROSCOPIC
BILIRUBIN URINE: NEGATIVE
Glucose, UA: NEGATIVE mg/dL
Hgb urine dipstick: NEGATIVE
Ketones, ur: NEGATIVE mg/dL
Leukocytes, UA: NEGATIVE
NITRITE: NEGATIVE
PROTEIN: NEGATIVE mg/dL
SPECIFIC GRAVITY, URINE: 1.018 (ref 1.005–1.030)
pH: 7 (ref 5.0–8.0)

## 2017-04-16 LAB — BASIC METABOLIC PANEL
Anion gap: 10 (ref 5–15)
BUN: 15 mg/dL (ref 6–20)
CALCIUM: 8.6 mg/dL — AB (ref 8.9–10.3)
CHLORIDE: 107 mmol/L (ref 101–111)
CO2: 21 mmol/L — ABNORMAL LOW (ref 22–32)
CREATININE: 1.43 mg/dL — AB (ref 0.61–1.24)
Glucose, Bld: 102 mg/dL — ABNORMAL HIGH (ref 65–99)
Potassium: 4.5 mmol/L (ref 3.5–5.1)
SODIUM: 138 mmol/L (ref 135–145)

## 2017-04-16 MED ORDER — CYCLOBENZAPRINE HCL 10 MG PO TABS: 10.0000 mg | ORAL_TABLET | Freq: Two times a day (BID) | ORAL | 0 refills | Status: DC | PRN

## 2017-04-16 NOTE — Discharge Instructions (Signed)
Take muscle relaxant as needed for your discomfort.  Follow up with your doctor for further care.  Return if you develop fever, productive cough, trouble breathing, urinating blood or if you have other concerns.

## 2017-04-16 NOTE — ED Notes (Signed)
Patient verbalized understanding of discharge instructions and denies any further needs or questions at this time. VS stable. Patient ambulatory with steady gait.  

## 2017-04-16 NOTE — ED Provider Notes (Signed)
[facility] EMERGENCY DEPARTMENT Provider Note   CSN: [id number] Arrival date & time: 04/16/17  0807     History   Chief Complaint Chief Complaint  Patient presents with  . Back Pain    HPI Bradley [name] is a 42 y.o. male.  HPI   42 year old male with history of back pain, kidney disease, arthritis, hypertension presenting complaining of back pain. Pt report he noticed pain to his R mid back approximately 2-3 days ago while he was sitting around.  Pain has been intermittent, described as a sharp sensation, non radiating, worsening with movement, coughing or sneezing.  Pain is minimal at this time.  No report of fever, chills, lightheadedness, dizziness, cp, sob, hemoptysis, abd pain, dysuria, hematuria, or rash. Denies any recent injury. He has had kidney transplant.  He denies prior hx of PE/DVT, no recent surgery, prolonged bedrest.  He takes chronic pain medication and sts his medication does help the pain but haven't bring resolution to his sxs.    Past Medical History:  Diagnosis Date  . Arthritis   . Asthma    Hx: of as a child  . Back pain    Hx: of back injury  . Family history of adverse reaction to anesthesia    Mother- awoke during leg amputation, per mother  . Headache(784.0)   . Hypertension   . Renal disorder    ESRD  . Sleep apnea    does not wear CPAP    Patient Active Problem List   Diagnosis Date Noted  . ESRD (end stage renal disease) (HCC) 12/04/2015  . Dialysis AV fistula malfunction (HCC)   . Aftercare following surgery of the circulatory system, NEC 09/08/2012  . End stage renal disease (HCC) 09/08/2012    Past Surgical History:  Procedure Laterality Date  . APPENDECTOMY    . AV FISTULA PLACEMENT Left 09/20/2014   Procedure: INSERTION OF 4-7MM X 45CM STRETCH ARTERIOVENOUS (AV) GORE-TEX GRAFT, LEFT FOREARM, BRACHIAL ARTERY TO BRACHIAL VEIN;  Surgeon: Pryor Ochoa, MD;  Location: [facility] OR;  Service: Vascular;  Laterality: Left;   . AV FISTULA PLACEMENT Right 09/13/2015   Procedure: INSERTION OF ARTERIOVENOUS (AV) GORE-TEX GRAFT ARM-RIGHT UPPER ARM; IMMEDIATE STICK GRAFT;  Surgeon: Chuck Hint, MD;  Location: [facility] OR;  Service: Vascular;  Laterality: Right;  . AV FISTULA PLACEMENT Right 12/04/2015   Procedure: INSERTION OF ARTERIOVENOUS (AV) GORE-TEX GRAFT THIGH;  Surgeon: Chuck Hint, MD;  Location: [facility] OR;  Service: Vascular;  Laterality: Right;  . AVF   Left    Undone  . AVF inserted Left    upper  . BASCILIC VEIN TRANSPOSITION Right 07/22/2012   Procedure: BASCILIC VEIN TRANSPOSITION;  Surgeon: Chuck Hint, MD;  [facility]: [facility] OR;  Service: Vascular;  Laterality: Right;  . BASCILIC VEIN TRANSPOSITION Left 09/05/2014   Procedure: BASCILIC VEIN TRANSPOSITION-LEFT;  Surgeon: Sherren Kerns, MD;  Location: [facility] OR;  Service: Vascular;  Laterality: Left;  . DG AV DIALYSIS  SHUNT ACCESS EXIST*L* OR     left- 07/2014  . EXCHANGE OF A DIALYSIS CATHETER Left 12/04/2015   Procedure: EXCHANGE OF A DIALYSIS CATHETER;  Surgeon: Chuck Hint, MD;  Location: [facility] OR;  Service: Vascular;  Laterality: Left;  . LIGATION OF ARTERIOVENOUS  FISTULA Left 09/20/2014   Procedure: LIGATION OF ARTERIOVENOUS  FISTULA, LEFT UPPER ARM;  Surgeon: Pryor Ochoa, MD;  Location: [facility] OR;  Service: Vascular;  Laterality: Left;  . NEPHRECTOMY TRANSPLANTED ORGAN    .  PERIPHERAL VASCULAR CATHETERIZATION Right 10/01/2015   Procedure: Upper Extremity Venography;  Surgeon: Chuck Hint, MD;  Location: [facility] INVASIVE CV LAB;  Service: Cardiovascular;  Laterality: Right;  . THROMBECTOMY W/ EMBOLECTOMY Left 09/20/2014   Procedure: ATTEMPTED THROMBECTOMY LEFT BASILIC VEIN TRANSPOSITION ;  Surgeon: Pryor Ochoa, MD;  Location: [facility] OR;  Service: Vascular;  Laterality: Left;       Home Medications    Prior to Admission medications   Medication Sig Start Date End Date Taking? Authorizing Provider  amLODipine (NORVASC) 10 MG  tablet Take 10 mg by mouth daily. 08/05/14   [provider]  calcium acetate (PHOSLO) 667 MG capsule Take 1,334 mg by mouth 5 (five) times daily. With meals and with snacks 09/15/14   [provider]  cinacalcet (SENSIPAR) 90 MG tablet Take 90 mg by mouth 2 (two) times daily.     [provider]  cyclobenzaprine (FLEXERIL) 10 MG tablet Take 1 tablet (10 mg total) by mouth 2 (two) times daily as needed for muscle spasms. 09/10/16   Fayrene Helper, PA-C  loperamide (IMODIUM) 2 MG capsule Take 2 capsules (4 mg total) by mouth 4 (four) times daily as needed for diarrhea or loose stools. 04/23/15   Linna Hoff, MD  sucroferric oxyhydroxide (VELPHORO) 500 MG chewable tablet Chew 500-1,000 mg by mouth See admin instructions. Take 2 tablets (1000 mg) with meals and 1 tablet (500 mg) with snacks    [provider]  traMADol (ULTRAM) 50 MG tablet Take 1 tablet (50 mg total) by mouth every 6 (six) hours as needed. 09/10/16   Fayrene Helper, PA-C  warfarin (COUMADIN) 7.5 MG tablet Take 7.5 mg by mouth daily.    [provider]    Family History Family History  Problem Relation Age of Onset  . Hypertension Mother   . Peripheral vascular disease Mother   . Diabetes Father   . Hyperlipidemia Father   . Hypertension Father   . Peripheral vascular disease Maternal Grandmother     Social History Social History   Tobacco Use  . Smoking status: Current Some Day Smoker    Packs/day: 0.50    Years: 15.00    Pack years: 7.50    Types: Cigarettes  . Smokeless tobacco: Never Used  Substance Use Topics  . Alcohol use: Yes    Alcohol/week: 0.6 oz    Types: 1 Cans of beer per week    Comment: rare  . Drug use: No     Allergies   Penicillins and Wellbutrin [bupropion]   Review of Systems Review of Systems  All other systems reviewed and are negative.    Physical Exam Updated Vital Signs BP (!) 146/79 (BP Location: Right Arm)   Pulse 69   Temp 98 F (36.7  C) (Oral)   Resp 16   Ht 6\' 1"  (1.854 m)   Wt (!) 171.5 kg (378 lb)   SpO2 99%   BMI 49.87 kg/m   Physical Exam  Constitutional: He appears well-developed and well-nourished. No distress.  Morbidly obese male sitting in bed in no acute discomfort.    HENT:  Head: Atraumatic.  Eyes: Conjunctivae are normal.  Neck: Neck supple.  Cardiovascular: Normal rate and regular rhythm.  Pulmonary/Chest: Effort normal and breath sounds normal. No respiratory distress. He has no wheezes. He has no rales.  Abdominal: Soft. He exhibits no distension. There is no tenderness.  Genitourinary:  Genitourinary Comments: No CVA tenderness  Musculoskeletal:  No reproducible pain  on exam.  Pt points to R mid back at the site of his pain, no overlying skin changes.    Neurological: He is alert.  Skin: No rash noted.  Psychiatric: He has a normal mood and affect.  Nursing note and vitals reviewed.    ED Treatments / Results  Labs (all labs ordered are listed, but only abnormal results are displayed) Labs Reviewed  CBC - Abnormal; Notable for the following components:      Result Value   MCH 23.8 (*)    RDW 15.8 (*)    All other components within normal limits  BASIC METABOLIC PANEL - Abnormal; Notable for the following components:   CO2 21 (*)    Glucose, Bld 102 (*)    Creatinine, Ser 1.43 (*)    Calcium 8.6 (*)    All other components within normal limits  URINALYSIS, ROUTINE W REFLEX MICROSCOPIC    EKG  EKG Interpretation None       Radiology Dg Chest 2 View  Result Date: 04/16/2017 CLINICAL DATA:  Chest pain. EXAM: CHEST  2 VIEW COMPARISON:  12/04/2015. FINDINGS: Interim removal of left IJ line. Mediastinum and hilar structures normal. Stable cardiomegaly. Mild pulmonary venous congestion and interstitial prominence. Mild CHF cannot be excluded. No pleural effusion or pneumothorax. IMPRESSION: 1.  Interim removal of left IJ line. 2. Mild cardiomegaly and bilateral interstitial  prominence. Mild CHF cannot be excluded. Electronically Signed   By: Maisie Fushomas  Register   On: 04/16/2017 10:35    Procedures Procedures (including critical care time)  Medications Ordered in ED Medications - No data to display   Initial Impression / Assessment and Plan / ED Course  I have reviewed the triage vital signs and the nursing notes.  Pertinent labs & imaging results that were available during my care of the patient were reviewed by me and considered in my medical decision making (see chart for details).     BP (!) 151/70 (BP Location: Right Arm)   Pulse 66   Temp 98 F (36.7 C) (Oral)   Resp 16   Ht 6\' 1"  (1.854 m)   Wt (!) 171.5 kg (378 lb)   SpO2 95%   BMI 49.87 kg/m    Final Clinical Impressions(s) / ED Diagnoses   Final diagnoses:  Right flank pain    ED Discharge Orders        Ordered    cyclobenzaprine (FLEXERIL) 10 MG tablet  2 times daily PRN     04/16/17 1109     11:03 AM Patient report pain to his right mid back worsening with movement that  suggestive of a muscle strain.  A chest x-ray obtained showing no evidence of pneumonia.  Examining of skin without any signs of skin rash or infection. Urine shows no signs of urinary tract infection.  Renal function much improved from prior, creatinine is 1.43.  White blood cells normal.  Patient ambulate without difficulty.    At this time, I recommend using muscle relaxant as needed for his pain.  Can continue take his opiate pain medication previously prescribed.  He will follow-up with his primary care provider for further management.  Return precautions discussed.   Fayrene Helperran, Jasdeep Kepner, PA-C 04/16/17 1111    Mesner, Barbara CowerJason, MD 04/16/17 1536

## 2017-04-16 NOTE — ED Triage Notes (Signed)
C/o left flank pain onset 3 days ago states he sneezed last pm and pain was much worse. If he coughs or takes a deep breath worse.

## 2017-09-04 ENCOUNTER — Emergency Department (HOSPITAL_COMMUNITY)
Admission: EM | Admit: 2017-09-04 | Discharge: 2017-09-04 | Disposition: A | Discharge status: Home / Self Care | Payer: Medicare Other | Attending: Emergency Medicine | Admitting: Emergency Medicine

## 2017-09-04 ENCOUNTER — Emergency Department (HOSPITAL_COMMUNITY): Payer: Medicare Other

## 2017-09-04 ENCOUNTER — Other Ambulatory Visit: Payer: Self-pay

## 2017-09-04 ENCOUNTER — Encounter (HOSPITAL_COMMUNITY): Payer: Self-pay | Admitting: Emergency Medicine

## 2017-09-04 DIAGNOSIS — X58XXXA Exposure to other specified factors, initial encounter: Secondary | ICD-10-CM | POA: Insufficient documentation

## 2017-09-04 DIAGNOSIS — Y929 Unspecified place or not applicable: Secondary | ICD-10-CM | POA: Insufficient documentation

## 2017-09-04 DIAGNOSIS — Y939 Activity, unspecified: Secondary | ICD-10-CM | POA: Insufficient documentation

## 2017-09-04 DIAGNOSIS — I10 Essential (primary) hypertension: Secondary | ICD-10-CM | POA: Insufficient documentation

## 2017-09-04 DIAGNOSIS — S99921A Unspecified injury of right foot, initial encounter: Secondary | ICD-10-CM | POA: Diagnosis present

## 2017-09-04 DIAGNOSIS — Z87891 Personal history of nicotine dependence: Secondary | ICD-10-CM | POA: Diagnosis not present

## 2017-09-04 DIAGNOSIS — Y999 Unspecified external cause status: Secondary | ICD-10-CM | POA: Diagnosis not present

## 2017-09-04 DIAGNOSIS — Z79899 Other long term (current) drug therapy: Secondary | ICD-10-CM | POA: Diagnosis not present

## 2017-09-04 DIAGNOSIS — J45909 Unspecified asthma, uncomplicated: Secondary | ICD-10-CM | POA: Diagnosis not present

## 2017-09-04 DIAGNOSIS — S92301A Fracture of unspecified metatarsal bone(s), right foot, initial encounter for closed fracture: Secondary | ICD-10-CM

## 2017-09-04 MED ORDER — IBUPROFEN 200 MG PO TABS
600.0000 mg | ORAL_TABLET | Freq: Once | ORAL | Status: AC
  Administered 2017-09-04: 600 mg via ORAL
  Filled 2017-09-04: qty 3

## 2017-09-04 MED ORDER — OXYCODONE-ACETAMINOPHEN 5-325 MG PO TABS: 1.0000 | ORAL_TABLET | ORAL | 0 refills | Status: DC | PRN

## 2017-09-04 MED ORDER — ACETAMINOPHEN 500 MG PO TABS
1000.0000 mg | ORAL_TABLET | Freq: Once | ORAL | Status: AC
  Administered 2017-09-04: 1000 mg via ORAL
  Filled 2017-09-04: qty 2

## 2017-09-04 NOTE — ED Provider Notes (Signed)
[facility]-EMERGENCY DEPT Provider Note   CSN: [id number] Arrival date & time: 09/04/17  0600     History   Chief Complaint Chief Complaint  Patient presents with  . Foot Pain    HPI Bradley [name] is a 42 y.o. male.  Complains of pain at the base of his right fifth metatarsal for several days.  No new injury.  He did have a fracture of this bone as a teenager.  This swelling in the area noted.  He has trouble bearing weight.  Severity of pain is moderate.  Palpation makes pain worse.     Past Medical History:  Diagnosis Date  . Arthritis   . Asthma    Hx: of as a child  . Back pain    Hx: of back injury  . Family history of adverse reaction to anesthesia    Mother- awoke during leg amputation, per mother  . Headache(784.0)   . Hypertension   . Renal disorder    ESRD  . Sleep apnea    does not wear CPAP    Patient Active Problem List   Diagnosis Date Noted  . ESRD (end stage renal disease) (HCC) 12/04/2015  . Dialysis AV fistula malfunction (HCC)   . Aftercare following surgery of the circulatory system, NEC 09/08/2012  . End stage renal disease (HCC) 09/08/2012    Past Surgical History:  Procedure Laterality Date  . APPENDECTOMY    . AV FISTULA PLACEMENT Left 09/20/2014   Procedure: INSERTION OF 4-7MM X 45CM STRETCH ARTERIOVENOUS (AV) GORE-TEX GRAFT, LEFT FOREARM, BRACHIAL ARTERY TO BRACHIAL VEIN;  Surgeon: Pryor Ochoa, MD;  Location: [facility] OR;  Service: Vascular;  Laterality: Left;  . AV FISTULA PLACEMENT Right 09/13/2015   Procedure: INSERTION OF ARTERIOVENOUS (AV) GORE-TEX GRAFT ARM-RIGHT UPPER ARM; IMMEDIATE STICK GRAFT;  Surgeon: Chuck Hint, MD;  Location: [facility] OR;  Service: Vascular;  Laterality: Right;  . AV FISTULA PLACEMENT Right 12/04/2015   Procedure: INSERTION OF ARTERIOVENOUS (AV) GORE-TEX GRAFT THIGH;  Surgeon: Chuck Hint, MD;  Location: [facility] OR;  Service: Vascular;  Laterality: Right;  . AVF   Left    Undone  . AVF inserted Left    upper  . BASCILIC VEIN TRANSPOSITION Right 07/22/2012   Procedure: BASCILIC VEIN TRANSPOSITION;  Surgeon: Chuck Hint, MD;  Location: [facility] OR;  Service: Vascular;  Laterality: Right;  . BASCILIC VEIN TRANSPOSITION Left 09/05/2014   Procedure: BASCILIC VEIN TRANSPOSITION-LEFT;  Surgeon: Sherren Kerns, MD;  Location: [facility] OR;  Service: Vascular;  Laterality: Left;  . DG AV DIALYSIS  SHUNT ACCESS EXIST*L* OR     left- 07/2014  . EXCHANGE OF A DIALYSIS CATHETER Left 12/04/2015   Procedure: EXCHANGE OF A DIALYSIS CATHETER;  Surgeon: Chuck Hint, MD;  Location: [facility] OR;  Service: Vascular;  Laterality: Left;  . LIGATION OF ARTERIOVENOUS  FISTULA Left 09/20/2014   Procedure: LIGATION OF ARTERIOVENOUS  FISTULA, LEFT UPPER ARM;  Surgeon: Pryor Ochoa, MD;  Location: [facility] OR;  Service: Vascular;  Laterality: Left;  . NEPHRECTOMY TRANSPLANTED ORGAN    . PERIPHERAL VASCULAR CATHETERIZATION Right 10/01/2015   Procedure: Upper Extremity Venography;  Surgeon: Chuck Hint, MD;  Location: [facility] INVASIVE CV LAB;  Service: Cardiovascular;  Laterality: Right;  . THROMBECTOMY W/ EMBOLECTOMY Left 09/20/2014   Procedure: ATTEMPTED THROMBECTOMY LEFT BASILIC VEIN TRANSPOSITION ;  Surgeon: Pryor Ochoa, MD;  Location: [facility] OR;  Service: Vascular;  Laterality: Left;  Home Medications    Prior to Admission medications   Medication Sig Start Date End Date Taking? Authorizing Provider  cinacalcet (SENSIPAR) 90 MG tablet Take 90 mg by mouth See admin instructions. Mon, Wed, Fri   Yes [provider]  furosemide (LASIX) 20 MG tablet Take 40 mg by mouth daily. 08/11/17  Yes [provider]  labetalol (NORMODYNE) 300 MG tablet Take 300 mg by mouth 2 (two) times daily. 07/22/17  Yes [provider]  magnesium oxide (MAG-OX) 400 MG tablet Take 800 mg by mouth daily. 08/21/17  Yes [provider]  mycophenolate (MYFORTIC) 180 MG EC tablet  Take 720 mg by mouth 2 (two) times daily.  08/24/17  Yes [provider]  predniSONE (DELTASONE) 5 MG tablet Take 5 mg by mouth daily. 08/24/17  Yes [provider]  sulfamethoxazole-trimethoprim (BACTRIM,SEPTRA) 400-80 MG tablet Take 1 tablet by mouth See admin instructions. Monday, wed, fri 08/21/17  Yes [provider]  tacrolimus (PROGRAF) 1 MG capsule Take 5 mg by mouth 2 (two) times daily. 11/14/16  Yes [provider]  cyclobenzaprine (FLEXERIL) 10 MG tablet Take 1 tablet (10 mg total) by mouth 2 (two) times daily as needed for muscle spasms. Patient not taking: Reported on 09/04/2017 04/16/17   Fayrene Helper, PA-C  loperamide (IMODIUM) 2 MG capsule Take 2 capsules (4 mg total) by mouth 4 (four) times daily as needed for diarrhea or loose stools. Patient not taking: Reported on 09/04/2017 04/23/15   Linna Hoff, MD  Oxycodone HCl 10 MG TABS Take 10 mg by mouth 3 (three) times daily as needed for pain. 08/31/17   [provider]  oxyCODONE-acetaminophen (PERCOCET) 5-325 MG tablet Take 1-2 tablets by mouth every 4 (four) hours as needed. 09/04/17   Donnetta Hutching, MD  traMADol (ULTRAM) 50 MG tablet Take 1 tablet (50 mg total) by mouth every 6 (six) hours as needed. Patient not taking: Reported on 09/04/2017 09/10/16   Fayrene Helper, PA-C    Family History Family History  Problem Relation Age of Onset  . Hypertension Mother   . Peripheral vascular disease Mother   . Diabetes Father   . Hyperlipidemia Father   . Hypertension Father   . Peripheral vascular disease Maternal Grandmother     Social History Social History   Tobacco Use  . Smoking status: Former Smoker    Packs/day: 0.50    Years: 15.00    Pack years: 7.50    Types: Cigarettes  . Smokeless tobacco: Never Used  Substance Use Topics  . Alcohol use: Yes    Alcohol/week: 0.6 oz    Types: 1 Cans of beer per week    Comment: rare  . Drug use: No     Allergies   Penicillins and Wellbutrin  [bupropion]   Review of Systems Review of Systems  All other systems reviewed and are negative.    Physical Exam Updated Vital Signs BP (!) 156/88 (BP Location: Left Arm)   Pulse 73   Temp 98.9 F (37.2 C)   Resp 18   SpO2 97%   Physical Exam  Constitutional: He is oriented to person, place, and time. He appears well-developed and well-nourished.  HENT:  Head: Normocephalic and atraumatic.  Eyes: Conjunctivae are normal.  Neck: Neck supple.  Musculoskeletal:  Right foot: Slight edema.  Tender at the base of the fifth metatarsal.  Neurological: He is alert and oriented to person, place, and time.  Skin: Skin is warm and dry.  Psychiatric: He has a normal mood and affect. His behavior is normal.  Nursing note and vitals reviewed.    ED Treatments / Results  Labs (all labs ordered are listed, but only abnormal results are displayed) Labs Reviewed - No data to display  EKG None  Radiology Dg Foot Complete Right  Result Date: 09/04/2017 CLINICAL DATA:  Chronic foot pain worsening over the last several weeks. EXAM: RIGHT FOOT COMPLETE - 3+ VIEW COMPARISON:  None. FINDINGS: Apparent avulsion at the base of the fifth metatarsal. This could be traumatic or related to stress. The exact age is indeterminate. It could be recent or somewhat older. There appear to be healed fractures of the proximal third and fourth metatarsals. Other bones of the foot appear normal. No abnormality of the toes. There is some degree of flatfoot with some degenerative changes of the midfoot. There could be ankle impingement anteriorly between the anterior process of the talus and the tibia. IMPRESSION: Abnormal appearance at the base of the fifth metatarsal that appears represent an avulsion fracture. See above discussion. This could be traumatic or related to repetitive stress. Exact age indeterminate. Flatfoot. Midfoot and ankle degenerative changes including bony changes at the ankle anteriorly that  could be associated with impingement. Electronically Signed   By: Paulina FusiMark  Shogry M.D.   On: 09/04/2017 08:40    Procedures Procedures (including critical care time)  Medications Ordered in ED Medications  acetaminophen (TYLENOL) tablet 1,000 mg (1,000 mg Oral Given 09/04/17 0821)  ibuprofen (ADVIL,MOTRIN) tablet 600 mg (600 mg Oral Given 09/04/17 [id number])     Initial Impression / Assessment and Plan / ED Course  I have reviewed the triage vital signs and the nursing notes.  Pertinent labs & imaging results that were available during my care of the patient were reviewed by me and considered in my medical decision making (see chart for details).     Plain films of right foot suggest an avulsion fracture at the base of the right fifth metatarsal.  Wooden shoe, crutches, percocet for pain  Final Clinical Impressions(s) / ED Diagnoses   Final diagnoses:  Avulsion fracture of metatarsal bone of right foot, closed, initial encounter    ED Discharge Orders        Ordered    oxyCODONE-acetaminophen (PERCOCET) 5-325 MG tablet  Every 4 hours PRN     09/04/17 1002       Donnetta Hutchingook, Thelmer Legler, MD 09/04/17 1017

## 2017-09-04 NOTE — ED Triage Notes (Signed)
Pt  C/o right foot pain chronic in nature post football injury in his youth. Pain started over last several week worse yesterday a 1700

## 2017-09-04 NOTE — Discharge Instructions (Addendum)
You have an avulsion fracture of the metatarsal bone of your foot.  Ice, elevate, boot, crutches, follow-up with orthopedics.

## 2017-09-04 NOTE — ED Notes (Signed)
Patient verbalized understanding of discharge instructions and prescriptions, patient wanted to speak with doctor. Dr. Adriana Simasook at bedside answering patients questions. Patient out of ED via wheelchair in no distress.

## 2018-07-23 DIAGNOSIS — I639 Cerebral infarction, unspecified: Secondary | ICD-10-CM

## 2018-07-23 HISTORY — DX: Cerebral infarction, unspecified: I63.9

## 2018-08-17 ENCOUNTER — Other Ambulatory Visit: Payer: Self-pay

## 2018-08-17 ENCOUNTER — Inpatient Hospital Stay (HOSPITAL_COMMUNITY)
Admission: EM | Admit: 2018-08-17 | Discharge: 2018-08-19 | DRG: 064 | Disposition: A | Discharge status: Home / Self Care | Payer: Medicare Other | Attending: Internal Medicine | Admitting: Internal Medicine

## 2018-08-17 ENCOUNTER — Encounter (HOSPITAL_COMMUNITY): Payer: Self-pay | Admitting: Emergency Medicine

## 2018-08-17 ENCOUNTER — Emergency Department (HOSPITAL_COMMUNITY): Payer: Medicare Other

## 2018-08-17 ENCOUNTER — Inpatient Hospital Stay (HOSPITAL_COMMUNITY): Payer: Medicare Other

## 2018-08-17 DIAGNOSIS — Z79899 Other long term (current) drug therapy: Secondary | ICD-10-CM

## 2018-08-17 DIAGNOSIS — Z91010 Allergy to peanuts: Secondary | ICD-10-CM

## 2018-08-17 DIAGNOSIS — F17210 Nicotine dependence, cigarettes, uncomplicated: Secondary | ICD-10-CM | POA: Diagnosis present

## 2018-08-17 DIAGNOSIS — Z833 Family history of diabetes mellitus: Secondary | ICD-10-CM

## 2018-08-17 DIAGNOSIS — M549 Dorsalgia, unspecified: Secondary | ICD-10-CM | POA: Diagnosis present

## 2018-08-17 DIAGNOSIS — R519 Headache, unspecified: Secondary | ICD-10-CM

## 2018-08-17 DIAGNOSIS — G8929 Other chronic pain: Secondary | ICD-10-CM | POA: Diagnosis present

## 2018-08-17 DIAGNOSIS — I63112 Cerebral infarction due to embolism of left vertebral artery: Secondary | ICD-10-CM | POA: Diagnosis not present

## 2018-08-17 DIAGNOSIS — M1990 Unspecified osteoarthritis, unspecified site: Secondary | ICD-10-CM | POA: Diagnosis present

## 2018-08-17 DIAGNOSIS — Z720 Tobacco use: Secondary | ICD-10-CM | POA: Diagnosis not present

## 2018-08-17 DIAGNOSIS — N183 Chronic kidney disease, stage 3 unspecified: Secondary | ICD-10-CM | POA: Diagnosis present

## 2018-08-17 DIAGNOSIS — Z888 Allergy status to other drugs, medicaments and biological substances status: Secondary | ICD-10-CM | POA: Diagnosis not present

## 2018-08-17 DIAGNOSIS — E785 Hyperlipidemia, unspecified: Secondary | ICD-10-CM | POA: Diagnosis present

## 2018-08-17 DIAGNOSIS — I160 Hypertensive urgency: Secondary | ICD-10-CM | POA: Diagnosis present

## 2018-08-17 DIAGNOSIS — Z6841 Body Mass Index (BMI) 40.0 and over, adult: Secondary | ICD-10-CM | POA: Diagnosis not present

## 2018-08-17 DIAGNOSIS — Z20828 Contact with and (suspected) exposure to other viral communicable diseases: Secondary | ICD-10-CM | POA: Diagnosis present

## 2018-08-17 DIAGNOSIS — Z940 Kidney transplant status: Secondary | ICD-10-CM | POA: Diagnosis not present

## 2018-08-17 DIAGNOSIS — I639 Cerebral infarction, unspecified: Secondary | ICD-10-CM | POA: Diagnosis present

## 2018-08-17 DIAGNOSIS — Z8249 Family history of ischemic heart disease and other diseases of the circulatory system: Secondary | ICD-10-CM | POA: Diagnosis not present

## 2018-08-17 DIAGNOSIS — F419 Anxiety disorder, unspecified: Secondary | ICD-10-CM | POA: Diagnosis present

## 2018-08-17 DIAGNOSIS — Z7952 Long term (current) use of systemic steroids: Secondary | ICD-10-CM | POA: Diagnosis not present

## 2018-08-17 DIAGNOSIS — Z8349 Family history of other endocrine, nutritional and metabolic diseases: Secondary | ICD-10-CM | POA: Diagnosis not present

## 2018-08-17 DIAGNOSIS — R260 Ataxic gait: Secondary | ICD-10-CM | POA: Diagnosis present

## 2018-08-17 DIAGNOSIS — R29700 NIHSS score 0: Secondary | ICD-10-CM | POA: Diagnosis present

## 2018-08-17 DIAGNOSIS — G936 Cerebral edema: Secondary | ICD-10-CM | POA: Diagnosis present

## 2018-08-17 DIAGNOSIS — Z8673 Personal history of transient ischemic attack (TIA), and cerebral infarction without residual deficits: Secondary | ICD-10-CM

## 2018-08-17 DIAGNOSIS — G4733 Obstructive sleep apnea (adult) (pediatric): Secondary | ICD-10-CM | POA: Diagnosis present

## 2018-08-17 DIAGNOSIS — I6300 Cerebral infarction due to thrombosis of unspecified precerebral artery: Secondary | ICD-10-CM | POA: Diagnosis not present

## 2018-08-17 DIAGNOSIS — R51 Headache: Secondary | ICD-10-CM | POA: Diagnosis not present

## 2018-08-17 LAB — CBC WITH DIFFERENTIAL/PLATELET
Abs Immature Granulocytes: 0.02 10*3/uL (ref 0.00–0.07)
Basophils Absolute: 0 10*3/uL (ref 0.0–0.1)
Basophils Relative: 0 %
Eosinophils Absolute: 0.3 10*3/uL (ref 0.0–0.5)
Eosinophils Relative: 4 %
HCT: 48.7 % (ref 39.0–52.0)
Hemoglobin: 14.9 g/dL (ref 13.0–17.0)
Immature Granulocytes: 0 %
Lymphocytes Relative: 10 %
Lymphs Abs: 0.6 10*3/uL — ABNORMAL LOW (ref 0.7–4.0)
MCH: 27 pg (ref 26.0–34.0)
MCHC: 30.6 g/dL (ref 30.0–36.0)
MCV: 88.2 fL (ref 80.0–100.0)
Monocytes Absolute: 0.6 10*3/uL (ref 0.1–1.0)
Monocytes Relative: 9 %
Neutro Abs: 4.8 10*3/uL (ref 1.7–7.7)
Neutrophils Relative %: 77 %
Platelets: 181 10*3/uL (ref 150–400)
RBC: 5.52 MIL/uL (ref 4.22–5.81)
RDW: 15 % (ref 11.5–15.5)
WBC: 6.3 10*3/uL (ref 4.0–10.5)
nRBC: 0 % (ref 0.0–0.2)

## 2018-08-17 LAB — SARS CORONAVIRUS 2 BY RT PCR (HOSPITAL ORDER, PERFORMED IN ~~LOC~~ HOSPITAL LAB): SARS Coronavirus 2: NEGATIVE

## 2018-08-17 LAB — BASIC METABOLIC PANEL
Anion gap: 7 (ref 5–15)
BUN: 16 mg/dL (ref 6–20)
CO2: 24 mmol/L (ref 22–32)
Calcium: 10 mg/dL (ref 8.9–10.3)
Chloride: 108 mmol/L (ref 98–111)
Creatinine, Ser: 1.48 mg/dL — ABNORMAL HIGH (ref 0.61–1.24)
GFR calc Af Amer: >60 mL/min (ref >60)
GFR calc non Af Amer: 58 mL/min — ABNORMAL LOW (ref >60)
Glucose, Bld: 131 mg/dL — ABNORMAL HIGH (ref 70–99)
Potassium: 4.1 mmol/L (ref 3.5–5.1)
Sodium: 139 mmol/L (ref 135–145)

## 2018-08-17 LAB — PROTIME-INR
INR: 1.1 (ref 0.8–1.2)
Prothrombin Time: 13.6 seconds (ref 11.4–15.2)

## 2018-08-17 LAB — TROPONIN I: Troponin I: <0.03 ng/mL (ref <0.03)

## 2018-08-17 MED ORDER — ACETAMINOPHEN 325 MG PO TABS
650.0000 mg | ORAL_TABLET | ORAL | Status: DC | PRN
  Administered 2018-08-17 – 2018-08-19 (×4): 650 mg via ORAL
  Filled 2018-08-17 (×4): qty 2

## 2018-08-17 MED ORDER — LOPERAMIDE HCL 2 MG PO CAPS: 4.0000 mg | ORAL_CAPSULE | Freq: Four times a day (QID) | ORAL | Status: DC | PRN

## 2018-08-17 MED ORDER — TACROLIMUS 1 MG PO CAPS
5.0000 mg | ORAL_CAPSULE | Freq: Two times a day (BID) | ORAL | Status: DC
  Administered 2018-08-17 – 2018-08-19 (×5): 5 mg via ORAL
  Filled 2018-08-17 (×6): qty 5

## 2018-08-17 MED ORDER — MAGNESIUM OXIDE 400 (241.3 MG) MG PO TABS
800.0000 mg | ORAL_TABLET | Freq: Two times a day (BID) | ORAL | Status: DC
  Administered 2018-08-17 – 2018-08-19 (×5): 800 mg via ORAL
  Filled 2018-08-17 (×5): qty 2

## 2018-08-17 MED ORDER — SODIUM CHLORIDE 0.9 % IV SOLN
Freq: Once | INTRAVENOUS | Status: AC
  Administered 2018-08-17: 08:00:00 via INTRAVENOUS

## 2018-08-17 MED ORDER — SULFAMETHOXAZOLE-TRIMETHOPRIM 400-80 MG PO TABS
1.0000 | ORAL_TABLET | ORAL | Status: DC
  Administered 2018-08-18: 1 via ORAL
  Filled 2018-08-17: qty 1

## 2018-08-17 MED ORDER — STROKE: EARLY STAGES OF RECOVERY BOOK
Freq: Once | Status: AC
  Administered 2018-08-17: 12:00:00
  Filled 2018-08-17: qty 1

## 2018-08-17 MED ORDER — METOCLOPRAMIDE HCL 5 MG/ML IJ SOLN
10.0000 mg | Freq: Once | INTRAMUSCULAR | Status: AC
  Administered 2018-08-17: 10 mg via INTRAVENOUS
  Filled 2018-08-17: qty 2

## 2018-08-17 MED ORDER — ACETAMINOPHEN 500 MG PO TABS
1000.0000 mg | ORAL_TABLET | Freq: Once | ORAL | Status: AC
  Administered 2018-08-17: 1000 mg via ORAL
  Filled 2018-08-17: qty 2

## 2018-08-17 MED ORDER — ASPIRIN EC 325 MG PO TBEC
325.0000 mg | DELAYED_RELEASE_TABLET | Freq: Every day | ORAL | Status: DC
  Administered 2018-08-17 – 2018-08-19 (×3): 325 mg via ORAL
  Filled 2018-08-17 (×3): qty 1

## 2018-08-17 MED ORDER — ACETAMINOPHEN 650 MG RE SUPP: 650.0000 mg | RECTAL | Status: DC | PRN

## 2018-08-17 MED ORDER — ATORVASTATIN CALCIUM 80 MG PO TABS
80.0000 mg | ORAL_TABLET | Freq: Every day | ORAL | Status: DC
  Administered 2018-08-17 – 2018-08-18 (×2): 80 mg via ORAL
  Filled 2018-08-17 (×2): qty 1

## 2018-08-17 MED ORDER — MYCOPHENOLATE SODIUM 180 MG PO TBEC
720.0000 mg | DELAYED_RELEASE_TABLET | Freq: Two times a day (BID) | ORAL | Status: DC
  Administered 2018-08-17 – 2018-08-19 (×5): 720 mg via ORAL
  Filled 2018-08-17 (×6): qty 4

## 2018-08-17 MED ORDER — MAGNESIUM SULFATE IN D5W 1-5 GM/100ML-% IV SOLN
1.0000 g | Freq: Once | INTRAVENOUS | Status: AC
  Administered 2018-08-17: 1 g via INTRAVENOUS
  Filled 2018-08-17: qty 100

## 2018-08-17 MED ORDER — LORAZEPAM 2 MG/ML IJ SOLN
0.5000 mg | Freq: Four times a day (QID) | INTRAMUSCULAR | Status: DC | PRN
  Administered 2018-08-17 – 2018-08-19 (×3): 0.5 mg via INTRAVENOUS
  Filled 2018-08-17 (×3): qty 1

## 2018-08-17 MED ORDER — SENNOSIDES-DOCUSATE SODIUM 8.6-50 MG PO TABS: 1.0000 | ORAL_TABLET | Freq: Every evening | ORAL | Status: DC | PRN

## 2018-08-17 MED ORDER — LABETALOL HCL 200 MG PO TABS
300.0000 mg | ORAL_TABLET | Freq: Two times a day (BID) | ORAL | Status: DC
  Administered 2018-08-17 – 2018-08-19 (×5): 300 mg via ORAL
  Filled 2018-08-17 (×5): qty 1

## 2018-08-17 MED ORDER — PREDNISONE 5 MG PO TABS
5.0000 mg | ORAL_TABLET | Freq: Every day | ORAL | Status: DC
  Administered 2018-08-17 – 2018-08-19 (×3): 5 mg via ORAL
  Filled 2018-08-17 (×3): qty 1

## 2018-08-17 MED ORDER — CINACALCET HCL 30 MG PO TABS
90.0000 mg | ORAL_TABLET | ORAL | Status: DC
  Administered 2018-08-18: 90 mg via ORAL
  Filled 2018-08-17: qty 3

## 2018-08-17 MED ORDER — ACETAMINOPHEN 160 MG/5ML PO SOLN: 650.0000 mg | ORAL | Status: DC | PRN

## 2018-08-17 MED ORDER — PROCHLORPERAZINE EDISYLATE 10 MG/2ML IJ SOLN
10.0000 mg | Freq: Once | INTRAMUSCULAR | Status: AC
  Administered 2018-08-17: 10 mg via INTRAMUSCULAR
  Filled 2018-08-17: qty 2

## 2018-08-17 MED ORDER — OXYCODONE HCL 5 MG PO TABS
10.0000 mg | ORAL_TABLET | Freq: Three times a day (TID) | ORAL | Status: DC | PRN
  Administered 2018-08-17 – 2018-08-18 (×4): 10 mg via ORAL
  Filled 2018-08-17 (×4): qty 2

## 2018-08-17 MED ORDER — DIPHENHYDRAMINE HCL 50 MG/ML IJ SOLN
25.0000 mg | Freq: Once | INTRAMUSCULAR | Status: AC
  Administered 2018-08-17: 25 mg via INTRAVENOUS
  Filled 2018-08-17: qty 1

## 2018-08-17 MED ORDER — LABETALOL HCL 5 MG/ML IV SOLN
20.0000 mg | Freq: Once | INTRAVENOUS | Status: AC
  Administered 2018-08-17: 20 mg via INTRAVENOUS
  Filled 2018-08-17: qty 4

## 2018-08-17 MED ORDER — ENOXAPARIN SODIUM 40 MG/0.4ML ~~LOC~~ SOLN
40.0000 mg | SUBCUTANEOUS | Status: DC
  Administered 2018-08-17 – 2018-08-19 (×3): 40 mg via SUBCUTANEOUS
  Filled 2018-08-17 (×3): qty 0.4

## 2018-08-17 MED ORDER — FUROSEMIDE 40 MG PO TABS
40.0000 mg | ORAL_TABLET | Freq: Every day | ORAL | Status: DC
  Administered 2018-08-17 – 2018-08-19 (×3): 40 mg via ORAL
  Filled 2018-08-17 (×3): qty 1

## 2018-08-17 NOTE — ED Notes (Signed)
Dr. Cardama at bedside.  

## 2018-08-17 NOTE — ED Provider Notes (Signed)
Assumed care from Dr. Eudelia Bunch at 7 AM. Briefly, the patient is a 43 y.o. male with PMHx of  has a past medical history of Arthritis, Asthma, Back pain, Family history of adverse reaction to anesthesia, Headache(784.0), Hypertension, Renal disorder, and Sleep apnea. here with headache.   Labs Reviewed  CBC WITH DIFFERENTIAL/PLATELET - Abnormal; Notable for the following components:      Result Value   Lymphs Abs 0.6 (*)    All other components within normal limits  BASIC METABOLIC PANEL - Abnormal; Notable for the following components:   Glucose, Bld 131 (*)    Creatinine, Ser 1.48 (*)    GFR calc non Af Amer 58 (*)    All other components within normal limits  PROTIME-INR  TROPONIN I    Course of Care: -On my interview, pt has subtle loss of balance w/ his HA. 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 Outside of window (HA x 2 days). Neuro consulted, admit to medicine.     Shaune Pollack, MD 08/17/18 [id number]

## 2018-08-17 NOTE — H&P (Signed)
History and Physical    Levin Dagostino Klinkner RUE:[id number] DOB: [birth year] DOA: 08/17/2018  Referring MD/NP/PA: Shaune Pollack, MD PCP: Karle Plumber, MD  Patient coming from: Home  Chief Complaint: Headache  I have personally briefly reviewed patient's old medical records in [facility]   HPI: Bradley [name] is a 43 y.o. male with medical history significant of asthma, CKD s/p renal transplant, morbid obesity, arthritis, and OSA; who presents with complaints of headache for last 2 days.  He reports he was woken out of sleep early Sunday morning with a frontal headache.  Report having pain in his neck as well and being off balance when trying to get up and walk.  Denied having any double vision, focal weakness, changes in sensation, fall, fever, nausea, vomiting, or shortness of breath symptoms.  He drinks alcohol occasionally, but does admit to recently starting back smoking cigarettes.  Declines need for nicotine patch.    ED Course: On admission into the emergency department patient was seen to be afebrile, but blood pressure was elevated up  to 223/120.  Due to symptoms a CT scan was obtained which revealed a left cerebellar stroke the patient was outside the window for any acute intervention.  Neurology was consulted recommending completion of stroke work-up.  Review of Systems  Constitutional: Negative for chills and fever.  HENT: Positive for congestion. Negative for ear discharge.   Eyes: Negative for photophobia and pain.  Respiratory: Negative for cough and shortness of breath.   Cardiovascular: Positive for leg swelling. Negative for chest pain.  Gastrointestinal: Negative for abdominal pain, nausea and vomiting.  Genitourinary: Negative for dysuria and hematuria.  Musculoskeletal: Positive for back pain and joint pain. Negative for falls.  Skin: Negative for rash.  Neurological: Positive for headaches. Negative for loss of consciousness.       Positive for off balance    Endo/Heme/Allergies: Negative for polydipsia.  Psychiatric/Behavioral: Negative for memory loss. The patient is nervous/anxious.     Past Medical History:  Diagnosis Date   Arthritis    Asthma    Hx: of as a child   Back pain    Hx: of back injury   Family history of adverse reaction to anesthesia    Mother- awoke during leg amputation, per mother   Headache(784.0)    Hypertension    Renal disorder    ESRD   Sleep apnea        Past Surgical History:  Procedure Laterality Date   APPENDECTOMY     AV FISTULA PLACEMENT Left 09/20/2014   Procedure: INSERTION OF 4-7MM X 45CM STRETCH ARTERIOVENOUS (AV) GORE-TEX GRAFT, LEFT FOREARM, BRACHIAL ARTERY TO BRACHIAL VEIN;  Surgeon: Pryor Ochoa, MD;  Location: [facility] OR;  Service: Vascular;  Laterality: Left;   AV FISTULA PLACEMENT Right 09/13/2015   Procedure: INSERTION OF ARTERIOVENOUS (AV) GORE-TEX GRAFT ARM-RIGHT UPPER ARM; IMMEDIATE STICK GRAFT;  Surgeon: Chuck Hint, MD;  Location: [facility] OR;  Service: Vascular;  Laterality: Right;   AV FISTULA PLACEMENT Right 12/04/2015   Procedure: INSERTION OF ARTERIOVENOUS (AV) GORE-TEX GRAFT THIGH;  Surgeon: Chuck Hint, MD;  Location: [facility] OR;  Service: Vascular;  Laterality: Right;   AVF   Left    Undone   AVF inserted Left    upper   BASCILIC VEIN TRANSPOSITION Right 07/22/2012   Procedure: BASCILIC VEIN TRANSPOSITION;  Surgeon: Chuck Hint, MD;  Location: [facility] OR;  Service: Vascular;  Laterality: Right;   BASCILIC VEIN TRANSPOSITION Left  09/05/2014   Procedure: BASCILIC VEIN TRANSPOSITION-LEFT;  Surgeon: Sherren Kerns, MD;  Location: [facility] OR;  Service: Vascular;  Laterality: Left;   DG AV DIALYSIS  SHUNT ACCESS EXIST*L* OR     left- 07/2014   EXCHANGE OF A DIALYSIS CATHETER Left 12/04/2015   Procedure: EXCHANGE OF A DIALYSIS CATHETER;  Surgeon: Chuck Hint, MD;  Location: [facility] OR;  Service: Vascular;  Laterality: Left;   LIGATION OF ARTERIOVENOUS   FISTULA Left 09/20/2014   Procedure: LIGATION OF ARTERIOVENOUS  FISTULA, LEFT UPPER ARM;  Surgeon: Pryor Ochoa, MD;  Location: [facility] OR;  Service: Vascular;  Laterality: Left;   NEPHRECTOMY TRANSPLANTED ORGAN     PERIPHERAL VASCULAR CATHETERIZATION Right 10/01/2015   Procedure: Upper Extremity Venography;  Surgeon: Chuck Hint, MD;  Location: [facility] INVASIVE CV LAB;  Service: Cardiovascular;  Laterality: Right;   THROMBECTOMY W/ EMBOLECTOMY Left 09/20/2014   Procedure: ATTEMPTED THROMBECTOMY LEFT BASILIC VEIN TRANSPOSITION ;  Surgeon: Pryor Ochoa, MD;  Location: [facility] OR;  Service: Vascular;  Laterality: Left;     reports that he has quit smoking. His smoking use included cigarettes. He has a 7.50 pack-year smoking history. He has never used smokeless tobacco. He reports current alcohol use of about 1.0 standard drinks of alcohol per week. He reports that he does not use drugs.  Allergies  Allergen Reactions   Penicillins Anaphylaxis and Other (See Comments)    Has patient had a PCN reaction causing immediate rash, facial/tongue/throat swelling, SOB or lightheadedness with hypotension: Yes Has patient had a PCN reaction causing severe rash involving mucus membranes or skin necrosis: Yes Has patient had a PCN reaction that required hospitalization Yes Has patient had a PCN reaction occurring within the last 10 years: Unknown If all of the above answers are "NO", then may proceed with Cephalosporin use.   Wellbutrin [Bupropion] Rash    Family History  Problem Relation Age of Onset   Hypertension Mother    Peripheral vascular disease Mother    Diabetes Father    Hyperlipidemia Father    Hypertension Father    Peripheral vascular disease Maternal Grandmother     Prior to Admission medications   Medication Sig Start Date End Date Taking? Authorizing Provider  allopurinol (ZYLOPRIM) 300 MG tablet Take 300 mg by mouth daily. 08/14/18  Yes [provider]  cinacalcet  (SENSIPAR) 90 MG tablet Take 90 mg by mouth See admin instructions. Mon, Wed, Fri   Yes [provider]  furosemide (LASIX) 20 MG tablet Take 40 mg by mouth daily. 08/11/17  Yes [provider]  labetalol (NORMODYNE) 300 MG tablet Take 300 mg by mouth 2 (two) times daily. 07/22/17  Yes [provider]  loperamide (IMODIUM) 2 MG capsule Take 2 capsules (4 mg total) by mouth 4 (four) times daily as needed for diarrhea or loose stools. 04/23/15  Yes Kindl, Quita Skye, MD  magnesium oxide (MAG-OX) 400 MG tablet Take 800 mg by mouth 2 (two) times daily.  08/21/17  Yes [provider]  mycophenolate (MYFORTIC) 180 MG EC tablet Take 720 mg by mouth 2 (two) times daily.  08/24/17  Yes [provider]  Oxycodone HCl 10 MG TABS Take 10 mg by mouth 3 (three) times daily as needed for pain. 08/31/17  Yes [provider]  predniSONE (DELTASONE) 5 MG tablet Take 5 mg by mouth daily. 08/24/17  Yes [provider]  sulfamethoxazole-trimethoprim (BACTRIM,SEPTRA) 400-80 MG tablet Take 1 tablet by mouth See admin  instructions. Monday, wed, fri 08/21/17  Yes [provider]  tacrolimus (PROGRAF) 1 MG capsule Take 5 mg by mouth 2 (two) times daily. 11/14/16  Yes [provider]  Vitamin D, Ergocalciferol, (DRISDOL) 1.25 MG (50000 UT) CAPS capsule Take 50,000 Units by mouth once a week. Wednesday 08/14/18  Yes [provider]  cyclobenzaprine (FLEXERIL) 10 MG tablet Take 1 tablet (10 mg total) by mouth 2 (two) times daily as needed for muscle spasms. Patient not taking: Reported on 09/04/2017 04/16/17   Fayrene Helperran, Bowie, PA-C  oxyCODONE-acetaminophen (PERCOCET) 5-325 MG tablet Take 1-2 tablets by mouth every 4 (four) hours as needed. Patient not taking: Reported on 08/17/2018 09/04/17   Donnetta Hutchingook, Brian, MD  traMADol (ULTRAM) 50 MG tablet Take 1 tablet (50 mg total) by mouth every 6 (six) hours as needed. Patient not taking: Reported on 09/04/2017 09/10/16   Fayrene Helperran,  [place]ie, PA-C    Physical Exam:  Constitutional: Obese middle-age male NAD appears restless Vitals:   08/17/18 0815 08/17/18 0900 08/17/18 0915 08/17/18 0930  BP: (!) 201/102 (!) 198/96 (!) 202/94 (!) 187/98  Pulse: 67 76 74 72  Resp:    (!) 21  Temp:      TempSrc:      SpO2: 94% 97% 97% 95%  Weight:      Height:       Eyes: PERRL, lids and conjunctivae normal ENMT: Mucous membranes are moist. Posterior pharynx clear of any exudate or lesions.  Neck: normal, supple, no masses, no thyromegaly Respiratory: Decreased overall aeration, no wheezing, no crackles. Normal respiratory effort. No accessory muscle use.  Cardiovascular: Regular rate and rhythm, no murmurs / rubs / gallops.  Trace lower extremity edema. 2+ pedal pulses. No carotid bruits.  Abdomen: no tenderness, no masses palpated. No hepatosplenomegaly. Bowel sounds positive.  Musculoskeletal: no clubbing / cyanosis. No joint deformity upper and lower extremities. Good ROM, no contractures. Normal muscle tone.  Skin: no rashes, lesions, ulcers. No induration Neurologic: CN 2-12 grossly intact. Sensation intact, DTR normal. Strength 5/5 in all 4.  Psychiatric: Normal judgment and insight. Alert and oriented x 3.  Anxious mood.     Labs on Admission: I have personally reviewed following labs and imaging studies  CBC: Recent Labs  Lab 08/17/18 0750  WBC 6.3  NEUTROABS 4.8  HGB 14.9  HCT 48.7  MCV 88.2  PLT 181   Basic Metabolic Panel: Recent Labs  Lab 08/17/18 0750  NA 139  K 4.1  CL 108  CO2 24  GLUCOSE 131*  BUN 16  CREATININE 1.48*  CALCIUM 10.0   GFR: Estimated Creatinine Clearance: 114.3 mL/min (A) (by C-G formula based on SCr of 1.48 mg/dL (H)). Liver Function Tests: No results for input(s): AST, ALT, ALKPHOS, BILITOT, PROT, ALBUMIN in the last 168 hours. No results for input(s): LIPASE, AMYLASE in the last 168 hours. No results for input(s): AMMONIA in the last 168 hours. Coagulation  Profile: Recent Labs  Lab 08/17/18 0901  INR 1.1   Cardiac Enzymes: No results for input(s): CKTOTAL, CKMB, CKMBINDEX, TROPONINI in the last 168 hours. BNP (last 3 results) No results for input(s): PROBNP in the last 8760 hours. HbA1C: No results for input(s): HGBA1C in the last 72 hours. CBG: No results for input(s): GLUCAP in the last 168 hours. Lipid Profile: No results for input(s): CHOL, HDL, LDLCALC, TRIG, CHOLHDL, LDLDIRECT in the last 72 hours. Thyroid Function Tests: No results for input(s): TSH, T4TOTAL, FREET4, T3FREE, THYROIDAB in the last 72 hours.  Anemia Panel: No results for input(s): VITAMINB12, FOLATE, FERRITIN, TIBC, IRON, RETICCTPCT in the last 72 hours. Urine analysis:    Component Value Date/Time   COLORURINE YELLOW 04/16/2017 0819   APPEARANCEUR CLEAR 04/16/2017 0819   LABSPEC 1.018 04/16/2017 0819   PHURINE 7.0 04/16/2017 0819   GLUCOSEU NEGATIVE 04/16/2017 0819   HGBUR NEGATIVE 04/16/2017 0819   BILIRUBINUR NEGATIVE 04/16/2017 0819   KETONESUR NEGATIVE 04/16/2017 0819   PROTEINUR NEGATIVE 04/16/2017 0819   NITRITE NEGATIVE 04/16/2017 0819   LEUKOCYTESUR NEGATIVE 04/16/2017 0819   Sepsis Labs: No results found for this or any previous visit (from the past 240 hour(s)).   Radiological Exams on Admission: Ct Head Wo Contrast  Result Date: 08/17/2018 CLINICAL DATA:  Headache for 2 days, no nausea or vomiting. History of hypertension. History of end-stage renal disease. EXAM: CT HEAD WITHOUT CONTRAST TECHNIQUE: Contiguous axial images were obtained from the base of the skull through the vertex without intravenous contrast. COMPARISON:  None. FINDINGS: Brain: There is a well-defined area of hypoattenuation in the LEFT inferior cerebellum, corresponding to much of the LEFT posterior inferior cerebellar artery territory, consistent with acute infarction. Slight mass effect on the fourth ventricle, and slight LEFT-to-RIGHT shift, without visible hemorrhage.  Elsewhere, no similar areas of hypoattenuation. No hydrocephalus, extra-axial fluid, or mass lesion. Vascular: Calcification of the cavernous internal carotid arteries consistent with cerebrovascular atherosclerotic disease. No signs of intracranial large vessel occlusion. Skull: Normal. Negative for fracture or focal lesion. Sinuses/Orbits: No acute finding. Other: None. 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 Slight mass effect on the fourth ventricle and slight RIGHT-to-LEFT shift. Close clinical observation is warranted. These results were called by telephone at the time of interpretation on 08/17/2018 at 8:40 am to Dr. Shaune Pollack , who verbally acknowledged these results. Electronically Signed   By: Elsie Stain M.D.   On: 08/17/2018 08:41    EKG: Independently reviewed.  Sinus rhythm at 70 bpm.  Assessment/Plan Left cerebellar stroke with mass-effect: Acute.  Patient presents with complaints of a headache and difficulty with walking.  CT scan revealed an acute left posterior inferior cerebellar artery infarct without hemorrhage and slight mass-effect in the fourth ventricle with right to left shift.  Risk factors for stroke include HTN, tobacco use, and morbid obesity. - Admit to telemetry bed - Stroke order set initiated - Neuro checks - Check  MRI/MRA head w/o contrast and vas carotid U/S - PT/OT/Speech to eval and treat - Check echocardiogram - Check Hemoglobin A1c and lipid panel in a.m. - ASA and statin - Appreciate neurology consultative services, will follow-up  - Social work consult  - Call neurology immediately if any change in neuro exam as patient may need to be placed on hypertonic saline if showing signs of herniation -Repeat CT scan ordered for in a.m.  Hypertensive urgency/emergency: Acute.  Blood pressures elevated up to 223/120 on admission.  Patient had been given IV labetalol and restarted on home  medications. -Continue labetalol and furosemide -Hydralazine IV as needed elevated blood pressure  Chronic kidney disease s/p renal transplant: Patient reports having previous renal transplant approximately 3 years ago at [facility].  Currently on immunosuppressive therapy.  Creatinine 1.48 which appears near previously documented creatinine in 03/2017. -continue CellCept, Prograf, Myfortic, Bactrim, and prednisone   Hyperlipidemia -Follow-up lipid panel -Atorvastatin 80 mg daily  Arthritis, chronic back pain: Patient on oxycodone for treatment. -Continue oxycodone as needed   Anxiety: Patient reports feeling very anxious after being  diagnosed with stroke. -Ativan IV as needed for anxiety  Tobacco use: Patient reports that he recently started back smoking cigarettes.  Declined need for nicotine patch. -Counseled on need of cessation of tobacco use  Morbid obesity: BMI 53.7 kg/m -Would likely benefit from a bariatric referral in outpatient setting  OSA -CPAP at night   DVT prophylaxis: lovenox   Code Status: full Family Communication: No family present at bedside Disposition Plan: To be determined Consults called: Neurology Admission status: Inpatient  Clydie Braun MD [facility] Pager [phone]   If 7PM-7AM, please contact night-coverage [email or web address] Password [email or web address]  08/17/2018, 10:09 AM

## 2018-08-17 NOTE — Progress Notes (Signed)
Spoke with patient and RN about CPAP. RN will make sure I am contacted when patient has had all his meds and is ready for night time rest.

## 2018-08-17 NOTE — ED Notes (Signed)
ED TO INPATIENT HANDOFF REPORT  ED Nurse Name and Phone #: Britta Mccreedy   S Name/Age/Gender Bradley [name] 43 y.o. male Room/Bed: 031C/031C  Code Status   Code Status: Full Code  Home/SNF/Other Home Patient oriented to: self, place, time and situation Is this baseline? Yes   Triage Complete: Triage complete  Chief Complaint sinus infection   Triage Note Pt c/o sinus infection and facial pain x 2 days.    Allergies Allergies  Allergen Reactions  . Penicillins Anaphylaxis and Other (See Comments)    Has patient had a PCN reaction causing immediate rash, facial/tongue/throat swelling, SOB or lightheadedness with hypotension: Yes Has patient had a PCN reaction causing severe rash involving mucus membranes or skin necrosis: Yes Has patient had a PCN reaction that required hospitalization Yes Has patient had a PCN reaction occurring within the last 10 years: Unknown If all of the above answers are "NO", then may proceed with Cephalosporin use.  . Wellbutrin [Bupropion] Rash    Level of Care/Admitting Diagnosis ED Disposition    ED Disposition Condition Comment   Admit  Hospital Area: [facility] [[id number]]  Level of Care: Telemetry Medical [104]  Covid Evaluation: Screening Protocol (No Symptoms)  Diagnosis: CVA (cerebral vascular accident) [facility]) [[id number]]  Admitting Physician: Clydie Braun [[id number]]  Attending Physician: Clydie Braun [[id number]]  Estimated length of stay: past midnight tomorrow  Certification:: I certify this patient will need inpatient services for at least 2 midnights  PT Class (Do Not Modify): Inpatient [101]  PT Acc Code (Do Not Modify): Private [1]       B Medical/Surgery History Past Medical History:  Diagnosis Date  . Arthritis   . Asthma    Hx: of as a child  . Back pain    Hx: of back injury  . Family history of adverse reaction to anesthesia    Mother- awoke during leg amputation, per mother  .  Headache(784.0)   . Hypertension   . Renal disorder    ESRD  . Sleep apnea    does not wear CPAP   Past Surgical History:  Procedure Laterality Date  . APPENDECTOMY    . AV FISTULA PLACEMENT Left 09/20/2014   Procedure: INSERTION OF 4-7MM X 45CM STRETCH ARTERIOVENOUS (AV) GORE-TEX GRAFT, LEFT FOREARM, BRACHIAL ARTERY TO BRACHIAL VEIN;  Surgeon: Pryor Ochoa, MD;  Location: [facility] OR;  Service: Vascular;  Laterality: Left;  . AV FISTULA PLACEMENT Right 09/13/2015   Procedure: INSERTION OF ARTERIOVENOUS (AV) GORE-TEX GRAFT ARM-RIGHT UPPER ARM; IMMEDIATE STICK GRAFT;  Surgeon: Chuck Hint, MD;  Location: [facility] OR;  Service: Vascular;  Laterality: Right;  . AV FISTULA PLACEMENT Right 12/04/2015   Procedure: INSERTION OF ARTERIOVENOUS (AV) GORE-TEX GRAFT THIGH;  Surgeon: Chuck Hint, MD;  Location: [facility] OR;  Service: Vascular;  Laterality: Right;  . AVF   Left    Undone  . AVF inserted Left    upper  . BASCILIC VEIN TRANSPOSITION Right 07/22/2012   Procedure: BASCILIC VEIN TRANSPOSITION;  Surgeon: Chuck Hint, MD;  Location: [facility] OR;  Service: Vascular;  Laterality: Right;  . BASCILIC VEIN TRANSPOSITION Left 09/05/2014   Procedure: BASCILIC VEIN TRANSPOSITION-LEFT;  Surgeon: Sherren Kerns, MD;  Location: [facility] OR;  Service: Vascular;  Laterality: Left;  . DG AV DIALYSIS  SHUNT ACCESS EXIST*L* OR     left- 07/2014  . EXCHANGE OF A DIALYSIS CATHETER Left 12/04/2015   Procedure: EXCHANGE OF A DIALYSIS CATHETER;  Surgeon: [name], MD;  [facility];  Service: Vascular;  Laterality: Left;  . LIGATION OF ARTERIOVENOUS  FISTULA Left 09/20/2014   Procedure: LIGATION OF ARTERIOVENOUS  FISTULA, LEFT UPPER ARM;  Surgeon: Pryor OchoaJames D Lawson, MD;  [facility];  Service: Vascular;  Laterality: Left;  . NEPHRECTOMY TRANSPLANTED ORGAN    . PERIPHERAL VASCULAR CATHETERIZATION Right 10/01/2015   Procedure: Upper Extremity Venography;  Surgeon: [name], MD;   [facility];  Service: Cardiovascular;  Laterality: Right;  . THROMBECTOMY W/ EMBOLECTOMY Left 09/20/2014   Procedure: ATTEMPTED THROMBECTOMY LEFT BASILIC VEIN TRANSPOSITION ;  Surgeon: Pryor OchoaJames D Lawson, MD;  [facility];  Service: Vascular;  Laterality: Left;     A IV Location/Drains/Wounds Patient Lines/Drains/Airways Status   Active Line/Drains/Airways    Name:   Placement date:   Placement time:   Site:   Days:   Peripheral IV 08/17/18 Right Antecubital   08/17/18    0741    Antecubital   less than 1   Fistula / Graft Left Upper arm Arteriovenous fistula   -    -    Upper arm      Fistula / Graft Left Forearm Arteriovenous vein graft   09/20/14    1206    Forearm   1427   Fistula / Graft Right Upper arm Arteriovenous vein graft   09/13/15    1128    Upper arm   1069   Fistula / Graft Right Thigh Arteriovenous fistula   12/04/15    1227    Thigh   987   Hemodialysis Catheter Left Internal jugular Double-lumen   -    -    Internal jugular      Hemodialysis Catheter Left Internal jugular Permanent;Double-lumen   12/04/15    0803    Internal jugular   987   Incision (Closed) 09/20/14 Arm Left   09/20/14    1219     1427   Incision (Closed) 09/13/15 Arm Right   09/13/15    1126     1069          Intake/Output Last 24 hours  Intake/Output Summary (Last 24 hours) at 08/17/2018 1051 Last data filed at 08/17/2018 [id number] Gross per 24 hour  Intake 1100 ml  Output -  Net 1100 ml    Labs/Imaging Results for orders placed or performed during the hospital encounter of 08/17/18 (from the past 48 hour(s))  CBC with Differential     Status: Abnormal   Collection Time: 08/17/18  7:50 AM  Result Value Ref Range   WBC 6.3 4.0 - 10.5 K/uL   RBC 5.52 4.22 - 5.81 MIL/uL   Hemoglobin 14.9 13.0 - 17.0 g/dL   HCT 13.048.7 86.539.0 - 78.452.0 %   MCV 88.2 80.0 - 100.0 fL   MCH 27.0 26.0 - 34.0 pg   MCHC 30.6 30.0 - 36.0 g/dL   RDW 69.615.0 29.511.5 - 28.415.5 %   Platelets 181 150 - 400 K/uL   nRBC 0.0  0.0 - 0.2 %   Neutrophils Relative % 77 %   Neutro Abs 4.8 1.7 - 7.7 K/uL   Lymphocytes Relative 10 %   Lymphs Abs 0.6 (L) 0.7 - 4.0 K/uL   Monocytes Relative 9 %   Monocytes Absolute 0.6 0.1 - 1.0 K/uL   Eosinophils Relative 4 %   Eosinophils Absolute 0.3 0.0 - 0.5 K/uL   Basophils Relative 0 %   Basophils Absolute  0.0 0.0 - 0.1 K/uL   Immature Granulocytes 0 %   Abs Immature Granulocytes 0.02 0.00 - 0.07 K/uL    Comment: Performed at [facility], [address], [place], [place] [address]  Basic metabolic panel     Status: Abnormal   Collection Time: 08/17/18  7:50 AM  Result Value Ref Range   Sodium 139 135 - 145 mmol/L   Potassium 4.1 3.5 - 5.1 mmol/L   Chloride 108 98 - 111 mmol/L   CO2 24 22 - 32 mmol/L   Glucose, Bld 131 (H) 70 - 99 mg/dL   BUN 16 6 - 20 mg/dL   Creatinine, Ser 1.32 (H) 0.61 - 1.24 mg/dL   Calcium 44.0 8.9 - 10.2 mg/dL   GFR calc non Af Amer 58 (L) >60 mL/min   GFR calc Af Amer >60 >60 mL/min   Anion gap 7 5 - 15    Comment: Performed at [facility], [address] [place], [place] [address]  Protime-INR     Status: None   Collection Time: 08/17/18  9:01 AM  Result Value Ref Range   Prothrombin Time 13.6 11.4 - 15.2 seconds   INR 1.1 0.8 - 1.2    Comment: (NOTE) INR goal varies based on device and disease states. Performed at [facility], [address], [place], [place] [address]   Troponin I - ONCE - STAT     Status: None   Collection Time: 08/17/18  9:01 AM  Result Value Ref Range   Troponin I <0.03 <0.03 ng/mL    Comment: Performed at [facility], [address], [place], [place] [address]   Ct Head Wo Contrast  Result Date: 08/17/2018 CLINICAL DATA:  Headache for 2 days, no nausea or vomiting. History of hypertension. History of end-stage renal disease. EXAM: CT HEAD WITHOUT CONTRAST TECHNIQUE: Contiguous axial images were obtained from the base of the skull through the vertex without intravenous contrast.  COMPARISON:  None. FINDINGS: Brain: There is a well-defined area of hypoattenuation in the LEFT inferior cerebellum, corresponding to much of the LEFT posterior inferior cerebellar artery territory, consistent with acute infarction. Slight mass effect on the fourth ventricle, and slight LEFT-to-RIGHT shift, without visible hemorrhage. Elsewhere, no similar areas of hypoattenuation. No hydrocephalus, extra-axial fluid, or mass lesion. Vascular: Calcification of the cavernous internal carotid arteries consistent with cerebrovascular atherosclerotic disease. No signs of intracranial large vessel occlusion. Skull: Normal. Negative for fracture or focal lesion. Sinuses/Orbits: No acute finding. Other: None. 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 Slight mass effect on the fourth ventricle and slight RIGHT-to-LEFT shift. Close clinical observation is warranted. These results were called by telephone at the time of interpretation on 08/17/2018 at 8:40 am to Dr. Shaune Pollack , who verbally acknowledged these results. Electronically Signed   By: Elsie Stain M.D.   On: 08/17/2018 08:41    Pending Labs Unresulted Labs (From admission, onward)    Start     Ordered   08/18/18 0500  HIV antibody (Routine Testing)  Tomorrow morning,   R     08/17/18 1022   08/18/18 0500  Hemoglobin A1c  Tomorrow morning,   R     08/17/18 1022   08/18/18 0500  Lipid panel  Tomorrow morning,   R    Comments:  Fasting    08/17/18 1022   08/17/18 1044  SARS Coronavirus 2 (CEPHEID - Performed in [facility]  lab), Hosp Order  (Asymptomatic Patients Labs)  Once,   R    Question:  Rule Out  Answer:  Yes   08/17/18 1043          Vitals/Pain Today's Vitals   08/17/18 0930 08/17/18 0945 08/17/18 1000 08/17/18 1015  BP: (!) 187/98 (!) 180/101 (!) 192/92 (!) 201/110  Pulse: 72 69 69 72  Resp: (!) 21 (!) 24 (!) 21 (!) 25  Temp:      TempSrc:      SpO2: 95% 98%  96% 99%  Weight:      Height:      PainSc:        Isolation Precautions No active isolations  Medications Medications  Oxycodone HCl TABS 10 mg (has no administration in time range)  cinacalcet (SENSIPAR) tablet 90 mg (has no administration in time range)  predniSONE (DELTASONE) tablet 5 mg (has no administration in time range)  magnesium oxide (MAG-OX) tablet 800 mg (has no administration in time range)  mycophenolate (MYFORTIC) EC tablet 720 mg (has no administration in time range)  tacrolimus (PROGRAF) capsule 5 mg (has no administration in time range)   stroke: mapping our early stages of recovery book (has no administration in time range)  acetaminophen (TYLENOL) tablet 650 mg (has no administration in time range)    Or  acetaminophen (TYLENOL) solution 650 mg (has no administration in time range)    Or  acetaminophen (TYLENOL) suppository 650 mg (has no administration in time range)  senna-docusate (Senokot-S) tablet 1 tablet (has no administration in time range)  enoxaparin (LOVENOX) injection 40 mg (has no administration in time range)  atorvastatin (LIPITOR) tablet 80 mg (has no administration in time range)  aspirin EC tablet 325 mg (has no administration in time range)  acetaminophen (TYLENOL) tablet 1,000 mg (1,000 mg Oral Given 08/17/18 0617)  prochlorperazine (COMPAZINE) injection 10 mg (10 mg Intramuscular Given 08/17/18 0617)  0.9 %  sodium chloride infusion ( Intravenous Stopped 08/17/18 0927)  labetalol (NORMODYNE) injection 20 mg (20 mg Intravenous Given 08/17/18 0745)  metoCLOPramide (REGLAN) injection 10 mg (10 mg Intravenous Given 08/17/18 0744)  diphenhydrAMINE (BENADRYL) injection 25 mg (25 mg Intravenous Given 08/17/18 0742)  magnesium sulfate IVPB 1 g 100 mL (0 g Intravenous Stopped 08/17/18 8366)    Mobility walks Low fall risk   Focused Assessments Neuro Assessment Handoff:  Swallow screen pass? Yes    NIH Stroke Scale ( + Modified Stroke Scale  Criteria)  LOC Questions (1b. )   +: Answers both questions correctly LOC Commands (1c. )   + : Performs both tasks correctly Best Gaze (2. )  +: Normal Visual (3. )  +: No visual loss Motor Arm, Left (5a. )   +: No drift Motor Arm, Right (5b. )   +: No drift Motor Leg, Left (6a. )   +: No drift Motor Leg, Right (6b. )   +: No drift Sensory (8. )   +: Normal, no sensory loss Best Language (9. )   +: No aphasia Extinction/Inattention (11.)   +: No Abnormality Modified SS Total  +: 0     Neuro Assessment:   Neuro Checks:      Last Documented NIHSS Modified Score: 0 (08/17/18 1010) Has TPA been given? No If patient is a Neuro Trauma and patient is going to OR before floor call report to 4N Charge nurse: [phone] or [phone]     R Recommendations: See Admitting Provider Note  Report given to:  Additional Notes:

## 2018-08-17 NOTE — ED Notes (Signed)
Alert and oriented states pain a ten prior to meds.  BP as noted.  Pt relaxed quickly after meds given.  Pain a 8

## 2018-08-17 NOTE — Progress Notes (Signed)
Set patient up on nasal CPAP.  Patient tolerating well at this time.  RT will continue to monitor.

## 2018-08-17 NOTE — ED Notes (Signed)
Call to contact phone number- [phone]  No answer at this time.  IV team here .

## 2018-08-17 NOTE — Consult Note (Addendum)
Referring Physician: Dr. Katrinka Blazing    Chief Complaint: Left cerebellar infarct  HPI: Bradley [name] is an 43 y.o. male  with history of stroke, sleep apnea, renal disease, hypertension, headaches, asthma.  Patient states that on 08/15/2018 at approximately 3 AM patient awoke and noted that he had a headache and he did not feel right.  At that time he tried to get up and felt off balance.  He did not notice any other symptoms at that time.  He did notice some neck discomfort later on Sunday.  Patient states he had no other changes in symptoms on Monday however this morning he did wake up with a significantly, more prominent headache that was both in the occipital and the frontal area of his head.  He stated that the imbalance would come and go.  He denied having any appendicular dysmetria or difficulty grabbing his fork, spoon or drinks.  He denied any visual changes such as double vision or blurred vision.  He denied any weakness in any extremity.  He denied any numbness in any extremity.  He denied any aphasia or dysarthria.   LSN: 08/15/2018 tPA Given: No: Out of window NIH stroke scale of 0   Past Medical History:  Diagnosis Date  . Arthritis   . Asthma    Hx: of as a child  . Back pain    Hx: of back injury  . Family history of adverse reaction to anesthesia    Mother- awoke during leg amputation, per mother  . Headache(784.0)   . Hypertension   . Renal disorder    ESRD  . Sleep apnea    does not wear CPAP    Past Surgical History:  Procedure Laterality Date  . APPENDECTOMY    . AV FISTULA PLACEMENT Left 09/20/2014   Procedure: INSERTION OF 4-7MM X 45CM STRETCH ARTERIOVENOUS (AV) GORE-TEX GRAFT, LEFT FOREARM, BRACHIAL ARTERY TO BRACHIAL VEIN;  Surgeon: Pryor Ochoa, MD;  Location: [facility] OR;  Service: Vascular;  Laterality: Left;  . AV FISTULA PLACEMENT Right 09/13/2015   Procedure: INSERTION OF ARTERIOVENOUS (AV) GORE-TEX GRAFT ARM-RIGHT UPPER ARM; IMMEDIATE STICK GRAFT;  Surgeon:  Chuck Hint, MD;  Location: [facility] OR;  Service: Vascular;  Laterality: Right;  . AV FISTULA PLACEMENT Right 12/04/2015   Procedure: INSERTION OF ARTERIOVENOUS (AV) GORE-TEX GRAFT THIGH;  Surgeon: Chuck Hint, MD;  Location: [facility] OR;  Service: Vascular;  Laterality: Right;  . AVF   Left    Undone  . AVF inserted Left    upper  . BASCILIC VEIN TRANSPOSITION Right 07/22/2012   Procedure: BASCILIC VEIN TRANSPOSITION;  Surgeon: Chuck Hint, MD;  Location: [facility] OR;  Service: Vascular;  Laterality: Right;  . BASCILIC VEIN TRANSPOSITION Left 09/05/2014   Procedure: BASCILIC VEIN TRANSPOSITION-LEFT;  Surgeon: Sherren Kerns, MD;  Location: [facility] OR;  Service: Vascular;  Laterality: Left;  . DG AV DIALYSIS  SHUNT ACCESS EXIST*L* OR     left- 07/2014  . EXCHANGE OF A DIALYSIS CATHETER Left 12/04/2015   Procedure: EXCHANGE OF A DIALYSIS CATHETER;  Surgeon: Chuck Hint, MD;  Location: [facility] OR;  Service: Vascular;  Laterality: Left;  . LIGATION OF ARTERIOVENOUS  FISTULA Left 09/20/2014   Procedure: LIGATION OF ARTERIOVENOUS  FISTULA, LEFT UPPER ARM;  Surgeon: Pryor Ochoa, MD;  Location: [facility] OR;  Service: Vascular;  Laterality: Left;  . NEPHRECTOMY TRANSPLANTED ORGAN    . PERIPHERAL VASCULAR CATHETERIZATION Right 10/01/2015   Procedure: Upper Extremity Venography;  Surgeon: Chuck Hint, MD;  Location: [facility] INVASIVE CV LAB;  Service: Cardiovascular;  Laterality: Right;  . THROMBECTOMY W/ EMBOLECTOMY Left 09/20/2014   Procedure: ATTEMPTED THROMBECTOMY LEFT BASILIC VEIN TRANSPOSITION ;  Surgeon: Pryor Ochoa, MD;  Location: [facility] OR;  Service: Vascular;  Laterality: Left;    Family History  Problem Relation Age of Onset  . Hypertension Mother   . Peripheral vascular disease Mother   . Diabetes Father   . Hyperlipidemia Father   . Hypertension Father   . Peripheral vascular disease Maternal Grandmother    Social History:  reports that he has quit smoking. His smoking use  included cigarettes. He has a 7.50 pack-year smoking history. He has never used smokeless tobacco. He reports current alcohol use of about 1.0 standard drinks of alcohol per week. He reports that he does not use drugs.  Allergies:  Allergies  Allergen Reactions  . Penicillins Anaphylaxis and Other (See Comments)    Has patient had a PCN reaction causing immediate rash, facial/tongue/throat swelling, SOB or lightheadedness with hypotension: Yes Has patient had a PCN reaction causing severe rash involving mucus membranes or skin necrosis: Yes Has patient had a PCN reaction that required hospitalization Yes Has patient had a PCN reaction occurring within the last 10 years: Unknown If all of the above answers are "NO", then may proceed with Cephalosporin use.  . Wellbutrin [Bupropion] Rash    Medications:  Prior to Admission:  Medications Prior to Admission  Medication Sig Dispense Refill Last Dose  . allopurinol (ZYLOPRIM) 300 MG tablet Take 300 mg by mouth daily.   08/16/2018 at Unknown time  . cinacalcet (SENSIPAR) 90 MG tablet Take 90 mg by mouth See admin instructions. Mon, Wed, Fri   08/13/2018  . furosemide (LASIX) 20 MG tablet Take 40 mg by mouth daily.  6 08/15/2018  . labetalol (NORMODYNE) 300 MG tablet Take 300 mg by mouth 2 (two) times daily.  3 08/16/2018 at Unknown time  . loperamide (IMODIUM) 2 MG capsule Take 2 capsules (4 mg total) by mouth 4 (four) times daily as needed for diarrhea or loose stools. 20 capsule 1 unknown at prn  . magnesium oxide (MAG-OX) 400 MG tablet Take 800 mg by mouth 2 (two) times daily.   6 08/16/2018 at Unknown time  . mycophenolate (MYFORTIC) 180 MG EC tablet Take 720 mg by mouth 2 (two) times daily.   6 08/16/2018 at Unknown time  . Oxycodone HCl 10 MG TABS Take 10 mg by mouth 3 (three) times daily as needed for pain.  0 Past Week at prn  . predniSONE (DELTASONE) 5 MG tablet Take 5 mg by mouth daily.  6 08/16/2018 at Unknown time  .  sulfamethoxazole-trimethoprim (BACTRIM,SEPTRA) 400-80 MG tablet Take 1 tablet by mouth See admin instructions. Monday, wed, fri  6 08/13/2018  . tacrolimus (PROGRAF) 1 MG capsule Take 5 mg by mouth 2 (two) times daily.   08/16/2018 at Unknown time  . Vitamin D, Ergocalciferol, (DRISDOL) 1.25 MG (50000 UT) CAPS capsule Take 50,000 Units by mouth once a week. Wednesday   08/11/2018  . cyclobenzaprine (FLEXERIL) 10 MG tablet Take 1 tablet (10 mg total) by mouth 2 (two) times daily as needed for muscle spasms. (Patient not taking: Reported on 09/04/2017) 20 tablet 0 Not Taking at Unknown time  . oxyCODONE-acetaminophen (PERCOCET) 5-325 MG tablet Take 1-2 tablets by mouth every 4 (four) hours as needed. (Patient not taking: Reported on 08/17/2018) 15 tablet 0 Not Taking at  Unknown time  . traMADol (ULTRAM) 50 MG tablet Take 1 tablet (50 mg total) by mouth every 6 (six) hours as needed. (Patient not taking: Reported on 09/04/2017) 15 tablet 0 Not Taking at Unknown time   Scheduled: . aspirin EC  325 mg Oral Daily  . atorvastatin  80 mg Oral q1800  . [START ON 08/18/2018] cinacalcet  90 mg Oral Q M,W,F  . enoxaparin (LOVENOX) injection  40 mg Subcutaneous Q24H  . labetalol  300 mg Oral BID  . magnesium oxide  800 mg Oral BID  . mycophenolate  720 mg Oral BID  . predniSONE  5 mg Oral Daily  . tacrolimus  5 mg Oral BID    ROS: A 14 point ROS was performed and is negative except as noted in the HPI  Physical Examination: Blood pressure (!) 201/102, pulse 67, temperature 98.7 F (37.1 C), temperature source Oral, resp. rate 19, height 6\' 1"  (1.854 m), weight (!) 184.6 kg, SpO2 94 %.   Physical Exam  Constitutional: Supermorbid obesity.  Psych: Affect appropriate to situation Eyes: No scleral injection HENT: No OP obstrucion Head: Normocephalic.  Cardiovascular: Normal rate and regular rhythm.  Respiratory: Effort normal, non-labored breathing GI: Soft.  No distension. There is no tenderness.  Skin:  WDI  Neuro: Mental Status: Patient is awake, alert, oriented to person, place, month, year, and situation. Patient is able to give a clear and coherent history. No signs of aphasia or neglect Cranial Nerves: II: Visual Fields are full.  III,IV, VI: EOMI without ptosis or diploplia. Pupils equal, round and reactive to light V: Facial sensation is symmetric to temperature VII: Facial movement is symmetric.  VIII: hearing is intact to voice X: Palat elevates symmetrically XI: Shoulder shrug is symmetric. XII: tongue is midline without atrophy or fasciculations.  Motor: Tone is normal. Bulk is normal. 5/5 strength was present in all four extremities.  Sensory: Sensation is symmetric to light touch and temperature in the arms and legs. Deep Tendon Reflexes: 2+ and symmetric in the biceps and patellae.  Plantars: Toes are downgoing bilaterally.  Cerebellar: Right FNF and RAM are normal. Left FNF and RAM are ataxic/dysmetric. H-S without significant abnormality bilaterally.   Results for orders placed or performed during the hospital encounter of 08/17/18 (from the past 48 hour(s))  CBC with Differential     Status: Abnormal   Collection Time: 08/17/18  7:50 AM  Result Value Ref Range   WBC 6.3 4.0 - 10.5 K/uL   RBC 5.52 4.22 - 5.81 MIL/uL   Hemoglobin 14.9 13.0 - 17.0 g/dL   HCT 16.148.7 09.639.0 - 04.552.0 %   MCV 88.2 80.0 - 100.0 fL   MCH 27.0 26.0 - 34.0 pg   MCHC 30.6 30.0 - 36.0 g/dL   RDW 40.915.0 81.111.5 - 91.415.5 %   Platelets 181 150 - 400 K/uL   nRBC 0.0 0.0 - 0.2 %   Neutrophils Relative % 77 %   Neutro Abs 4.8 1.7 - 7.7 K/uL   Lymphocytes Relative 10 %   Lymphs Abs 0.6 (L) 0.7 - 4.0 K/uL   Monocytes Relative 9 %   Monocytes Absolute 0.6 0.1 - 1.0 K/uL   Eosinophils Relative 4 %   Eosinophils Absolute 0.3 0.0 - 0.5 K/uL   Basophils Relative 0 %   Basophils Absolute 0.0 0.0 - 0.1 K/uL   Immature Granulocytes 0 %   Abs Immature Granulocytes 0.02 0.00 - 0.07 K/uL    Comment:  Performed at [facility], [address] [place], [place] [address]  Basic metabolic panel     Status: Abnormal   Collection Time: 08/17/18  7:50 AM  Result Value Ref Range   Sodium 139 135 - 145 mmol/L   Potassium 4.1 3.5 - 5.1 mmol/L   Chloride 108 98 - 111 mmol/L   CO2 24 22 - 32 mmol/L   Glucose, Bld 131 (H) 70 - 99 mg/dL   BUN 16 6 - 20 mg/dL   Creatinine, Ser 6.38 (H) 0.61 - 1.24 mg/dL   Calcium 45.3 8.9 - 64.6 mg/dL   GFR calc non Af Amer 58 (L) >60 mL/min   GFR calc Af Amer >60 >60 mL/min   Anion gap 7 5 - 15    Comment: Performed at [facility], [address] [place], [place] [address]   Ct Head Wo Contrast Result Date: 08/17/2018  IMPRESSION: Acute LEFT posterior inferior cerebellar artery territory infarct, without hemorrhage. Slight mass effect on the fourth ventricle and slight RIGHT-to-LEFT shift. Close clinical observation is warranted. These results were called by telephone at the time of interpretation on 08/17/2018 at 8:40 am to Dr. Shaune Pollack , who verbally acknowledged these results. Electronically Signed   By: Elsie Stain M.D.   On: 08/17/2018 08:41   MRI/MRA of brain: IMPRESSION: Acute left posterior inferior cerebellar artery territory infarction affecting the inferior 1/3 to 1/2 of the left cerebellum. Swelling but no evidence of hemorrhage. No evidence of ventricular obstruction at this moment. Brain otherwise appears normal.  MRA shows a normal appearing anterior circulation and a normal appearing right vertebral artery. Left vertebral artery is small and somewhat irregular. There is minimal flow visible within a left PICA based on the source images.  Assessment: 43 y.o. male presenting with a large subacute left cerebellar infarct affecting 1/3-1/2 of the left cerebellar hemisphere.  There is swelling with slight mass-effect on the fourth ventricle.   1. Exam reveals LUE ataxia. No other neurological signs present on exam.  2. Subjectively  he is doing well. He has not noticed any problems with coordination on his left side.  3. He is almost out of the 72-hour time window during which swelling would be most concerning. After 7 days have elapsed, risk of further mass effect with herniation would be low.  3. Due to patient is doing so well, at this point we will keep him on the neuro stepdown and have frequent neuro checks and monitoring. 4. Stroke Risk Factors - hypertension, smoking and supermorbid obesity  Plan: 1. HgbA1c, fasting lipid panel 2. CT of head in the morning. Due to renal disease would avoid CTA. Cannot obtain MRA due to large body size.  3. PT consult, OT consult, Speech consult 4. Echocardiogram 5. Carotid dopplers 6. Prophylactic therapy-Antiplatelet med: Aspirin - dose 325 7. Risk factor modification 8. Telemetry monitoring 9. Frequent neuro checks   Felicie Morn PA-C [facility] [phone]  I have interviewed and examined the patient. 43 year old male with subacute large left cerebellar ischemic infarction. Approximately 48 hours have elapsed since onset of symptoms. Time window of greatest concern for herniation is 72 hours, with some risk persisting through 7 day time window for most patients. Frequent neuro checks. Will obtain repeat CT head in the morning. ASA has been started. Weight loss and smoking cessation have been discussed with the patient.  Electronically signed: Dr. Caryl Pina 08/17/2018, 3:17 PM  ,

## 2018-08-17 NOTE — ED Triage Notes (Signed)
Pt c/o sinus infection and facial pain x 2 days.

## 2018-08-17 NOTE — Progress Notes (Signed)
PT Cancellation Note  Patient Details Name: Bradley [name] MRN: [id number] DOB: 1975/05/26   Cancelled Treatment:    Reason Eval/Treat Not Completed: Patient not medically ready Per RN Raynelle Fanning, holding PT evaluation as pt potentially being transferred to ICU due possible occlusion. Will follow.   [name] 08/17/2018, 2:54 PM Mylo Red, PT, DPT Acute Rehabilitation Services Pager [phone] Office [phone]

## 2018-08-17 NOTE — ED Notes (Signed)
Pt c/o pain to forehead and under eyes for 2 days, pt used otc sinus relief remedies without relief. Denies blurred vision, denies n/v/d, denies fever.  Pt denies photo or sound sensitivity.  A & O, no neuro deficits.

## 2018-08-17 NOTE — Evaluation (Signed)
Speech Language Pathology Evaluation Patient Details Name: Bradley [name] MRN: [id number] DOB: 04-Nov-1975 Today's Date: 08/17/2018 Time: 5537-4827 SLP Time Calculation (min) (ACUTE ONLY): 33 min  Problem List:  Patient Active Problem List   Diagnosis Date Noted  . CVA (cerebral vascular accident) (HCC) 08/17/2018  . ESRD (end stage renal disease) (HCC) 12/04/2015  . Dialysis AV fistula malfunction (HCC)   . Aftercare following surgery of the circulatory system, NEC 09/08/2012  . End stage renal disease (HCC) 09/08/2012   Past Medical History:  Past Medical History:  Diagnosis Date  . Arthritis   . Asthma    Hx: of as a child  . Back pain    Hx: of back injury  . Family history of adverse reaction to anesthesia    Mother- awoke during leg amputation, per mother  . Headache(784.0)   . Hypertension   . Renal disorder    ESRD  . Sleep apnea    does not wear CPAP  . Stroke [facility]) 07/2018   Past Surgical History:  Past Surgical History:  Procedure Laterality Date  . APPENDECTOMY    . AV FISTULA PLACEMENT Left 09/20/2014   Procedure: INSERTION OF 4-7MM X 45CM STRETCH ARTERIOVENOUS (AV) GORE-TEX GRAFT, LEFT FOREARM, BRACHIAL ARTERY TO BRACHIAL VEIN;  Surgeon: Pryor Ochoa, MD;  Location: [facility] OR;  Service: Vascular;  Laterality: Left;  . AV FISTULA PLACEMENT Right 09/13/2015   Procedure: INSERTION OF ARTERIOVENOUS (AV) GORE-TEX GRAFT ARM-RIGHT UPPER ARM; IMMEDIATE STICK GRAFT;  Surgeon: Chuck Hint, MD;  Location: [facility] OR;  Service: Vascular;  Laterality: Right;  . AV FISTULA PLACEMENT Right 12/04/2015   Procedure: INSERTION OF ARTERIOVENOUS (AV) GORE-TEX GRAFT THIGH;  Surgeon: Chuck Hint, MD;  Location: [facility] OR;  Service: Vascular;  Laterality: Right;  . AVF   Left    Undone  . AVF inserted Left    upper  . BASCILIC VEIN TRANSPOSITION Right 07/22/2012   Procedure: BASCILIC VEIN TRANSPOSITION;  Surgeon: Chuck Hint, MD;  Location: [facility] OR;  Service:  Vascular;  Laterality: Right;  . BASCILIC VEIN TRANSPOSITION Left 09/05/2014   Procedure: BASCILIC VEIN TRANSPOSITION-LEFT;  Surgeon: Sherren Kerns, MD;  Location: [facility] OR;  Service: Vascular;  Laterality: Left;  . DG AV DIALYSIS  SHUNT ACCESS EXIST*L* OR     left- 07/2014  . EXCHANGE OF A DIALYSIS CATHETER Left 12/04/2015   Procedure: EXCHANGE OF A DIALYSIS CATHETER;  Surgeon: Chuck Hint, MD;  Location: [facility] OR;  Service: Vascular;  Laterality: Left;  . LIGATION OF ARTERIOVENOUS  FISTULA Left 09/20/2014   Procedure: LIGATION OF ARTERIOVENOUS  FISTULA, LEFT UPPER ARM;  Surgeon: Pryor Ochoa, MD;  Location: [facility] OR;  Service: Vascular;  Laterality: Left;  . NEPHRECTOMY TRANSPLANTED ORGAN    . PERIPHERAL VASCULAR CATHETERIZATION Right 10/01/2015   Procedure: Upper Extremity Venography;  Surgeon: Chuck Hint, MD;  Location: [facility] INVASIVE CV LAB;  Service: Cardiovascular;  Laterality: Right;  . THROMBECTOMY W/ EMBOLECTOMY Left 09/20/2014   Procedure: ATTEMPTED THROMBECTOMY LEFT BASILIC VEIN TRANSPOSITION ;  Surgeon: Pryor Ochoa, MD;  Location: [facility] OR;  Service: Vascular;  Laterality: Left;   HPI:  Pt is a 43 y.o. male with a history of ESRD status post renal transplant who presented to the emergency department with 2 days of gradually worsening frontal and occipital headache described as aching pain. CT of the head revealed acute LEFT posterior inferior cerebellar artery territory infarct, without hemorrhage. Slight mass effect on the fourth ventricle  and slight RIGHT-to-LEFT shift.    Assessment / Plan / Recommendation Clinical Impression  Pt reported that he did not complete high school but sucessfully completed the GED test subsequently. 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 He denied any changes in these areas since admission and was able to review his home  medications with the the RN at the onset of the evaluation. [facility].1 was completed to evaluate the pt's cognitive-linguistic skills. He achieved a score of 28/30 which is within the normal limits of 26 or more out of 30 and no speech/language deficits were demonstrated. Further skilled SLP services are not clinically indicated at this time. Pt and nursing were educated regarding results and recommendations; both parties verbalized understanding as well as agreement with plan of care.    SLP Assessment  SLP Recommendation/Assessment: Patient does not need any further Speech Lanaguage Pathology Services SLP Visit Diagnosis: Cognitive communication deficit (R41.841)    Follow Up Recommendations  None    Frequency and Duration           SLP Evaluation Cognition  Overall Cognitive Status: Within Functional Limits for tasks assessed Arousal/Alertness: Awake/alert Orientation Level: Oriented X4 Attention: Focused;Sustained Focused Attention: Appears intact(Vigilance WNL: 1/1) Sustained Attention: Appears intact(Serial 7s: 3/3) Memory: Appears intact Awareness: Appears intact(Immediate: 5/5; Delayed: 4/5; with cue: 1/1) Problem Solving: Appears intact(5/5) Executive Function: Sequencing;Reasoning;Organizing Reasoning: Appears intact Sequencing: Appears intact(Clock drawing: 3/3) Organizing: Appears intact       Comprehension  Auditory Comprehension Overall Auditory Comprehension: Appears within functional limits for tasks assessed Yes/No Questions: Within Functional Limits Commands: Within Functional Limits Complex Commands: (Trail completion: 1/1) Conversation: AdministratorComplex Visual Recognition/Discrimination Discrimination: Within Function Limits    Expression Expression Primary Mode of Expression: Verbal Verbal Expression Overall Verbal Expression: Appears within functional limits for tasks assessed Initiation: No impairment Level of  Generative/Spontaneous Verbalization: Conversation Repetition: No impairment Confrontation: Within functional limits(3/3) Divergent: (1/1) Pragmatics: No impairment Written Expression Dominant Hand: Right Written Expression: (DIfficulty copying cube: 0/1)   Oral / Motor  Oral Motor/Sensory Function Overall Oral Motor/Sensory Function: Within functional limits Motor Speech Overall Motor Speech: Appears within functional limits for tasks assessed Respiration: Within functional limits Phonation: Normal Resonance: Within functional limits Articulation: Within functional limitis Intelligibility: Intelligible Motor Planning: Witnin functional limits Motor Speech Errors: Not applicable   Ruqayyah Lute I. Vear ClockPhillips, MS, CCC-SLP Acute Rehabilitation Services Office number [phone] Pager [phone]                   Scheryl MartenShanika I Hanna Ra 08/17/2018, 3:15 PM

## 2018-08-17 NOTE — ED Provider Notes (Signed)
[facility]  CSN: [id number] Arrival date & time: 08/17/18 [id number]  Chief Complaint(s) Facial Pain  HPI Lelon PerlaHayward B Slinker is a 43 y.o. male with a history of ESRD status post renal transplant on Prograf and CellCept who presents to the emergency department with 2 days of gradually worsening frontal and occipital headache described as aching pain.  He reports that 2 days prior he was having shoulder and neck pain.  Headache is similar to his prior headaches but more intense.  There was no thunderclap onset.  Patient tried taking 800 mg of Motrin without relief.  He believes that this is related to sinus infection but he denies any nasal congestion, fevers, cough, chills.  No sick contacts.  Denies any visual disturbance.  No nuchal rigidity.  Pain exacerbated with movement and palpation of the neck muscles.  No other alleviating or aggravating factors.  HPI  Past Medical History Past Medical History:  Diagnosis Date  . Arthritis   . Asthma    Hx: of as a child  . Back pain    Hx: of back injury  . Family history of adverse reaction to anesthesia    Mother- awoke during leg amputation, per mother  . Headache(784.0)   . Hypertension   . Renal disorder    ESRD  . Sleep apnea    does not wear CPAP   Patient Active Problem List   Diagnosis Date Noted  . ESRD (end stage renal disease) (HCC) 12/04/2015  . Dialysis AV fistula malfunction (HCC)   . Aftercare following surgery of the circulatory system, NEC 09/08/2012  . End stage renal disease (HCC) 09/08/2012   Home Medication(s) Prior to Admission medications   Medication Sig Start Date End Date Taking? Authorizing Provider  cinacalcet (SENSIPAR) 90 MG tablet Take 90 mg by mouth See admin instructions. Mon, Wed, Fri    [provider]  cyclobenzaprine (FLEXERIL) 10 MG tablet Take 1 tablet (10 mg total) by mouth 2 (two) times daily as needed for muscle spasms. Patient not  taking: Reported on 09/04/2017 04/16/17   Fayrene Helperran, Bowie, PA-C  furosemide (LASIX) 20 MG tablet Take 40 mg by mouth daily. 08/11/17   [provider]  labetalol (NORMODYNE) 300 MG tablet Take 300 mg by mouth 2 (two) times daily. 07/22/17   [provider]  loperamide (IMODIUM) 2 MG capsule Take 2 capsules (4 mg total) by mouth 4 (four) times daily as needed for diarrhea or loose stools. Patient not taking: Reported on 09/04/2017 04/23/15   Linna HoffKindl, James D, MD  magnesium oxide (MAG-OX) 400 MG tablet Take 800 mg by mouth daily. 08/21/17   [provider]  mycophenolate (MYFORTIC) 180 MG EC tablet Take 720 mg by mouth 2 (two) times daily.  08/24/17   [provider]  Oxycodone HCl 10 MG TABS Take 10 mg by mouth 3 (three) times daily as needed for pain. 08/31/17   [provider]  oxyCODONE-acetaminophen (PERCOCET) 5-325 MG tablet Take 1-2 tablets by mouth every 4 (four) hours as needed. 09/04/17   Donnetta Hutchingook, Brian, MD  predniSONE (DELTASONE) 5 MG tablet Take 5 mg by mouth daily. 08/24/17   [provider]  sulfamethoxazole-trimethoprim (BACTRIM,SEPTRA) 400-80 MG tablet Take 1 tablet by mouth See admin instructions. Monday, wed, fri 08/21/17   [provider]  tacrolimus (PROGRAF) 1 MG capsule Take 5 mg by mouth 2 (two) times daily. 11/14/16   [provider]  traMADol (ULTRAM) 50 MG tablet  Take 1 tablet (50 mg total) by mouth every 6 (six) hours as needed. Patient not taking: Reported on 09/04/2017 09/10/16   Fayrene Helper, PA-C                                                                                                                                    Past Surgical History Past Surgical History:  Procedure Laterality Date  . APPENDECTOMY    . AV FISTULA PLACEMENT Left 09/20/2014   Procedure: INSERTION OF 4-7MM X 45CM STRETCH ARTERIOVENOUS (AV) GORE-TEX GRAFT, LEFT FOREARM, BRACHIAL ARTERY TO BRACHIAL VEIN;  Surgeon: Pryor Ochoa, MD;  Location: [facility]  OR;  Service: Vascular;  Laterality: Left;  . AV FISTULA PLACEMENT Right 09/13/2015   Procedure: INSERTION OF ARTERIOVENOUS (AV) GORE-TEX GRAFT ARM-RIGHT UPPER ARM; IMMEDIATE STICK GRAFT;  Surgeon: Chuck Hint, MD;  Location: [facility] OR;  Service: Vascular;  Laterality: Right;  . AV FISTULA PLACEMENT Right 12/04/2015   Procedure: INSERTION OF ARTERIOVENOUS (AV) GORE-TEX GRAFT THIGH;  Surgeon: Chuck Hint, MD;  Location: [facility] OR;  Service: Vascular;  Laterality: Right;  . AVF   Left    Undone  . AVF inserted Left    upper  . BASCILIC VEIN TRANSPOSITION Right 07/22/2012   Procedure: BASCILIC VEIN TRANSPOSITION;  Surgeon: Chuck Hint, MD;  Location: [facility] OR;  Service: Vascular;  Laterality: Right;  . BASCILIC VEIN TRANSPOSITION Left 09/05/2014   Procedure: BASCILIC VEIN TRANSPOSITION-LEFT;  Surgeon: Sherren Kerns, MD;  Location: [facility] OR;  Service: Vascular;  Laterality: Left;  . DG AV DIALYSIS  SHUNT ACCESS EXIST*L* OR     left- 07/2014  . EXCHANGE OF A DIALYSIS CATHETER Left 12/04/2015   Procedure: EXCHANGE OF A DIALYSIS CATHETER;  Surgeon: Chuck Hint, MD;  Location: [facility] OR;  Service: Vascular;  Laterality: Left;  . LIGATION OF ARTERIOVENOUS  FISTULA Left 09/20/2014   Procedure: LIGATION OF ARTERIOVENOUS  FISTULA, LEFT UPPER ARM;  Surgeon: Pryor Ochoa, MD;  Location: [facility] OR;  Service: Vascular;  Laterality: Left;  . NEPHRECTOMY TRANSPLANTED ORGAN    . PERIPHERAL VASCULAR CATHETERIZATION Right 10/01/2015   Procedure: Upper Extremity Venography;  Surgeon: Chuck Hint, MD;  Location: [facility] INVASIVE CV LAB;  Service: Cardiovascular;  Laterality: Right;  . THROMBECTOMY W/ EMBOLECTOMY Left 09/20/2014   Procedure: ATTEMPTED THROMBECTOMY LEFT BASILIC VEIN TRANSPOSITION ;  Surgeon: Pryor Ochoa, MD;  Location: [facility] OR;  Service: Vascular;  Laterality: Left;   Family History Family History  Problem Relation Age of Onset  . Hypertension Mother   . Peripheral vascular  disease Mother   . Diabetes Father   . Hyperlipidemia Father   . Hypertension Father   . Peripheral vascular disease Maternal Grandmother     Social History Social History   Tobacco Use  . Smoking status: Former Smoker    Packs/day: 0.50    Years: 15.00    Pack years:  7.50    Types: Cigarettes  . Smokeless tobacco: Never Used  Substance Use Topics  . Alcohol use: Yes    Alcohol/week: 1.0 standard drinks    Types: 1 Cans of beer per week    Comment: rare  . Drug use: No   Allergies Penicillins and Wellbutrin [bupropion]  Review of Systems Review of Systems All other systems are reviewed and are negative for acute change except as noted in the HPI  Physical Exam Vital Signs  I have reviewed the triage vital signs BP (!) 188/105 (BP Location: Right Arm)   Pulse 78   Temp 98.7 F (37.1 C) (Oral)   Resp 19   SpO2 100%   Physical Exam Vitals signs reviewed.  Constitutional:      General: He is not in acute distress.    Appearance: He is well-developed. He is obese. He is not diaphoretic.  HENT:     Head: Normocephalic and atraumatic.     Comments: Compressing of temporal regions improved pain    Nose: Nose normal. No mucosal edema or rhinorrhea.     Mouth/Throat:     Mouth: Mucous membranes are moist.     Pharynx: Oropharynx is clear.  Eyes:     General: No scleral icterus.       Right eye: No discharge.        Left eye: No discharge.     Conjunctiva/sclera: Conjunctivae normal.     Pupils: Pupils are equal, round, and reactive to light.  Neck:     Musculoskeletal: Normal range of motion and neck supple. Normal range of motion. Muscular tenderness present. No neck rigidity or spinous process tenderness.   Cardiovascular:     Rate and Rhythm: Normal rate and regular rhythm.     Heart sounds: No murmur. No friction rub. No gallop.   Pulmonary:     Effort: Pulmonary effort is normal. No respiratory distress.     Breath sounds: Normal breath sounds. No  stridor. No rales.  Abdominal:     General: There is no distension.     Palpations: Abdomen is soft.     Tenderness: There is no abdominal tenderness.  Musculoskeletal:        General: No tenderness.  Skin:    General: Skin is warm and dry.     Findings: No erythema or rash.  Neurological:     Mental Status: He is alert and oriented to person, place, and time.     ED Results and Treatments Labs (all labs ordered are listed, but only abnormal results are displayed) Labs Reviewed  CBC WITH DIFFERENTIAL/PLATELET  BASIC METABOLIC PANEL                                                                                                                         EKG  EKG Interpretation  Date/Time:    Ventricular Rate:    PR Interval:    QRS Duration:   QT Interval:  QTC Calculation:   R Axis:     Text Interpretation:        Radiology No results found. Pertinent labs & imaging results that were available during my care of the patient were reviewed by me and considered in my medical decision making (see chart for details).  Medications Ordered in ED Medications  0.9 %  sodium chloride infusion (has no administration in time range)  labetalol (NORMODYNE) injection 20 mg (has no administration in time range)  metoCLOPramide (REGLAN) injection 10 mg (has no administration in time range)  diphenhydrAMINE (BENADRYL) injection 25 mg (has no administration in time range)  magnesium sulfate IVPB 1 g 100 mL (has no administration in time range)  acetaminophen (TYLENOL) tablet 1,000 mg (1,000 mg Oral Given 08/17/18 0617)  prochlorperazine (COMPAZINE) injection 10 mg (10 mg Intramuscular Given 08/17/18 0617)                                                                                                                                    Procedures Procedures  (including critical care time)  Medical Decision Making / ED Course I have reviewed the nursing notes for this encounter and the  patient's prior records (if available in EHR or on provided paperwork).    Typical  headache for the pt. Non focal neuro exam. No recent head trauma. No fever. Doubt meningitis. Doubt intracranial bleed. Doubt IIH. No indication for imaging.  Feel pain is most consistent with muscular tension headache.  Head was wrapped with Coban which provided some relief.  Additionally patient was given Tylenol and Compazine.  Blood pressure improved with pain improvement.  On reassessment pain had returned and was severe.  On tactile touch, patient felt warm.  Will obtain rectal temp and screening labs.  We will also provide patient with labetalol for hypertension and additionally migraine cocktail.  Patient care turned over to Dr Erma Heritage at [facility]. Patient case and results discussed in detail; please see their note for further ED managment.     Final Clinical Impression(s) / ED Diagnoses Final diagnoses:  Bad headache      This chart was dictated using voice recognition software.  Despite best efforts to proofread,  errors can occur which can change the documentation meaning.   Nira Conn, MD 08/17/18 [phone]

## 2018-08-18 ENCOUNTER — Inpatient Hospital Stay (HOSPITAL_COMMUNITY): Payer: Medicare Other

## 2018-08-18 DIAGNOSIS — G936 Cerebral edema: Secondary | ICD-10-CM

## 2018-08-18 DIAGNOSIS — I639 Cerebral infarction, unspecified: Secondary | ICD-10-CM

## 2018-08-18 DIAGNOSIS — I63112 Cerebral infarction due to embolism of left vertebral artery: Secondary | ICD-10-CM

## 2018-08-18 LAB — ECHOCARDIOGRAM COMPLETE
Height: 73 in
Weight: 6512 oz

## 2018-08-18 LAB — HIV ANTIBODY (ROUTINE TESTING W REFLEX): HIV Screen 4th Generation wRfx: NONREACTIVE

## 2018-08-18 LAB — LIPID PANEL
Cholesterol: 187 mg/dL (ref 0–200)
HDL: 38 mg/dL — ABNORMAL LOW (ref >40)
LDL Cholesterol: 117 mg/dL — ABNORMAL HIGH (ref 0–99)
Total CHOL/HDL Ratio: 4.9 RATIO
Triglycerides: 162 mg/dL — ABNORMAL HIGH (ref <150)
VLDL: 32 mg/dL (ref 0–40)

## 2018-08-18 LAB — HEMOGLOBIN A1C
Hgb A1c MFr Bld: 6 % — ABNORMAL HIGH (ref 4.8–5.6)
Mean Plasma Glucose: 125.5 mg/dL

## 2018-08-18 LAB — MAGNESIUM: Magnesium: 1.7 mg/dL (ref 1.7–2.4)

## 2018-08-18 MED ORDER — CLOPIDOGREL BISULFATE 75 MG PO TABS
75.0000 mg | ORAL_TABLET | Freq: Every day | ORAL | Status: DC
  Administered 2018-08-18: 18:00:00 75 mg via ORAL
  Filled 2018-08-18: qty 1

## 2018-08-18 NOTE — Progress Notes (Addendum)
STROKE TEAM PROGRESS NOTE   HISTORY OF PRESENT ILLNESS (per Dr Otelia Limes)  Bradley [name] is an 43 y.o. male with history of stroke, sleep apnea, renal disease, hypertension, headaches, asthma.  Patient states that on 08/15/2018 at approximately 3 AM patient awoke and noted that he had a headache and he did not feel right.  At that time he tried to get up and felt off balance.  He did not notice any other symptoms at that time.  He did notice some neck discomfort later that day. He stated that the imbalance would come and go.  He denied having any appendicular dysmetria or difficulty grabbing his fork, spoon or drinks.  He denied any visual changes such as double vision or blurred vision.  He denied any weakness in any extremity.  He denied any numbness in any extremity.  He denied any aphasia or dysarthria.  SUBJECTIVE (INTERVAL HISTORY) His RN is at the bedside. Stroke wk up underway for better etiology. Neuro exam stable.   OBJECTIVE Vitals:   08/17/18 2329 08/18/18 0259 08/18/18 0800 08/18/18 1135  BP:  (!) 157/62 131/77 (!) 150/62  Pulse: 82  81 74  Resp: Temp:  98.5 F (36.9 C) 97.8 F (36.6 C) 98.2 F (36.8 C)  TempSrc:  Oral Oral Oral  SpO2: 98%  98% 96%  Weight:      Height:        CBC:  Recent Labs  Lab 08/17/18 0750  WBC 6.3  NEUTROABS 4.8  HGB 14.9  HCT 48.7  MCV 88.2  PLT 181    Basic Metabolic Panel:  Recent Labs  Lab 08/17/18 0750 08/18/18 0340  NA 139  --   K 4.1  --   CL 108  --   CO2 24  --   GLUCOSE 131*  --   BUN 16  --   CREATININE 1.48*  --   CALCIUM 10.0  --   MG  --  1.7    Lipid Panel:     Component Value Date/Time   CHOL 187 08/18/2018 0340   TRIG 162 (H) 08/18/2018 0340   HDL 38 (L) 08/18/2018 0340   CHOLHDL 4.9 08/18/2018 0340   VLDL 32 08/18/2018 0340   LDLCALC 117 (H) 08/18/2018 0340   HgbA1c:  Lab Results  Component Value Date   HGBA1C 6.0 (H) 08/18/2018   Urine Drug Screen: No results found for: LABOPIA,  COCAINSCRNUR, LABBENZ, AMPHETMU, THCU, LABBARB  Alcohol Level No results found for: [facility]  IMAGING   Dg Chest 2 View  Result Date: 08/17/2018 CLINICAL DATA:  Headache. EXAM: CHEST - 2 VIEW COMPARISON:  04/16/2017. FINDINGS: Increased body habitus degrades image quality, but overall the study is diagnostic. The heart size and mediastinal contours are within normal limits. Both lungs are clear. The visualized skeletal structures are unremarkable. IMPRESSION: No active cardiopulmonary disease. Stable from priors. Electronically Signed   By: Elsie Stain M.D.   On: 08/17/2018 11:01   Ct Head Wo Contrast  Result Date: 08/18/2018 CLINICAL DATA:  Stroke follow-up EXAM: CT HEAD WITHOUT CONTRAST TECHNIQUE: Contiguous axial images were obtained from the base of the skull through the vertex without intravenous contrast. COMPARISON:  Brain MRI from yesterday FINDINGS: Brain: Acute left PICA territory infarct with lower fourth ventricular effacement. No evidence of infarct progression. No hemorrhagic conversion. Negative for hydrocephalus. Vascular: No acute finding.  MRA was performed yesterday. Skull: Negative Sinuses/Orbits: Negative IMPRESSION: Acute left PICA territory infarct with  lower fourth ventricular narrowing. No hydrocephalus or hemorrhagic conversion. Electronically Signed   By: Marnee Spring M.D.   On: 08/18/2018 04:27   Ct Head Wo Contrast  Result Date: 08/17/2018 CLINICAL DATA:  Headache for 2 days, no nausea or vomiting. History of hypertension. History of end-stage renal disease. EXAM: CT HEAD WITHOUT CONTRAST TECHNIQUE: Contiguous axial images were obtained from the base of the skull through the vertex without intravenous contrast. COMPARISON:  None. FINDINGS: Brain: There is a well-defined area of hypoattenuation in the LEFT inferior cerebellum, corresponding to much of the LEFT posterior inferior cerebellar artery territory, consistent with acute infarction. Slight mass effect on the fourth  ventricle, and slight LEFT-to-RIGHT shift, without visible hemorrhage. Elsewhere, no similar areas of hypoattenuation. No hydrocephalus, extra-axial fluid, or mass lesion. Vascular: Calcification of the cavernous internal carotid arteries consistent with cerebrovascular atherosclerotic disease. No signs of intracranial large vessel occlusion. Skull: Normal. Negative for fracture or focal lesion. Sinuses/Orbits: No acute finding. Other: None. 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 Slight mass effect on the fourth ventricle and slight RIGHT-to-LEFT shift. Close clinical observation is warranted. These results were called by telephone at the time of interpretation on 08/17/2018 at 8:40 am to Dr. Shaune Pollack , who verbally acknowledged these results. Electronically Signed   By: Elsie Stain M.D.   On: 08/17/2018 08:41   Mr Brain Wo Contrast  Result Date: 08/17/2018 CLINICAL DATA:  Headache over the last 2 days. Left cerebellar stroke by CT. EXAM: MRI HEAD WITHOUT CONTRAST MRA HEAD WITHOUT CONTRAST TECHNIQUE: Multiplanar, multiecho pulse sequences of the brain and surrounding structures were obtained without intravenous contrast. Angiographic images of the head were obtained using MRA technique without contrast. COMPARISON:  Head CT same day FINDINGS: MRI HEAD FINDINGS Brain: There is acute infarction affecting the inferior 1/3 to 1/2 of the cerebellum on the left consistent with posterior inferior cerebellar artery territory infarction. No evidence of involvement the medulla. Right cerebellum is normal. Mild swelling but no sign of hemorrhage or ventricular obstruction. Cerebral hemispheres are normal without evidence of old or recent small or large vessel infarction. No mass lesion, hydrocephalus or extra-axial collection. Vascular: Major vessels at the base of the brain show flow. Skull and upper cervical spine: Negative Sinuses/Orbits: Clear/normal  Other: None MRA HEAD FINDINGS Both internal carotid arteries are widely patent into the brain. No siphon stenosis. The anterior and middle cerebral vessels are patent without proximal stenosis, aneurysm or vascular malformation. The right vertebral artery is dominant and widely patent to the basilar, giving the majority of the basilar artery supply. Large right PICA is noted. The left vertebral artery is a small vessel in does appear to show some irregularity. There may be minor amount of flow and left PICA presently. No basilar stenosis. Superior cerebellar and posterior cerebral arteries appear normal. IMPRESSION: Acute left posterior inferior cerebellar artery territory infarction affecting the inferior 1/3 to 1/2 of the left cerebellum. Swelling but no evidence of hemorrhage. No evidence of ventricular obstruction at this moment. Brain otherwise appears normal. MRA shows a normal appearing anterior circulation and a normal appearing right vertebral artery. Left vertebral artery is small and somewhat irregular. There is minimal flow visible within a left PICA based on the source images. Electronically Signed   By: Paulina Fusi M.D.   On: 08/17/2018 13:51   Mr Maxine Glenn Head Wo Contrast  Result Date: 08/17/2018 CLINICAL DATA:  Headache over the last 2 days. Left cerebellar stroke by  CT. EXAM: MRI HEAD WITHOUT CONTRAST MRA HEAD WITHOUT CONTRAST TECHNIQUE: Multiplanar, multiecho pulse sequences of the brain and surrounding structures were obtained without intravenous contrast. Angiographic images of the head were obtained using MRA technique without contrast. COMPARISON:  Head CT same day FINDINGS: MRI HEAD FINDINGS Brain: There is acute infarction affecting the inferior 1/3 to 1/2 of the cerebellum on the left consistent with posterior inferior cerebellar artery territory infarction. No evidence of involvement the medulla. Right cerebellum is normal. Mild swelling but no sign of hemorrhage or ventricular obstruction.  Cerebral hemispheres are normal without evidence of old or recent small or large vessel infarction. No mass lesion, hydrocephalus or extra-axial collection. Vascular: Major vessels at the base of the brain show flow. Skull and upper cervical spine: Negative Sinuses/Orbits: Clear/normal Other: None MRA HEAD FINDINGS Both internal carotid arteries are widely patent into the brain. No siphon stenosis. The anterior and middle cerebral vessels are patent without proximal stenosis, aneurysm or vascular malformation. The right vertebral artery is dominant and widely patent to the basilar, giving the majority of the basilar artery supply. Large right PICA is noted. The left vertebral artery is a small vessel in does appear to show some irregularity. There may be minor amount of flow and left PICA presently. No basilar stenosis. Superior cerebellar and posterior cerebral arteries appear normal. IMPRESSION: Acute left posterior inferior cerebellar artery territory infarction affecting the inferior 1/3 to 1/2 of the left cerebellum. Swelling but no evidence of hemorrhage. No evidence of ventricular obstruction at this moment. Brain otherwise appears normal. MRA shows a normal appearing anterior circulation and a normal appearing right vertebral artery. Left vertebral artery is small and somewhat irregular. There is minimal flow visible within a left PICA based on the source images. Electronically Signed   By: Paulina Fusi M.D.   On: 08/17/2018 13:51   Vas US Carotid (at [facility])  Result Date: 08/18/2018 Carotid Arterial Duplex Study Indications: CVA. Limitations: patient body habitus Performing Technologist: Blanch Media RVS  Examination Guidelines: A complete evaluation includes B-mode imaging, spectral Doppler, color Doppler, and power Doppler as needed of all accessible portions of each vessel. Bilateral testing is considered an integral part of a complete examination. Limited examinations for reoccurring  indications may be performed as noted.  Right Carotid Findings: +----------+--------+--------+--------+------------+--------+           PSV cm/sEDV cm/sStenosisDescribe    Comments +----------+--------+--------+--------+------------+--------+ CCA Prox  75                      heterogenous         +----------+--------+--------+--------+------------+--------+ CCA Distal64      11              heterogenous         +----------+--------+--------+--------+------------+--------+ ICA Prox  40      7       1-39%   heterogenous         +----------+--------+--------+--------+------------+--------+ ICA Distal78      19                                   +----------+--------+--------+--------+------------+--------+ ECA       129     10                                   +----------+--------+--------+--------+------------+--------+ +----------+--------+-------+--------+-------------------+  PSV cm/sEDV cmsDescribeArm Pressure (mmHG) +----------+--------+-------+--------+-------------------+ Subclavian150                                        +----------+--------+-------+--------+-------------------+ +---------+--------+--+--------+--+---------+ VertebralPSV cm/s81EDV cm/s12Antegrade +---------+--------+--+--------+--+---------+  Left Carotid Findings: +----------+--------+--------+--------+------------+--------------+           PSV cm/sEDV cm/sStenosisDescribe    Comments       +----------+--------+--------+--------+------------+--------------+ CCA Prox  111     10              heterogenous               +----------+--------+--------+--------+------------+--------------+ CCA Distal96      13              heterogenous               +----------+--------+--------+--------+------------+--------------+ ICA Prox  54      6       1-39%   heterogenous               +----------+--------+--------+--------+------------+--------------+ ICA  Distal                                    Not visualized +----------+--------+--------+--------+------------+--------------+ ECA       101                                                +----------+--------+--------+--------+------------+--------------+ +----------+--------+--------+--------+-------------------+ SubclavianPSV cm/sEDV cm/sDescribeArm Pressure (mmHG) +----------+--------+--------+--------+-------------------+           111                                         +----------+--------+--------+--------+-------------------+ +---------+--------+--------+--------------+ VertebralPSV cm/sEDV cm/snot visualized +---------+--------+--------+--------------+  Summary: Right Carotid: Velocities in the right ICA are consistent with a 1-39% stenosis. Left Carotid: Velocities in the left ICA are consistent with a 1-39% stenosis. Vertebrals: Right vertebral artery demonstrates antegrade flow. Left vertebral             artery was not visualized. *See table(s) above for measurements and observations.  Electronically signed by Coral Else MD on 08/18/2018 at 1:07:47 PM.    Final    Transthoracic Echocardiogram  00/00/2020 Pending  PHYSICAL EXAM Blood pressure (!) 150/62, pulse 74, temperature 98.2 F (36.8 C), temperature source Oral, resp. rate 16, height  (1.854 m), weight (!) 184.6 kg, SpO2 96 %. Morbidly obese young African-American male Constitutional: Supermorbid obesity.  Psych: Affect appropriate to situation Eyes: No scleral injection HENT: No OP obstrucion Head: Normocephalic.  Cardiovascular: Normal rate and regular rhythm.  Respiratory: Effort normal, non-labored breathing GI: Soft.  No distension. There is no tenderness.  Skin: WDI  Neuro: Mental Status: Patient is awake, alert, oriented to person, place, month, year, and situation. Patient is able to give a clear and coherent history. No signs of aphasia or neglect Cranial Nerves: II: Visual  Fields are full.  III,IV, VI: EOMI without ptosis or diploplia. Pupils equal, round and reactive to light.  Saccadic dysmetria on left lateral gaze. V: Facial sensation is symmetric to temperature VII: Facial movement is symmetric.  VIII: hearing is intact to voice X:  Palat elevates symmetrically XI: Shoulder shrug is symmetric. XII: tongue is midline without atrophy or fasciculations.  Motor: Tone is normal. Bulk is normal. 5/5 strength was present in all four extremities.  Sensory: Sensation is symmetric to light touch and temperature in the arms and legs. Deep Tendon Reflexes: 2+ and symmetric in the biceps and patellae.  Plantars: Toes are downgoing bilaterally.  Cerebellar: Right side is WNL. Left FNF and RAM are ataxic/dysmetric. H to S without significant abnormality bilaterally.   HOME MEDICATIONS:  Medications Prior to Admission  Medication Sig Dispense Refill  . allopurinol (ZYLOPRIM) 300 MG tablet Take 300 mg by mouth daily.    . cinacalcet (SENSIPAR) 90 MG tablet Take 90 mg by mouth See admin instructions. Mon, Wed, Fri    . furosemide (LASIX) 20 MG tablet Take 40 mg by mouth daily.  6  . labetalol (NORMODYNE) 300 MG tablet Take 300 mg by mouth 2 (two) times daily.  3  . loperamide (IMODIUM) 2 MG capsule Take 2 capsules (4 mg total) by mouth 4 (four) times daily as needed for diarrhea or loose stools. 20 capsule 1  . magnesium oxide (MAG-OX) 400 MG tablet Take 800 mg by mouth 2 (two) times daily.   6  . mycophenolate (MYFORTIC) 180 MG EC tablet Take 720 mg by mouth 2 (two) times daily.   6  . Oxycodone HCl 10 MG TABS Take 10 mg by mouth 3 (three) times daily as needed for pain.  0  . predniSONE (DELTASONE) 5 MG tablet Take 5 mg by mouth daily.  6  . sulfamethoxazole-trimethoprim (BACTRIM,SEPTRA) 400-80 MG tablet Take 1 tablet by mouth See admin instructions. Monday, wed, fri  6  . tacrolimus (PROGRAF) 1 MG capsule Take 5 mg by mouth 2 (two) times daily.    . Vitamin D,  Ergocalciferol, (DRISDOL) 1.25 MG (50000 UT) CAPS capsule Take 50,000 Units by mouth once a week. Wednesday    . cyclobenzaprine (FLEXERIL) 10 MG tablet Take 1 tablet (10 mg total) by mouth 2 (two) times daily as needed for muscle spasms. (Patient not taking: Reported on 09/04/2017) 20 tablet 0  . oxyCODONE-acetaminophen (PERCOCET) 5-325 MG tablet Take 1-2 tablets by mouth every 4 (four) hours as needed. (Patient not taking: Reported on 08/17/2018) 15 tablet 0  . traMADol (ULTRAM) 50 MG tablet Take 1 tablet (50 mg total) by mouth every 6 (six) hours as needed. (Patient not taking: Reported on 09/04/2017) 15 tablet 0      HOSPITAL MEDICATIONS:  . aspirin EC  325 mg Oral Daily  . atorvastatin  80 mg Oral q1800  . cinacalcet  90 mg Oral Q M,W,F  . enoxaparin (LOVENOX) injection  40 mg Subcutaneous Q24H  . furosemide  40 mg Oral Daily  . labetalol  300 mg Oral BID  . magnesium oxide  800 mg Oral BID  . mycophenolate  720 mg Oral BID  . predniSONE  5 mg Oral Daily  . sulfamethoxazole-trimethoprim  1 tablet Oral Q M,W,F  . tacrolimus  5 mg Oral BID    ALLERGIES Allergies  Allergen Reactions  . Penicillins Anaphylaxis and Other (See Comments)    Has patient had a PCN reaction causing immediate rash, facial/tongue/throat swelling, SOB or lightheadedness with hypotension: Yes Has patient had a PCN reaction causing severe rash involving mucus membranes or skin necrosis: Yes Has patient had a PCN reaction that required hospitalization Yes Has patient had a PCN reaction occurring within the last 10 years: Unknown If  all of the above answers are "NO", then may proceed with Cephalosporin use.  . Wellbutrin [Bupropion] Rash    ASSESSMENT/PLAN Bradley [name] is a 43 y.o. male with history of 43 y.o. male presenting with a large subacute left cerebellar infarct affecting 1/3-1/2 of the left cerebellar hemisphere.  There is swelling with slight mass-effect on the fourth ventricle. His symptoms  wax/waned for few days before he sought medical attention, therefore no IV tPA offered. He is almost out of the 72-hour time window during which swelling would be most concerning. After 7 days have elapsed, risk of further mass effect with herniation would be low.   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   CT head left cerebellar infarct  MRI head - Left cerebellar infarct  MRA head -Lvert is small and irreg, cut off at L PICA  Carotid Doppler - pending  2D Echo - pending  Sars Corona Virus 2 Neg  LDL - 117  HgbA1c - 6.0  UDS - n/a  VTE prophylaxis -Lovenox  Diet heart healthy  none prior to admission, now on Plavix + ASA for 3 months given degree of intracranial disease, then monotherapy with ASA only.   Patient counseled to be compliant with his antithrombotic medications  Ongoing aggressive stroke risk factor management  Therapy recommendations:  Home; rehab services have s/o as no needs identified  Disposition: d/c home tomorrow  Hypertension  Stable . No further role of permissive hypertension  . Long-term BP goal normotensive  Hyperlipidemia  Lipid lowering medication PTA: none  LDL 117, goal < 70  Current lipid lowering medication:Lipitor  QD  Continue statin at discharge  Diabetes  HgbA1c 6.0, goal < 7.0  Controlled  Other Stroke Risk Factors  Cigarette smoker; advised to stop smoking  Morbid Obesity, Body mass index is 53.7 kg/m., recommend weight loss, diet and exercise as   Obstructive sleep apnea, fails to wear CPAP. Needs out pt f/u with [facility].  Other Active Problems  Back problems/chronic pain  Hospital day # 1  Desiree Metzger-Cihelka, ARNP-C, ANVP-BC  I have personally obtained history,examined this patient, reviewed notes, independently viewed imaging studies, participated in medical decision making and plan of care.ROS  completed by me personally and pertinent positives fully documented  I have made any additions or clarifications directly to the above note.  This is a morbidly obese young male with multiple vascular risk factors presents with ataxia and headache due to left cerebellar infarct from occlusion of hypoplastic left vertebral artery.  He is at risk for cytotoxic edema and development of hydrocephalus and needs close neurological monitoring.  Repeat CT scan of the head from this morning appears to be stable.  Continue aspirin and Plavix and aggressive risk factor modification.  Patient counseled to be compliant with sleep apnea treatment.  He was also counseled to lose weight.  Discussed with Dr. Caleb Popp I have spent a total of   35 minutes with the patient reviewing hospital notes,  test results, labs and examining the patient as well as establishing an assessment and plan that was discussed personally with the patient.  > 50% of time was spent in direct patient care.      Delia Heady, MD Medical Director [facility] Pager: [phone] 08/18/2018 5:34 PM  To contact Stroke Continuity provider, please refer to [email or web address]. After hours, contact General Neurology

## 2018-08-18 NOTE — Progress Notes (Signed)
  Echocardiogram 2D Echocardiogram has been performed.  Celene Skeen 08/18/2018, 10:33 AM

## 2018-08-18 NOTE — TOC Initial Note (Signed)
Transition of Care [facility](TOC) - Initial/Assessment Note    Patient Details  Name: Bradley [name] MRN: [id number] Date of Birth: April 08, 1975  Transition of Care [facility](TOC) CM/SW Contact:    Kermit BaloKelli F Almira Phetteplace, RN Phone Number: 08/18/2018, 11:58 AM  Clinical Narrative:                 No f/u per PT/OT and no DME needs. Pt states he lives between [place] and [place]. He and wife are having issues and he stays here some and with his mother in [place] some. Pt concerned about his job in [place].  Pt denies issues with obtaining or taking home meds.  Pt denies issues with transportation.  Expected Discharge Plan: Home/Self Care Barriers to Discharge: Continued Medical Work up   Patient Goals and CMS Choice Patient states their goals for this hospitalization and ongoing recovery are:: to get back to his job      Expected Discharge Plan and Services Expected Discharge Plan: Home/Self Care       Living arrangements for the past 2 months: Apartment(ground level)                                      Prior Living Arrangements/Services Living arrangements for the past 2 months: Apartment(ground level) Lives with:: Spouse Patient language and need for interpreter reviewed:: Yes(no needs) Do you feel safe going back to the place where you live?: Yes      Need for Family Participation in Patient Care: Yes (Comment)(intermittent supervision) Care giver support system in place?: Yes (comment)(wife able to provide needed supervision.)   Criminal Activity/Legal Involvement Pertinent to Current Situation/Hospitalization: No - Comment as needed  Activities of Daily Living Home Assistive Devices/Equipment: CPAP ADL Screening (condition at time of admission) Patient's cognitive ability adequate to safely complete daily activities?: Yes Is the patient deaf or have difficulty hearing?: No Does the patient have difficulty seeing, even when wearing glasses/contacts?: No Does the patient  have difficulty concentrating, remembering, or making decisions?: No Patient able to express need for assistance with ADLs?: Yes Does the patient have difficulty dressing or bathing?: No Independently performs ADLs?: Yes (appropriate for developmental age) Does the patient have difficulty walking or climbing stairs?: No Weakness of Legs: None Weakness of Arms/Hands: None  Permission Sought/Granted                  Emotional Assessment Appearance:: Appears stated age   Affect (typically observed): Accepting, Appropriate, Pleasant Orientation: : Oriented to Self, Oriented to Place, Oriented to  Time, Oriented to Situation   Psych Involvement: No (comment)  Admission diagnosis:  Bad headache [R51] CVA (cerebral vascular accident) [facility](HCC) [I63.9] Patient Active Problem List   Diagnosis Date Noted  . CVA (cerebral vascular accident) (HCC) 08/17/2018  . Renal transplant, status post 08/17/2018  . CKD (chronic kidney disease), stage III (HCC) 08/17/2018  . Hypertensive urgency 08/17/2018  . Obesity, Class III, BMI 40-49.9 (morbid obesity) (HCC) 08/17/2018  . Tobacco use 08/17/2018  . OSA (obstructive sleep apnea) 08/17/2018  . ESRD (end stage renal disease) (HCC) 12/04/2015  . Dialysis AV fistula malfunction (HCC)   . Aftercare following surgery of the circulatory system, NEC 09/08/2012  . End stage renal disease (HCC) 09/08/2012   PCP:  Karle PlumberArvind, Moogali M, MD Pharmacy:   [facility] [phone] - [place], [place] - [phone] [address] [facility] [address] [phone] [facility]  [place] [address] Phone: [phone] Fax: [phone]     Social Determinants of Health (SDOH) Interventions    Readmission Risk Interventions No flowsheet data found.

## 2018-08-18 NOTE — Evaluation (Signed)
Occupational Therapy Evaluation Patient Details Name: Bradley [name] MRN: [id number] DOB: Dec 24, 1975 Today's Date: 08/18/2018    History of Present Illness 43 y.o. male with a history of ESRD status post renal transplant who presented to the emergency department with 2 days of gradually worsening frontal and occipital headache described as aching pain. 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 Slight mass effect on the fourth ventricle and slight RIGHT-to-LEFT shift.     Clinical Impression   PTA, pt was independent and living between in [place] with wife and kids and [place] stating that he and his wife are in the middle of a separation. Pt currently presenting near baseline function performing grooming, toileting, tub transfers, and functional mobility in hallway at Supervision level. Pt performing simple money management task and path finding without VCs. Pt denies any vision deficits. Educated pt on BEFAST for stroke signs and symptoms. Recommend dc home once medically stable per phsyician. All acute OT needs met and please re-consult if status changes. Thank you.     Follow Up Recommendations  No OT follow up;Supervision - Intermittent    Equipment Recommendations  None recommended by OT    Recommendations for Other Services PT consult     Precautions / Restrictions        Mobility Bed Mobility Overal bed mobility: Independent                Transfers Overall transfer level: Independent                    Balance Overall balance assessment: No apparent balance deficits (not formally assessed)                                         ADL either performed or assessed with clinical judgement   ADL Overall ADL's : Needs assistance/impaired                                       General ADL Comments: Pt performing ADLs and fucntional mobility at  Supervision level. Presenting near baseline function.     Vision Baseline Vision/History: Wears glasses Wears Glasses: At all times Patient Visual Report: No change from baseline       Perception     Praxis      Pertinent Vitals/Pain Pain Assessment: No/denies pain     Hand Dominance Right   Extremity/Trunk Assessment Upper Extremity Assessment Upper Extremity Assessment: Overall WFL for tasks assessed   Lower Extremity Assessment Lower Extremity Assessment: Defer to PT evaluation   Cervical / Trunk Assessment Cervical / Trunk Assessment: Other exceptions Cervical / Trunk Exceptions: Increased body habitus   Communication Communication Communication: No difficulties   Cognition Arousal/Alertness: Awake/alert Behavior During Therapy: WFL for tasks assessed/performed Overall Cognitive Status: Within Functional Limits for tasks assessed                                 General Comments: Pt presenting near baseline for cognition. Pt able to alternate attention between talking with OT and completing a doctor check in via his phone. Pt also completing simple money management question and performing simple path finding task to locate his room.  General Comments  96% on RA. HR 86-102. BP 131/77. Educated pt on stroke signs and symptoms.     Exercises     Shoulder Instructions      Home Living Family/patient expects to be discharged to:: Private residence Living Arrangements: Spouse/significant other Available Help at Discharge: Family;Available PRN/intermittently Type of Home: Apartment(First floor) Home Access: Level entry     Home Layout: One level     Bathroom Shower/Tub: Tub/shower unit;Walk-in shower   Bathroom Toilet: Standard     Home Equipment: None   Additional Comments: Pt alteranting between living in [place] with his wife and [place]      Prior Functioning/Environment Level of Independence: Independent         Comments: 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        OT Problem List: Decreased strength;Impaired balance (sitting and/or standing);Decreased knowledge of precautions      OT Treatment/Interventions:      OT Goals(Current goals can be found in the care plan section) Acute Rehab OT Goals Patient Stated Goal: "Go home" OT Goal Formulation: All assessment and education complete, DC therapy  OT Frequency:     Barriers to D/C:            Co-evaluation              AM-PAC OT "6 Clicks" Daily Activity     Outcome Measure Help from another person eating meals?: None Help from another person taking care of personal grooming?: None Help from another person toileting, which includes using toliet, bedpan, or urinal?: None Help from another person bathing (including washing, rinsing, drying)?: None Help from another person to put on and taking off regular upper body clothing?: None Help from another person to put on and taking off regular lower body clothing?: None 6 Click Score: 24   End of Session Nurse Communication: Mobility status  Activity Tolerance: Patient tolerated treatment well Patient left: in chair;with call bell/phone within reach;with chair alarm set  OT Visit Diagnosis: Unsteadiness on feet (R26.81);Other abnormalities of gait and mobility (R26.89);Muscle weakness (generalized) (M62.81)                Time: 5913-6859 OT Time Calculation (min): 29 min Charges:  OT General Charges $OT Visit: 1 Visit OT Evaluation $OT Eval Moderate Complexity: 1 Mod OT Treatments $Self Care/Home Management : 8-22 mins  Latricia Cerrito MSOT, OTR/L Acute Rehab Pager: [phone] Office: [place] [place] 08/18/2018, 8:44 AM

## 2018-08-18 NOTE — Discharge Instructions (Signed)
You may use over-the-counter Motrin (Ibuprofen), Acetaminophen (Tylenol), topical muscle creams such as SalonPas, Federal-Mogulcy Hot, Bengay, etc. Please stretch, apply heat, and have massage therapy for additional assistance.  Heart Healthy Nutrition Therapy A heart-healthy diet is recommended to reduce your unhealthy blood cholesterol levels, manage high blood pressure, and lower your risk for heart disease. Tips To follow a heart-healthy diet,  Eat a balanced diet with whole grains, fruits and vegetables, and lean protein sources.  Achieve and maintain a healthy weight.  Choose heart-healthy unsaturated fats. Limit saturated fats, trans fats, and cholesterol intake. Eat more plant-based or vegetarian meals using beans and soy foods for protein.  Eat whole, unprocessed foods to limit the amount of sodium (salt) you eat.  Limit refined carbohydrates especially sugar, sweets and sugar-sweetened beverages.  If you drink alcohol, do so in moderation: one serving per day (women) and two servings per day (men).  o One serving is equivalent to 12 ounces beer, 5 ounces wine, or 1.5 ounces distilled spirits Tips for Choosing Heart-Healthy Fats Choose lean protein and low-fat dairy foods to reduce saturated fat intake.  Saturated fat is usually found in animal-based protein and is associated with certain health risks. Saturated fat is the biggest contributor to raised low-density lipoprotein (LDL) cholesterol levels in the diet. Research shows that limiting saturated fat lowers unhealthy cholesterol levels. Eat no more than 7% of your total calories each day from saturated fat. Ask your RDN to help you determine how much saturated fat is right for you.  There are many foods that do not contain large amounts of saturated fats. Swapping these foods to replace foods high in saturated fats will help you limit the saturated fat you eat and improve your cholesterol levels. You can also try eating more plant-based or  vegetarian meals. Instead of Try:  Whole milk, cheese, yogurt, and ice cream 1%, %, or skim milk, low-fat cheese, non-fat yogurt, and low-fat ice cream  Fatty, marbled beef and pork Lean beef, pork, or venison  Poultry with skin Poultry without skin  Butter, stick margarine Reduced-fat, whipped, or liquid spreads  Coconut oil, palm oil Liquid vegetable oils: corn, canola, olive, soybean and safflower oils   Avoid trans fats.  Trans fats increase levels of LDL-cholesterol. Hydrogenated fat in processed foods is the main source of trans fats in foods.   Trans fats can be found in stick margarine, shortening, processed sweets, baked goods, some fried foods, and packaged foods made with hydrogenated oils. Avoid foods with partially hydrogenated oil on the ingredient list such as: cookies, pastries, baked goods, biscuits, crackers, microwave popcorn, and frozen dinners.   Choose foods with heart healthy fats.  Polyunsaturated and monounsaturated fat are unsaturated fats that may help lower your blood cholesterol level when used in place of saturated fat in your diet.  Ask your RDN about taking a dietary supplement with plant sterols and stanols to help lower your cholesterol level.  Research shows that substituting saturated fats with unsaturated fats is beneficial to cholesterol levels. Try these easy swaps:   Instead of Try:  Butter, stick margarine, or solid shortening Reduced-fat, whipped, or liquid spreads  Beef, pork, or poultry with skin   Fish and seafood  Chips, crackers, snack foods Raw or unsalted nuts and seeds or nut butters Hummus with vegetables Avocado on toast  Coconut oil, palm oil Liquid vegetable oils: corn, canola, olive, soybean and safflower oils    Limit the amount of cholesterol you eat to less than  200 milligrams per day.  Cholesterol is a substance carried through the bloodstream via lipoproteins, which are known as transporters of fat. Some body  functions need cholesterol to work properly, but too much cholesterol in the bloodstream can damage arteries and build up blood vessel linings (which can lead to heart attack and stroke). You should eat less than 200 milligrams cholesterol per day.  People respond differently to eating cholesterol. There is no test available right now that can figure out which people will respond more to dietary cholesterol and which will respond less. For individuals with high intake of dietary cholesterol, different types of increase (none, small, moderate, large) in LDL-cholesterol levels are all possible.    Food sources of cholesterol include egg yolks and organ meats such as liver, gizzards.  Limit egg yolks to two to four per week and avoid organ meats like liver and gizzards to control cholesterol intake.   Tips for Choosing Heart-Healthy Carbohydrates Consume foods rich in viscous (soluble) fiber  Viscous, or soluble, fiber is found in the walls of plant cells. Viscous fiber is found only in plant-based foods--animal-based foods like meat or dairy products do not contain fiber. In the stomach, viscous fibers absorb water and swell to form a thick, jelly-like mass. This helps to lower your unhealthy cholesterol   o Rich sources of viscous fiber include asparagus, Brussels sprouts, sweet potatoes, turnips, apricots, mangoes, oranges, legumes, barley, oats, and oat bran.   Eat at least 5 to 10 grams of viscous fiber each day. As you increase your fiber intake gradually, also increase the amount of water you drink. This will help prevent constipation.  If you have difficulty achieving this goal, ask your RDN about fiber laxatives. Choose fiber supplements made with viscous fibers such as psyllium seed husks or methylcellulose to help lower unhealthy cholesterol.    Limit refined carbohydrates   There are three types of carbohydrates: starches, sugar, and fiber. Some carbohydrates occur naturally in food,  like the starches in rice or corn or the sugars in fruits and milk. Refined carbohydrates--foods with high amounts of simple sugars--can raise triglyceride levels. High triglyceride levels are associated with coronary heart disease.  Some examples of refined carbohydrate foods are table sugar, sweets, and beverages sweetened with added sugar.  Additional Lifestyle Tips Achieve and maintain a healthy weight.  Talk with your RDN or your doctor about what is a healthy weight for you.  Set goals to reach and maintain that weight.   To lose weight, reduce your calorie intake along with increasing your physical activity. A weight loss of 10 to 15 pounds could reduce LDL-cholesterol by 5 milligrams per deciliter. Participate in physical activity.  Talk with your health care team to find out what types of physical activity are best for you. Set a plan to get about 30 minutes of exercise on most days.   Foods Recommended Food Group Foods Recommended  Grains Whole grain breads and cereals, including whole wheat, barley, rye, buckwheat, corn, teff, quinoa, millet, amaranth, brown or wild rice, sorghum, and oats Pasta, especially whole wheat or other whole grain types  [employer] [place] [employer], quinoa or wild rice Whole grain crackers, bread, rolls, pitas Home-made bread with reduced-sodium baking soda  Protein Foods Lean cuts of beef and pork (loin, leg, round, extra lean hamburger)  Skinless Press photographer and other wild game Dried beans and peas Nuts and nut butters (unsalted) Meat alternatives made with soy or textured vegetable protein  Egg whites or  egg substitute Cold cuts made with lean meat or soy protein  Dairy Nonfat (skim), low-fat, or 1%-fat milk  Nonfat or low-fat yogurt or cottage cheese Fat-free and low-fat cheese  Vegetables Fresh, frozen, or canned vegetables without added fat or salt   Fruits Fresh, frozen, canned, or dried fruit   Oils Unsaturated oils (corn, olive, peanut, soy,  sunflower, canola)  Soft or liquid margarines and vegetable oil spreads  Salad dressings made from unsaturated fats Seeds and nuts Avocado   Foods Not Recommended Food Group Foods Not Recommended  Grains Breads or crackers topped with salt Cereals (hot or cold) with more than 300 mg sodium per serving Biscuits, cornbread, and other quick breads prepared with baking soda Bread crumbs or stuffing mix from a store High-fat bakery products, such as doughnuts, biscuits, croissants, danish pastries, pies, cookies Instant cooking foods to which you add hot water and stir--potatoes, noodles, rice, etc. Packaged starchy foods--seasoned noodle or rice dishes, stuffing mix, macaroni and cheese dinner Snacks made with partially hydrogenated oils, including chips, cheese puffs, snack mixes, regular crackers, butter-flavored popcorn  Protein Foods Higher-fat cuts of meats (ribs, t-bone steak, regular hamburger) Bacon, sausage, or hot dogs Cold cuts, such as salami or bologna, deli meats, cured meats, corned beef Organ meats (liver, brains, gizzards, sweetbreads) Poultry with skin Fried or smoked meat, poultry, and fish Whole eggs and egg yolks (more than 2-4 per week) Salted legumes, nuts, seeds, or nut/seed butters Meat alternatives with high levels of sodium (>300 mg per serving) or saturated fat (>5 g per serving)  Dairy Whole milk,?2% fat milk, buttermilk Whole milk yogurt or ice cream Cream Half-&-half Cream cheese Sour cream Cheese  Vegetables Canned or frozen vegetables with salt, fresh vegetables prepared with salt, butter, cheese, or cream sauce Fried vegetables Pickled vegetables such as olives, pickles, or sauerkraut  Fruits Fried fruits Fruits served with butter or cream  Oils Butter, stick margarine, shortening Partially hydrogenated oils or trans fats Tropical oils (coconut, palm, palm kernel oils)  Other Candy, sugar sweetened soft drinks and desserts Salt, sea salt,  garlic salt, and seasoning mixes containing salt Bouillon cubes Ketchup, barbecue sauce, Worcestershire sauce, soy sauce, teriyaki sauce Miso Salsa Pickles, olives, relish  Heart-Healthy Eating Sample 1-Day Menu  Breakfast 1 cup oatmeal 1 cup fat-free milk 1 cup blueberries 1 cup brewed coffee 1 ounce almonds  Lunch  2 slices whole-wheat bread 2 oz lean deli [place] breast 1 oz low-fat Swiss cheese 2 slices tomato 2 lettuce leaves 1 pear 1 cup skim milk  Afternoon Snack  1 oz trail mix (with nuts, seeds, raisins)  Evening Meal  3 oz broiled salmon 2/3 cup brown rice 1 tsp margarine 1/2 cup cooked broccoli 1/2 cup cooked carrots 1 cup tossed salad 1 teaspoon olive oil and vinegar dressing 1 small whole-wheat roll 1 tsp margarine 1 cup tea  Evening Snack  1 banana   Roslyn Smiling, MS, RD, LDN Clinical Dietitian Office phone # [phone]

## 2018-08-18 NOTE — Progress Notes (Signed)
Carotid duplex has been completed.   Preliminary results in CV Proc.   Blanch Media 08/18/2018 10:26 AM

## 2018-08-18 NOTE — Evaluation (Signed)
Physical Therapy Evaluation Patient Details Name: Bradley [name] MRN: [id number] DOB: February 11, 1976 Today's Date: 08/18/2018   History of Present Illness  43 y.o. male with a history of ESRD status post renal transplant who presented to the emergency department with 2 days of gradually worsening frontal and occipital headache described as aching pain. 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 Slight mass effect on the fourth ventricle and slight RIGHT-to-LEFT shift.     Clinical Impression  Patient admitted with the above listed diagnosis. Patient reports IND with all mobility and ADLs prior to admission. Patient today performing all transfers and mobility with supervision to Mod I for safety - no LOB or overt instability. Able to navigate obstacles and stairs without issue. Patient stating he feels at baseline level of functioning. No further acute PT needs identified. PT to sign off. Please re-consult if needed.     Follow Up Recommendations No PT follow up    Equipment Recommendations  None recommended by PT    Recommendations for Other Services       Precautions / Restrictions Precautions Precautions: Fall Restrictions Weight Bearing Restrictions: No      Mobility  Bed Mobility Overal bed mobility: Independent                Transfers Overall transfer level: Independent                  Ambulation/Gait Ambulation/Gait assistance: Supervision;Modified independent (Device/Increase time) Gait Distance (Feet): 300 Feet Assistive device: None Gait Pattern/deviations: Step-through pattern;Decreased stride length Gait velocity: WNL   General Gait Details: steady pace of gait; no LOB; no subjective complaints  Stairs Stairs: Yes Stairs assistance: Supervision;Modified independent (Device/Increase time) Stair Management: One rail Right;Alternating pattern;Forwards Number of Stairs:  2(x2)    Wheelchair Mobility    Modified Rankin (Stroke Patients Only) Modified Rankin (Stroke Patients Only) Pre-Morbid Rankin Score: No symptoms Modified Rankin: Moderate disability     Balance Overall balance assessment: No apparent balance deficits (not formally assessed)                                           Pertinent Vitals/Pain Pain Assessment: No/denies pain    Home Living Family/patient expects to be discharged to:: Private residence Living Arrangements: Spouse/significant other Available Help at Discharge: Family;Available PRN/intermittently Type of Home: Apartment(First floor) Home Access: Level entry     Home Layout: One level Home Equipment: None Additional Comments: Pt alteranting between living in [place] with his [place] and [place]    Prior Function Level of Independence: Independent         Comments: 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     Hand Dominance   Dominant Hand: Right    Extremity/Trunk Assessment   Upper Extremity Assessment Upper Extremity Assessment: Defer to OT evaluation    Lower Extremity Assessment Lower Extremity Assessment: Overall WFL for tasks assessed    Cervical / Trunk Assessment Cervical / Trunk Assessment: Other exceptions Cervical / Trunk Exceptions: Increased body habitus  Communication   Communication: No difficulties  Cognition Arousal/Alertness: Awake/alert Behavior During Therapy: WFL for tasks assessed/performed Overall Cognitive Status: Within Functional Limits for tasks assessed  General Comments: Pt presenting near baseline for cognition. Pt able to alternate attention between talking with OT and completing a doctor check in via his phone. Pt also completing simple money management question and performing simple path finding task to locate his room.       General Comments General comments (skin integrity,  edema, etc.): VSS throughout session; no complaints    Exercises     Assessment/Plan    PT Assessment Patent does not need any further PT services  PT Problem List         PT Treatment Interventions      PT Goals (Current goals can be found in the Care Plan section)  Acute Rehab PT Goals Patient Stated Goal: "Go home" PT Goal Formulation: All assessment and education complete, DC therapy    Frequency     Barriers to discharge        Co-evaluation               AM-PAC PT "6 Clicks" Mobility  Outcome Measure Help needed turning from your back to your side while in a flat bed without using bedrails?: None Help needed moving from lying on your back to sitting on the side of a flat bed without using bedrails?: None Help needed moving to and from a bed to a chair (including a wheelchair)?: None Help needed standing up from a chair using your arms (e.g., wheelchair or bedside chair)?: None Help needed to walk in hospital room?: A Little Help needed climbing 3-5 steps with a railing? : A Little 6 Click Score: 22    End of Session   Activity Tolerance: Patient tolerated treatment well Patient left: in bed(sitting EOB to eat breakfast) Nurse Communication: Mobility status PT Visit Diagnosis: Unsteadiness on feet (R26.81)    Time: 1610-9604 PT Time Calculation (min) (ACUTE ONLY): 16 min   Charges:   PT Evaluation $PT Eval Moderate Complexity: 1 Mod           Kipp Laurence, PT, DPT Supplemental Physical Therapist 08/18/18 10:08 AM Pager: [phone] Office: [phone]

## 2018-08-18 NOTE — Plan of Care (Signed)
Nutrition Education Note  RD consulted for nutrition diet education.  Lipid Panel     Component Value Date/Time   CHOL 187 08/18/2018 0340   TRIG 162 (H) 08/18/2018 0340   HDL 38 (L) 08/18/2018 0340   CHOLHDL 4.9 08/18/2018 0340   VLDL 32 08/18/2018 0340   LDLCALC 117 (H) 08/18/2018 0340    RD working remotely. RD contacted pt via inpatient room phone. RD attached "Heart Healthy Nutrition Therapy" handout from the [employer] to pt discharge instructions. Reviewed patient's dietary recall. Provided examples on ways to decrease sodium and fat intake in diet. Discouraged intake of processed foods and use of salt shaker. Encouraged fresh fruits and vegetables as well as whole grain sources of carbohydrates to maximize fiber intake. Teach back method used.  Expect good compliance.  Body mass index is 53.7 kg/m. Pt meets criteria for morbid obesity based on current BMI.  Current diet order is heart healthy/Carbohydrate modified, patient is consuming approximately 100% of meals at this time. Labs and medications reviewed. No further nutrition interventions warranted at this time. RD contact information provided. If additional nutrition issues arise, please re-consult RD.  Roslyn Smiling, MS, RD, LDN Pager # [phone] After hours/ weekend pager # [phone]

## 2018-08-18 NOTE — Progress Notes (Signed)
Pt was transported off unit for CT scan.

## 2018-08-18 NOTE — Progress Notes (Signed)
[facility] PROGRESS NOTE  Bradley [name] BSJ:[id number] DOB: [birth year] DOA: 08/17/2018 PCP: Bradley Plumber, MD  Assessment/Plan: 1. Left posterior inferior cerebellar infarct.  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  LDL 117, A1c 6, TTE shows normal EF.  No significant stenosis of bilateral carotid arteries.  Neurology recommends Plavix and aspirin for 3 months given degree of intracranial disease, on therapy with aspirin alone.  Continue to monitor patient neurologically for additional 24 hours given location of infarct and high risk for edema and hydrocephalus, repeat CT head this am shows no progression.  Aggressive risk factor management (advised smoking cessation, recommended weight loss, encourage outpatient follow-up with sleep center for OSA. 2. Hyperlipidemia, LDL 117, initiate Lipitor 80 mg will continue on discharge. 3. Hypertension no need for permissive, continue home labetalol monitor to maintain normotensive pressures 4. Abuse A1c 6, well-controlled, close monitor CBG. BPs, 5. Renal transplant, stable.  Continue home Myfortic, prednisone, Bactrim, Prograf.  Code Status: FULL CODE Family Communication: none at bedside (indicate person spoken with, relationship, and if by phone, the number) Disposition Plan: dc in 24 hours if neuro exam remains unchanged   Consultants:  neurology  Procedures:  TTE  Antibiotics:  none (indicate start date, and stop date if known)  HPI/Subjective:  Bradley [name] is a 43 y.o. year old male with medical history significant for history of stroke, sleep apnea, renal disease, hypertension, headaches, asthma who presented on 08/17/2018 with reports of headache that started on 5/2 4and was found to have LUE ataxia related to  acute left posterior inferior cerebellar infarct related to left vertebral artery occlusion confirmed on MRI head.   Headache much better No new weakness No cP  Objective: Vitals:   08/18/18 1621  08/18/18 1939  BP: (!) 164/80 (!) 157/84  Pulse: 79 70  Resp: (!) 22 18  Temp: 98.5 F (36.9 C) 98.8 F (37.1 C)  SpO2: 96% 95%    Intake/Output Summary (Last 24 hours) at 08/18/2018 2219 Last data filed at 08/18/2018 2100 Gross per 24 hour  Intake 1020 ml  Output 3350 ml  Net -2330 ml   Filed Weights   08/17/18 0748  Weight: (!) 184.6 kg    Exam:   General:  Morbidly obese, no istress  Cardiovascular: RRR, no edema  Respiratory: normal effort on room air,  Abdomen: soft obese abdomen, nontender  Musculoskeletal: normal ROM   Skin no rashes or lesions  Neurologic alert and oriented x 4, no appreciable focal deficits  Data Reviewed: Basic Metabolic Panel: Recent Labs  Lab 08/17/18 0750 08/18/18 0340  NA 139  --   K 4.1  --   CL 108  --   CO2 24  --   GLUCOSE 131*  --   BUN 16  --   CREATININE 1.48*  --   CALCIUM 10.0  --   MG  --  1.7   Liver Function Tests: No results for input(s): AST, ALT, ALKPHOS, BILITOT, PROT, ALBUMIN in the last 168 hours. No results for input(s): LIPASE, AMYLASE in the last 168 hours. No results for input(s): AMMONIA in the last 168 hours. CBC: Recent Labs  Lab 08/17/18 0750  WBC 6.3  NEUTROABS 4.8  HGB 14.9  HCT 48.7  MCV 88.2  PLT 181   Cardiac Enzymes: Recent Labs  Lab 08/17/18 0901  TROPONINI <0.03   BNP (last 3 results) No results for input(s): BNP in the last 8760 hours.  ProBNP (last 3 results) No  results for input(s): PROBNP in the last 8760 hours.  CBG: No results for input(s): GLUCAP in the last 168 hours.  Recent Results (from the past 240 hour(s))  SARS Coronavirus 2 (CEPHEID - Performed in [facility] hospital lab), Hosp Order     Status: None   Collection Time: 08/17/18 10:44 AM  Result Value Ref Range Status   SARS Coronavirus 2 NEGATIVE NEGATIVE Final    Comment: (NOTE) If result is NEGATIVE SARS-CoV-2 target nucleic acids are NOT DETECTED. The SARS-CoV-2 RNA is generally detectable in  upper and lower  respiratory specimens during the acute phase of infection. The lowest  concentration of SARS-CoV-2 viral copies this assay can detect is 250  copies / mL. A negative result does not preclude SARS-CoV-2 infection  and should not be used as the sole basis for treatment or other  patient management decisions.  A negative result may occur with  improper specimen collection / handling, submission of specimen other  than nasopharyngeal swab, presence of viral mutation(s) within the  areas targeted by this assay, and inadequate number of viral copies  (<250 copies / mL). A negative result must be combined with clinical  observations, patient history, and epidemiological information. If result is POSITIVE SARS-CoV-2 target nucleic acids are DETECTED. The SARS-CoV-2 RNA is generally detectable in upper and lower  respiratory specimens dur ing the acute phase of infection.  Positive  results are indicative of active infection with SARS-CoV-2.  Clinical  correlation with patient history and other diagnostic information is  necessary to determine patient infection status.  Positive results do  not rule out bacterial infection or co-infection with other viruses. If result is PRESUMPTIVE POSTIVE SARS-CoV-2 nucleic acids MAY BE PRESENT.   A presumptive positive result was obtained on the submitted specimen  and confirmed on repeat testing.  While 2019 novel coronavirus  (SARS-CoV-2) nucleic acids may be present in the submitted sample  additional confirmatory testing may be necessary for epidemiological  and / or clinical management purposes  to differentiate between  SARS-CoV-2 and other Sarbecovirus currently known to infect humans.  If clinically indicated additional testing with an alternate test  methodology [id number]) is advised. The SARS-CoV-2 RNA is generally  detectable in upper and lower respiratory sp ecimens during the acute  phase of infection. The expected result is  Negative. Fact Sheet for Patients:  BoilerBrush.com.cy Fact Sheet for Healthcare Providers: https://pope.com/ This test is not yet approved or cleared by the [place] FDA and has been authorized for detection and/or diagnosis of SARS-CoV-2 by FDA under an Emergency Use Authorization (EUA).  This EUA will remain in effect (meaning this test can be used) for the duration of the COVID-19 declaration under Section 564(b)(1) of the Act, 21 U.S.C. section 360bbb-3(b)(1), unless the authorization is terminated or revoked sooner. Performed at [facility], [address] [place], [place] [address]      Studies: Dg Chest 2 View  Result Date: 08/17/2018 CLINICAL DATA:  Headache. EXAM: CHEST - 2 VIEW COMPARISON:  04/16/2017. FINDINGS: Increased body habitus degrades image quality, but overall the study is diagnostic. The heart size and mediastinal contours are within normal limits. Both lungs are clear. The visualized skeletal structures are unremarkable. IMPRESSION: No active cardiopulmonary disease. Stable from priors. Electronically Signed   By: Elsie Stain M.D.   On: 08/17/2018 11:01   Ct Head Wo Contrast  Result Date: 08/18/2018 CLINICAL DATA:  Stroke follow-up EXAM: CT HEAD WITHOUT CONTRAST TECHNIQUE: Contiguous axial images  were obtained from the base of the skull through the vertex without intravenous contrast. COMPARISON:  Brain MRI from yesterday FINDINGS: Brain: Acute left PICA territory infarct with lower fourth ventricular effacement. No evidence of infarct progression. No hemorrhagic conversion. Negative for hydrocephalus. Vascular: No acute finding.  MRA was performed yesterday. Skull: Negative Sinuses/Orbits: Negative IMPRESSION: Acute left PICA territory infarct with lower fourth ventricular narrowing. No hydrocephalus or hemorrhagic conversion. Electronically Signed   By: Marnee SpringJonathon  Watts M.D.   On: 08/18/2018 04:27   Ct Head  Wo Contrast  Result Date: 08/17/2018 CLINICAL DATA:  Headache for 2 days, no nausea or vomiting. History of hypertension. History of end-stage renal disease. EXAM: CT HEAD WITHOUT CONTRAST TECHNIQUE: Contiguous axial images were obtained from the base of the skull through the vertex without intravenous contrast. COMPARISON:  None. FINDINGS: Brain: There is a well-defined area of hypoattenuation in the LEFT inferior cerebellum, corresponding to much of the LEFT posterior inferior cerebellar artery territory, consistent with acute infarction. Slight mass effect on the fourth ventricle, and slight LEFT-to-RIGHT shift, without visible hemorrhage. Elsewhere, no similar areas of hypoattenuation. No hydrocephalus, extra-axial fluid, or mass lesion. Vascular: Calcification of the cavernous internal carotid arteries consistent with cerebrovascular atherosclerotic disease. No signs of intracranial large vessel occlusion. Skull: Normal. Negative for fracture or focal lesion. Sinuses/Orbits: No acute finding. Other: None. 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 Slight mass effect on the fourth ventricle and slight RIGHT-to-LEFT shift. Close clinical observation is warranted. These results were called by telephone at the time of interpretation on 08/17/2018 at 8:40 am to Dr. Shaune PollackAMERON ISAACS , who verbally acknowledged these results. Electronically Signed   By: Elsie StainJohn T Curnes M.D.   On: 08/17/2018 08:41   Mr Brain Wo Contrast  Result Date: 08/17/2018 CLINICAL DATA:  Headache over the last 2 days. Left cerebellar stroke by CT. EXAM: MRI HEAD WITHOUT CONTRAST MRA HEAD WITHOUT CONTRAST TECHNIQUE: Multiplanar, multiecho pulse sequences of the brain and surrounding structures were obtained without intravenous contrast. Angiographic images of the head were obtained using MRA technique without contrast. COMPARISON:  Head CT same day FINDINGS: MRI HEAD FINDINGS Brain: There is  acute infarction affecting the inferior 1/3 to 1/2 of the cerebellum on the left consistent with posterior inferior cerebellar artery territory infarction. No evidence of involvement the medulla. Right cerebellum is normal. Mild swelling but no sign of hemorrhage or ventricular obstruction. Cerebral hemispheres are normal without evidence of old or recent small or large vessel infarction. No mass lesion, hydrocephalus or extra-axial collection. Vascular: Major vessels at the base of the brain show flow. Skull and upper cervical spine: Negative Sinuses/Orbits: Clear/normal Other: None MRA HEAD FINDINGS Both internal carotid arteries are widely patent into the brain. No siphon stenosis. The anterior and middle cerebral vessels are patent without proximal stenosis, aneurysm or vascular malformation. The right vertebral artery is dominant and widely patent to the basilar, giving the majority of the basilar artery supply. Large right PICA is noted. The left vertebral artery is a small vessel in does appear to show some irregularity. There may be minor amount of flow and left PICA presently. No basilar stenosis. Superior cerebellar and posterior cerebral arteries appear normal. IMPRESSION: Acute left posterior inferior cerebellar artery territory infarction affecting the inferior 1/3 to 1/2 of the left cerebellum. Swelling but no evidence of hemorrhage. No evidence of ventricular obstruction at this moment. Brain otherwise appears normal. MRA shows a normal appearing anterior circulation and a normal appearing  right vertebral artery. Left vertebral artery is small and somewhat irregular. There is minimal flow visible within a left PICA based on the source images. Electronically Signed   By: Paulina Fusi M.D.   On: 08/17/2018 13:51   Mr Maxine Glenn Head Wo Contrast  Result Date: 08/17/2018 CLINICAL DATA:  Headache over the last 2 days. Left cerebellar stroke by CT. EXAM: MRI HEAD WITHOUT CONTRAST MRA HEAD WITHOUT CONTRAST  TECHNIQUE: Multiplanar, multiecho pulse sequences of the brain and surrounding structures were obtained without intravenous contrast. Angiographic images of the head were obtained using MRA technique without contrast. COMPARISON:  Head CT same day FINDINGS: MRI HEAD FINDINGS Brain: There is acute infarction affecting the inferior 1/3 to 1/2 of the cerebellum on the left consistent with posterior inferior cerebellar artery territory infarction. No evidence of involvement the medulla. Right cerebellum is normal. Mild swelling but no sign of hemorrhage or ventricular obstruction. Cerebral hemispheres are normal without evidence of old or recent small or large vessel infarction. No mass lesion, hydrocephalus or extra-axial collection. Vascular: Major vessels at the base of the brain show flow. Skull and upper cervical spine: Negative Sinuses/Orbits: Clear/normal Other: None MRA HEAD FINDINGS Both internal carotid arteries are widely patent into the brain. No siphon stenosis. The anterior and middle cerebral vessels are patent without proximal stenosis, aneurysm or vascular malformation. The right vertebral artery is dominant and widely patent to the basilar, giving the majority of the basilar artery supply. Large right PICA is noted. The left vertebral artery is a small vessel in does appear to show some irregularity. There may be minor amount of flow and left PICA presently. No basilar stenosis. Superior cerebellar and posterior cerebral arteries appear normal. IMPRESSION: Acute left posterior inferior cerebellar artery territory infarction affecting the inferior 1/3 to 1/2 of the left cerebellum. Swelling but no evidence of hemorrhage. No evidence of ventricular obstruction at this moment. Brain otherwise appears normal. MRA shows a normal appearing anterior circulation and a normal appearing right vertebral artery. Left vertebral artery is small and somewhat irregular. There is minimal flow visible within a left PICA  based on the source images. Electronically Signed   By: Paulina Fusi M.D.   On: 08/17/2018 13:51   Vas US Carotid (at [facility])  Result Date: 08/18/2018 Carotid Arterial Duplex Study Indications: CVA. Limitations: patient body habitus Performing Technologist: Blanch Media RVS  Examination Guidelines: A complete evaluation includes B-mode imaging, spectral Doppler, color Doppler, and power Doppler as needed of all accessible portions of each vessel. Bilateral testing is considered an integral part of a complete examination. Limited examinations for reoccurring indications may be performed as noted.  Right Carotid Findings: +----------+--------+--------+--------+------------+--------+           PSV cm/sEDV cm/sStenosisDescribe    Comments +----------+--------+--------+--------+------------+--------+ CCA Prox  75                      heterogenous         +----------+--------+--------+--------+------------+--------+ CCA Distal64      11              heterogenous         +----------+--------+--------+--------+------------+--------+ ICA Prox  40      7       1-39%   heterogenous         +----------+--------+--------+--------+------------+--------+ ICA Distal78      19                                   +----------+--------+--------+--------+------------+--------+  ECA       129     10                                   +----------+--------+--------+--------+------------+--------+ +----------+--------+-------+--------+-------------------+           PSV cm/sEDV cmsDescribeArm Pressure (mmHG) +----------+--------+-------+--------+-------------------+ Subclavian150                                        +----------+--------+-------+--------+-------------------+ +---------+--------+--+--------+--+---------+ VertebralPSV cm/s81EDV cm/s12Antegrade +---------+--------+--+--------+--+---------+  Left Carotid Findings:  +----------+--------+--------+--------+------------+--------------+           PSV cm/sEDV cm/sStenosisDescribe    Comments       +----------+--------+--------+--------+------------+--------------+ CCA Prox  111     10              heterogenous               +----------+--------+--------+--------+------------+--------------+ CCA Distal96      13              heterogenous               +----------+--------+--------+--------+------------+--------------+ ICA Prox  54      6       1-39%   heterogenous               +----------+--------+--------+--------+------------+--------------+ ICA Distal                                    Not visualized +----------+--------+--------+--------+------------+--------------+ ECA       101                                                +----------+--------+--------+--------+------------+--------------+ +----------+--------+--------+--------+-------------------+ SubclavianPSV cm/sEDV cm/sDescribeArm Pressure (mmHG) +----------+--------+--------+--------+-------------------+           111                                         +----------+--------+--------+--------+-------------------+ +---------+--------+--------+--------------+ VertebralPSV cm/sEDV cm/snot visualized +---------+--------+--------+--------------+  Summary: Right Carotid: Velocities in the right ICA are consistent with a 1-39% stenosis. Left Carotid: Velocities in the left ICA are consistent with a 1-39% stenosis. Vertebrals: Right vertebral artery demonstrates antegrade flow. Left vertebral             artery was not visualized. *See table(s) above for measurements and observations.  Electronically signed by Coral Else MD on 08/18/2018 at 1:07:47 PM.    Final     Scheduled Meds: . aspirin EC  325 mg Oral Daily  . atorvastatin  80 mg Oral q1800  . cinacalcet  90 mg Oral Q M,W,F  . clopidogrel  75 mg Oral Daily  . enoxaparin (LOVENOX) injection  40 mg  Subcutaneous Q24H  . furosemide  40 mg Oral Daily  . labetalol  300 mg Oral BID  . magnesium oxide  800 mg Oral BID  . mycophenolate  720 mg Oral BID  . predniSONE  5 mg Oral Daily  . sulfamethoxazole-trimethoprim  1 tablet Oral Q M,W,F  . tacrolimus  5 mg Oral BID   Continuous Infusions:  Principal Problem:   CVA (cerebral vascular accident) (HCC) Active Problems:   Renal transplant, status post   CKD (chronic kidney disease), stage III (HCC)   Hypertensive urgency   Obesity, Class III, BMI 40-49.9 (morbid obesity) (HCC)   Tobacco use   OSA (obstructive sleep apnea)   Cytotoxic brain edema (HCC)      Laverna Peace  [facility]

## 2018-08-19 DIAGNOSIS — R51 Headache: Secondary | ICD-10-CM

## 2018-08-19 DIAGNOSIS — I6300 Cerebral infarction due to thrombosis of unspecified precerebral artery: Secondary | ICD-10-CM

## 2018-08-19 DIAGNOSIS — G936 Cerebral edema: Secondary | ICD-10-CM

## 2018-08-19 MED ORDER — CLOPIDOGREL BISULFATE 75 MG PO TABS: 75.0000 mg | ORAL_TABLET | Freq: Every day | ORAL | 0 refills | Status: AC

## 2018-08-19 MED ORDER — ASPIRIN 325 MG PO TBEC: 325.0000 mg | DELAYED_RELEASE_TABLET | Freq: Every day | ORAL | 1 refills | Status: AC

## 2018-08-19 MED ORDER — ATORVASTATIN CALCIUM 80 MG PO TABS: 80.0000 mg | ORAL_TABLET | Freq: Every day | ORAL | 1 refills | Status: AC

## 2018-08-19 NOTE — Progress Notes (Signed)
STROKE TEAM PROGRESS NOTE    SUBJECTIVE (INTERVAL HISTORY) He is doing well Neuro exam stable.   OBJECTIVE Vitals:   08/19/18 0015 08/19/18 0055 08/19/18 0432 08/19/18 0727  BP:  (!) 156/85 (!) 179/88 (!) 156/85  Pulse: 75 65 62 71  Resp: 19 (!) 21 18 15   Temp:  97.8 F (36.6 C) 98.1 F (36.7 C) 98 F (36.7 C)  TempSrc:  Oral Oral Oral  SpO2: 96% 91% 94% 98%  Weight:      Height:        CBC:  Recent Labs  Lab 08/17/18 0750  WBC 6.3  NEUTROABS 4.8  HGB 14.9  HCT 48.7  MCV 88.2  PLT 181    Basic Metabolic Panel:  Recent Labs  Lab 08/17/18 0750 08/18/18 0340  NA 139  --   K 4.1  --   CL 108  --   CO2 24  --   GLUCOSE 131*  --   BUN 16  --   CREATININE 1.48*  --   CALCIUM 10.0  --   MG  --  1.7    Lipid Panel:     Component Value Date/Time   CHOL 187 08/18/2018 0340   TRIG 162 (H) 08/18/2018 0340   HDL 38 (L) 08/18/2018 0340   CHOLHDL 4.9 08/18/2018 0340   VLDL 32 08/18/2018 0340   LDLCALC 117 (H) 08/18/2018 0340   HgbA1c:  Lab Results  Component Value Date   HGBA1C 6.0 (H) 08/18/2018   Urine Drug Screen: No results found for: LABOPIA, COCAINSCRNUR, LABBENZ, AMPHETMU, THCU, LABBARB  Alcohol Level No results found for: ETH  IMAGING   Ct Head Wo Contrast  Result Date: 08/18/2018 CLINICAL DATA:  Stroke follow-up EXAM: CT HEAD WITHOUT CONTRAST TECHNIQUE: Contiguous axial images were obtained from the base of the skull through the vertex without intravenous contrast. COMPARISON:  Brain MRI from yesterday FINDINGS: Brain: Acute left PICA territory infarct with lower fourth ventricular effacement. No evidence of infarct progression. No hemorrhagic conversion. Negative for hydrocephalus. Vascular: No acute finding.  MRA was performed yesterday. Skull: Negative Sinuses/Orbits: Negative IMPRESSION: Acute left PICA territory infarct with lower fourth ventricular narrowing. No hydrocephalus or hemorrhagic conversion. Electronically Signed   By: Marnee Spring  M.D.   On: 08/18/2018 04:27   Vas US Carotid (at [facility])  Result Date: 08/18/2018 Carotid Arterial Duplex Study Indications: CVA. Limitations: patient body habitus Performing Technologist: Blanch Media RVS  Examination Guidelines: A complete evaluation includes B-mode imaging, spectral Doppler, color Doppler, and power Doppler as needed of all accessible portions of each vessel. Bilateral testing is considered an integral part of a complete examination. Limited examinations for reoccurring indications may be performed as noted.  Right Carotid Findings: +----------+--------+--------+--------+------------+--------+           PSV cm/sEDV cm/sStenosisDescribe    Comments +----------+--------+--------+--------+------------+--------+ CCA Prox  75                      heterogenous         +----------+--------+--------+--------+------------+--------+ CCA Distal64      11              heterogenous         +----------+--------+--------+--------+------------+--------+ ICA Prox  40      7       1-39%   heterogenous         +----------+--------+--------+--------+------------+--------+ ICA Distal78      19                                   +----------+--------+--------+--------+------------+--------+  ECA       129     10                                   +----------+--------+--------+--------+------------+--------+ +----------+--------+-------+--------+-------------------+           PSV cm/sEDV cmsDescribeArm Pressure (mmHG) +----------+--------+-------+--------+-------------------+ Subclavian150                                        +----------+--------+-------+--------+-------------------+ +---------+--------+--+--------+--+---------+ VertebralPSV cm/s81EDV cm/s12Antegrade +---------+--------+--+--------+--+---------+  Left Carotid Findings: +----------+--------+--------+--------+------------+--------------+           PSV cm/sEDV cm/sStenosisDescribe     Comments       +----------+--------+--------+--------+------------+--------------+ CCA Prox  111     10              heterogenous               +----------+--------+--------+--------+------------+--------------+ CCA Distal96      13              heterogenous               +----------+--------+--------+--------+------------+--------------+ ICA Prox  54      6       1-39%   heterogenous               +----------+--------+--------+--------+------------+--------------+ ICA Distal                                    Not visualized +----------+--------+--------+--------+------------+--------------+ ECA       101                                                +----------+--------+--------+--------+------------+--------------+ +----------+--------+--------+--------+-------------------+ SubclavianPSV cm/sEDV cm/sDescribeArm Pressure (mmHG) +----------+--------+--------+--------+-------------------+           111                                         +----------+--------+--------+--------+-------------------+ +---------+--------+--------+--------------+ VertebralPSV cm/sEDV cm/snot visualized +---------+--------+--------+--------------+  Summary: Right Carotid: Velocities in the right ICA are consistent with a 1-39% stenosis. Left Carotid: Velocities in the left ICA are consistent with a 1-39% stenosis. Vertebrals: Right vertebral artery demonstrates antegrade flow. Left vertebral             artery was not visualized. *See table(s) above for measurements and observations.  Electronically signed by Coral ElseVance Brabham MD on 08/18/2018 at 1:07:47 PM.    Final    Transthoracic Echocardiogram  00/00/2020 Pending  PHYSICAL EXAM Blood pressure (!) 156/85, pulse 71, temperature 98 F (36.7 C), temperature source Oral, resp. rate 15, height 6\' 1"  (1.854 m), weight (!) 184.6 kg, SpO2 98 %. Morbidly obese young African-American male Constitutional: Supermorbid obesity.   Psych: Affect appropriate to situation Eyes: No scleral injection HENT: No OP obstrucion Head: Normocephalic.  Cardiovascular: Normal rate and regular rhythm.  Respiratory: Effort normal, non-labored breathing GI: Soft.  No distension. There is no tenderness.  Skin: WDI  Neuro: Mental Status: Patient is awake, alert, oriented to person, place, month, year, and situation. Patient is able to give a  clear and coherent history. No signs of aphasia or neglect Cranial Nerves: II: Visual Fields are full.  III,IV, VI: EOMI without ptosis or diploplia. Pupils equal, round and reactive to light.  Saccadic dysmetria on left lateral gaze. V: Facial sensation is symmetric to temperature VII: Facial movement is symmetric.  VIII: hearing is intact to voice X: Palat elevates symmetrically XI: Shoulder shrug is symmetric. XII: tongue is midline without atrophy or fasciculations.  Motor: Tone is normal. Bulk is normal. 5/5 strength was present in all four extremities.  Sensory: Sensation is symmetric to light touch and temperature in the arms and legs. Deep Tendon Reflexes: 2+ and symmetric in the biceps and patellae.  Plantars: Toes are downgoing bilaterally.  Cerebellar: Right side is WNL. Left FNF and RAM are ataxic/dysmetric. H to S without significant abnormality bilaterally.   HOME MEDICATIONS:  No medications prior to admission.      HOSPITAL MEDICATIONS:  . aspirin EC  325 mg Oral Daily  . atorvastatin  80 mg Oral q1800  . cinacalcet  90 mg Oral Q M,W,F  . clopidogrel  75 mg Oral Daily  . enoxaparin (LOVENOX) injection  40 mg Subcutaneous Q24H  . furosemide  40 mg Oral Daily  . labetalol  300 mg Oral BID  . magnesium oxide  800 mg Oral BID  . mycophenolate  720 mg Oral BID  . predniSONE  5 mg Oral Daily  . sulfamethoxazole-trimethoprim  1 tablet Oral Q M,W,F  . tacrolimus  5 mg Oral BID    ALLERGIES Allergies  Allergen Reactions  . Penicillins Anaphylaxis and Other  (See Comments)    Has patient had a PCN reaction causing immediate rash, facial/tongue/throat swelling, SOB or lightheadedness with hypotension: Yes Has patient had a PCN reaction causing severe rash involving mucus membranes or skin necrosis: Yes Has patient had a PCN reaction that required hospitalization Yes Has patient had a PCN reaction occurring within the last 10 years: Unknown If all of the above answers are "NO", then may proceed with Cephalosporin use.  . Wellbutrin [Bupropion] Rash    ASSESSMENT/PLAN Mr. LUDIE PAVLIK is a 43 y.o. male with history of 43 y.o. male presenting with a large subacute left cerebellar infarct affecting 1/3-1/2 of the left cerebellar hemisphere.  There is swelling with slight mass-effect on the fourth ventricle. His symptoms wax/waned for few days before he sought medical attention, therefore no IV tPA offered. He is almost out of the 72-hour time window during which swelling would be most concerning. After 7 days have elapsed, risk of further mass effect with herniation would be low.   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   CT head left cerebellar infarct  MRI head - Left cerebellar infarct  MRA head -Lvert is small and irreg, cut off at L PICA  Carotid Doppler - b/l 1-395 ICA stenosis  2D Echo - Ef 60-65%  Sars Corona Virus 2 Neg  LDL - 117  HgbA1c - 6.0  UDS - n/a  VTE prophylaxis -Lovenox  Diet heart healthy  none prior to admission, now on Plavix + ASA for 3 months given degree of intracranial disease, then monotherapy with ASA only.   Patient counseled to be compliant with his antithrombotic medications  Ongoing aggressive stroke risk factor management  Therapy recommendations:  Home; rehab services have s/o as no needs identified  Disposition: d/c home tomorrow  Hypertension  Stable . No further  role of permissive hypertension  . Long-term  BP goal normotensive  Hyperlipidemia  Lipid lowering medication PTA: none  LDL 117, goal < 70  Current lipid lowering medication:Lipitor  QD  Continue statin at discharge  Diabetes  HgbA1c 6.0, goal < 7.0  Controlled  Other Stroke Risk Factors  Cigarette smoker; advised to stop smoking  Morbid Obesity, Body mass index is 53.7 kg/m., recommend weight loss, diet and exercise as   Obstructive sleep apnea, fails to wear CPAP. Needs out pt f/u with [facility].  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  He is at risk for cytotoxic edema and development of hydrocephalus and needs close neurological monitoring.    Continue aspirin and Plavix and aggressive risk factor modification.  Patient counseled to be compliant with sleep apnea treatment.  He was also counseled to lose weight.  Discussed with Dr. Caleb Popp  F/u as outpt in stroke clinic in 6 weeks     Delia Heady, MD Medical Director [facility] Pager: [phone] 08/19/2018 2:10 PM  To contact Stroke Continuity provider, please refer to [email or web address]. After hours, contact General Neurology

## 2018-08-19 NOTE — Plan of Care (Signed)
Progressing toward goals. 

## 2018-08-19 NOTE — Discharge Summary (Addendum)
Discharge Summary  Bradley [name] ZOX:[id number] DOB: Feb 06, 1976  PCP: Karle Plumber, MD  Admit date: 08/17/2018 Discharge date: 08/19/2018   Time spent: < 25 minutes  Admitted From: home Disposition:  home  Recommendations for Outpatient Follow-up:  1. Follow up with PCP in 1 week, neurology f/u arranged 2. New medications: aspirin, plavix x 3 months followed by aspirin alone,  atorvastatin 3.     Discharge Diagnoses:  Active Hospital Problems   Diagnosis Date Noted   CVA (cerebral vascular accident) (HCC) 08/17/2018   Cytotoxic brain edema (HCC) 08/18/2018   Renal transplant, status post 08/17/2018   CKD (chronic kidney disease), stage III (HCC) 08/17/2018   Hypertensive urgency 08/17/2018   Obesity, Class III, BMI 40-49.9 (morbid obesity) (HCC) 08/17/2018   Tobacco use 08/17/2018   OSA (obstructive sleep apnea) 08/17/2018    Resolved Hospital Problems  No resolved problems to display.    Discharge Condition: Stable   CODE STATUS:FULL    History of present illness:   Bradley [name] is a 43 y.o. 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  Remaining hospital course addressed in problem based format below:   Hospital Course:   1. Left posterior inferior cerebellar infarct.  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  LDL 117, A1c 6, TTE shows normal EF.  No significant stenosis of bilateral carotid arteries.  Neurology recommends Plavix and aspirin for 3 months given degree of intracranial disease, followed by  therapy with aspirin alone.  Patient was monitored and had repeat CT head due to location of infarct and edema noted on MRI  imaging. His repeat scans did not show any hemorrhagic conversion or hydrocephalus. Aggressive risk factor management (advised smoking cessation, recommended weight loss, encourage outpatient follow-up with sleep center for OSA.). Follow up with Neurology in 6 weeks arranged.  2. Hyperlipidemia, LDL 117, initiated Lipitor 80 mg will continue on discharge. 3. Hypertension.  continue home labetalol monitor to maintain normotensive pressures 4. Renal transplant, stable.  Continue home Myfortic, prednisone, Bactrim, Prograf.   Consultations:  Neurology  Procedures/Studies: TTE, 5/27  The left ventricle has normal systolic function with an ejection fraction of 60-65%. The cavity size was mildly dilated. There is mildly increased left ventricular wall thickness. Left ventricular diastolic Doppler parameters are indeterminate.  Indeterminate filling pressures.  2. Intracavitary gradient noted. Vendetti velocity 3.42 m/s. Hoppes gradient 46.8 m/s.  3. The right ventricle has normal systolic function. The cavity was normal. There is no increase in right ventricular wall thickness.  4. No evidence of mitral valve stenosis.  5. The aortic valve is tricuspid. No stenosis of the aortic valve.  6. The ascending aorta is normal in size and structure.  7. The inferior vena cava was dilated in size with >50% respiratory variability.  Carotid duplex, 5/27 Right Carotid: Velocities in the right ICA are consistent with a 1-39% stenosis.  Left Carotid: Velocities in the left ICA are consistent with a 1-39% stenosis.  Vertebrals: Right vertebral artery demonstrates antegrade flow. Left vertebral             artery was not visualized.   Discharge Exam: BP (!) 156/85 (BP Location: Left Arm)    Pulse 71  Temp 98 F (36.7 C) (Oral)    Resp 15    Ht  (1.854 m)    Wt (!) 184.6 kg    SpO2 98%    BMI 53.70 kg/m    General:  Morbidly obese, no distress  Cardiovascular: RRR, no edema  Respiratory: normal  effort on room air,  Abdomen: soft obese abdomen, nontender  Musculoskeletal: normal ROM   Skin no rashes or lesions  Neurologic alert and oriented x 4, no appreciable focal deficits   Discharge Instructions You were cared for by a hospitalist during your hospital stay. If you have any questions about your discharge medications or the care you received while you were in the hospital after you are discharged, you can call the unit and asked to speak with the hospitalist on call if the hospitalist that took care of you is not available. Once you are discharged, your primary care physician will handle any further medical issues. Please note that NO REFILLS for any discharge medications will be authorized once you are discharged, as it is imperative that you return to your primary care physician (or establish a relationship with a primary care physician if you do not have one) for your aftercare needs so that they can reassess your need for medications and monitor your lab values.  Discharge Instructions    Diet - low sodium heart healthy   Complete by:  As directed    Increase activity slowly   Complete by:  As directed      Allergies as of 08/19/2018      Reactions   Penicillins Anaphylaxis, Other (See Comments)   Has patient had a PCN reaction causing immediate rash, facial/tongue/throat swelling, SOB or lightheadedness with hypotension: Yes Has patient had a PCN reaction causing severe rash involving mucus membranes or skin necrosis: Yes Has patient had a PCN reaction that required hospitalization Yes Has patient had a PCN reaction occurring within the last 10 years: Unknown If all of the above answers are "NO", then may proceed with Cephalosporin use.   Wellbutrin [bupropion] Rash      Medication List    STOP taking these medications   cyclobenzaprine 10 MG tablet Commonly known as:  FLEXERIL   oxyCODONE-acetaminophen 5-325 MG tablet Commonly known as:  Percocet     TAKE  these medications   allopurinol 300 MG tablet Commonly known as:  ZYLOPRIM Take 300 mg by mouth daily.   aspirin 325 MG EC tablet Take 1 tablet (325 mg total) by mouth daily.   atorvastatin 80 MG tablet Commonly known as:  LIPITOR Take 1 tablet (80 mg total) by mouth daily at 6 PM.   cinacalcet 90 MG tablet Commonly known as:  SENSIPAR Take 90 mg by mouth See admin instructions. Mon, Wed, Fri   clopidogrel 75 MG tablet Commonly known as:  PLAVIX Take 1 tablet (75 mg total) by mouth daily.   furosemide 20 MG tablet Commonly known as:  LASIX Take 40 mg by mouth daily.   labetalol 300 MG tablet Commonly known as:  NORMODYNE Take 300 mg by mouth 2 (two) times daily.   loperamide 2 MG capsule Commonly known as:  IMODIUM Take 2 capsules (4 mg total) by mouth 4 (four) times daily as needed for diarrhea or loose stools.   magnesium oxide 400 MG tablet Commonly known as:  MAG-OX Take 800 mg by mouth 2 (two) times daily.   mycophenolate 180 MG EC tablet Commonly known as:  MYFORTIC Take  720 mg by mouth 2 (two) times daily.   Oxycodone HCl 10 MG Tabs Take 10 mg by mouth 3 (three) times daily as needed for pain.   predniSONE 5 MG tablet Commonly known as:  DELTASONE Take 5 mg by mouth daily.   sulfamethoxazole-trimethoprim 400-80 MG tablet Commonly known as:  BACTRIM Take 1 tablet by mouth See admin instructions. Monday, wed, fri   tacrolimus 1 MG capsule Commonly known as:  PROGRAF Take 5 mg by mouth 2 (two) times daily.   traMADol 50 MG tablet Commonly known as:  ULTRAM Take 1 tablet (50 mg total) by mouth every 6 (six) hours as needed.   Vitamin D (Ergocalciferol) 1.25 MG (50000 UT) Caps capsule Commonly known as:  DRISDOL Take 50,000 Units by mouth once a week. Wednesday      Allergies  Allergen Reactions   Penicillins Anaphylaxis and Other (See Comments)    Has patient had a PCN reaction causing immediate rash, facial/tongue/throat swelling, SOB or  lightheadedness with hypotension: Yes Has patient had a PCN reaction causing severe rash involving mucus membranes or skin necrosis: Yes Has patient had a PCN reaction that required hospitalization Yes Has patient had a PCN reaction occurring within the last 10 years: Unknown If all of the above answers are "NO", then may proceed with Cephalosporin use.   Wellbutrin [Bupropion] Rash   Follow-up Information    Karle Plumber, MD.   Specialty:  Internal Medicine Contact information: [phone] [place] [address] [phone]        Dohmeier, Porfirio Mylar, MD. Schedule an appointment as soon as possible for a visit in 4 week(s).   Specialty:  Neurology Why:  Need out pt sleep study for OSA Contact information: [address] [place] [address] [phone]            The results of significant diagnostics from this hospitalization (including imaging, microbiology, ancillary and laboratory) are listed below for reference.    Significant Diagnostic Studies: Dg Chest 2 View  Result Date: 08/17/2018 CLINICAL DATA:  Headache. EXAM: CHEST - 2 VIEW COMPARISON:  04/16/2017. FINDINGS: Increased body habitus degrades image quality, but overall the study is diagnostic. The heart size and mediastinal contours are within normal limits. Both lungs are clear. The visualized skeletal structures are unremarkable. IMPRESSION: No active cardiopulmonary disease. Stable from priors. Electronically Signed   By: Elsie Stain M.D.   On: 08/17/2018 11:01   Ct Head Wo Contrast  Result Date: 08/18/2018 CLINICAL DATA:  Stroke follow-up EXAM: CT HEAD WITHOUT CONTRAST TECHNIQUE: Contiguous axial images were obtained from the base of the skull through the vertex without intravenous contrast. COMPARISON:  Brain MRI from yesterday FINDINGS: Brain: Acute left PICA territory infarct with lower fourth ventricular effacement. No evidence of infarct progression. No hemorrhagic conversion. Negative  for hydrocephalus. Vascular: No acute finding.  MRA was performed yesterday. Skull: Negative Sinuses/Orbits: Negative IMPRESSION: Acute left PICA territory infarct with lower fourth ventricular narrowing. No hydrocephalus or hemorrhagic conversion. Electronically Signed   By: Marnee Spring M.D.   On: 08/18/2018 04:27   Ct Head Wo Contrast  Result Date: 08/17/2018 CLINICAL DATA:  Headache for 2 days, no nausea or vomiting. History of hypertension. History of end-stage renal disease. EXAM: CT HEAD WITHOUT CONTRAST TECHNIQUE: Contiguous axial images were obtained from the base of the skull through the vertex without intravenous contrast. COMPARISON:  None. FINDINGS: Brain: There is a well-defined area of hypoattenuation in the LEFT inferior cerebellum, corresponding  to much of the LEFT posterior inferior cerebellar artery territory, consistent with acute infarction. Slight mass effect on the fourth ventricle, and slight LEFT-to-RIGHT shift, without visible hemorrhage. Elsewhere, no similar areas of hypoattenuation. No hydrocephalus, extra-axial fluid, or mass lesion. Vascular: Calcification of the cavernous internal carotid arteries consistent with cerebrovascular atherosclerotic disease. No signs of intracranial large vessel occlusion. Skull: Normal. Negative for fracture or focal lesion. Sinuses/Orbits: No acute finding. Other: None. 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 Slight mass effect on the fourth ventricle and slight RIGHT-to-LEFT shift. Close clinical observation is warranted. These results were called by telephone at the time of interpretation on 08/17/2018 at 8:40 am to Dr. Shaune Pollack , who verbally acknowledged these results. Electronically Signed   By: Elsie Stain M.D.   On: 08/17/2018 08:41   Mr Brain Wo Contrast  Result Date: 08/17/2018 CLINICAL DATA:  Headache over the last 2 days. Left cerebellar stroke by CT. EXAM: MRI HEAD  WITHOUT CONTRAST MRA HEAD WITHOUT CONTRAST TECHNIQUE: Multiplanar, multiecho pulse sequences of the brain and surrounding structures were obtained without intravenous contrast. Angiographic images of the head were obtained using MRA technique without contrast. COMPARISON:  Head CT same day FINDINGS: MRI HEAD FINDINGS Brain: There is acute infarction affecting the inferior 1/3 to 1/2 of the cerebellum on the left consistent with posterior inferior cerebellar artery territory infarction. No evidence of involvement the medulla. Right cerebellum is normal. Mild swelling but no sign of hemorrhage or ventricular obstruction. Cerebral hemispheres are normal without evidence of old or recent small or large vessel infarction. No mass lesion, hydrocephalus or extra-axial collection. Vascular: Major vessels at the base of the brain show flow. Skull and upper cervical spine: Negative Sinuses/Orbits: Clear/normal Other: None MRA HEAD FINDINGS Both internal carotid arteries are widely patent into the brain. No siphon stenosis. The anterior and middle cerebral vessels are patent without proximal stenosis, aneurysm or vascular malformation. The right vertebral artery is dominant and widely patent to the basilar, giving the majority of the basilar artery supply. Large right PICA is noted. The left vertebral artery is a small vessel in does appear to show some irregularity. There may be minor amount of flow and left PICA presently. No basilar stenosis. Superior cerebellar and posterior cerebral arteries appear normal. IMPRESSION: Acute left posterior inferior cerebellar artery territory infarction affecting the inferior 1/3 to 1/2 of the left cerebellum. Swelling but no evidence of hemorrhage. No evidence of ventricular obstruction at this moment. Brain otherwise appears normal. MRA shows a normal appearing anterior circulation and a normal appearing right vertebral artery. Left vertebral artery is small and somewhat irregular. There  is minimal flow visible within a left PICA based on the source images. Electronically Signed   By: Paulina Fusi M.D.   On: 08/17/2018 13:51   Mr Maxine Glenn Head Wo Contrast  Result Date: 08/17/2018 CLINICAL DATA:  Headache over the last 2 days. Left cerebellar stroke by CT. EXAM: MRI HEAD WITHOUT CONTRAST MRA HEAD WITHOUT CONTRAST TECHNIQUE: Multiplanar, multiecho pulse sequences of the brain and surrounding structures were obtained without intravenous contrast. Angiographic images of the head were obtained using MRA technique without contrast. COMPARISON:  Head CT same day FINDINGS: MRI HEAD FINDINGS Brain: There is acute infarction affecting the inferior 1/3 to 1/2 of the cerebellum on the left consistent with posterior inferior cerebellar artery territory infarction. No evidence of involvement the medulla. Right cerebellum is normal. Mild swelling but no sign of hemorrhage or ventricular obstruction.  Cerebral hemispheres are normal without evidence of old or recent small or large vessel infarction. No mass lesion, hydrocephalus or extra-axial collection. Vascular: Major vessels at the base of the brain show flow. Skull and upper cervical spine: Negative Sinuses/Orbits: Clear/normal Other: None MRA HEAD FINDINGS Both internal carotid arteries are widely patent into the brain. No siphon stenosis. The anterior and middle cerebral vessels are patent without proximal stenosis, aneurysm or vascular malformation. The right vertebral artery is dominant and widely patent to the basilar, giving the majority of the basilar artery supply. Large right PICA is noted. The left vertebral artery is a small vessel in does appear to show some irregularity. There may be minor amount of flow and left PICA presently. No basilar stenosis. Superior cerebellar and posterior cerebral arteries appear normal. IMPRESSION: Acute left posterior inferior cerebellar artery territory infarction affecting the inferior 1/3 to 1/2 of the left  cerebellum. Swelling but no evidence of hemorrhage. No evidence of ventricular obstruction at this moment. Brain otherwise appears normal. MRA shows a normal appearing anterior circulation and a normal appearing right vertebral artery. Left vertebral artery is small and somewhat irregular. There is minimal flow visible within a left PICA based on the source images. Electronically Signed   By: Paulina FusiMark  Shogry M.D.   On: 08/17/2018 13:51   [facility] [place] [facility]id [facility]  Result Date: 08/18/2018 Carotid Arterial Duplex Study Indications: CVA. Limitations: patient body habitus Performing Technologist: Blanch MediaMegan Riddle RVS  Examination Guidelines: A complete evaluation includes B-mode imaging, spectral Doppler, color Doppler, and power Doppler as needed of all accessible portions of each vessel. Bilateral testing is considered an integral part of a complete examination. Limited examinations for reoccurring indications may be performed as noted.  Right Carotid Findings: +----------+--------+--------+--------+------------+--------+             PSV cm/s EDV cm/s Stenosis Describe     Comments  +----------+--------+--------+--------+------------+--------+  CCA Prox   75                         heterogenous           +----------+--------+--------+--------+------------+--------+  CCA Distal 64       11                heterogenous           +----------+--------+--------+--------+------------+--------+  ICA Prox   40       7        1-39%    heterogenous           +----------+--------+--------+--------+------------+--------+  ICA Distal 78       19                                       +----------+--------+--------+--------+------------+--------+  ECA        129      10                                       +----------+--------+--------+--------+------------+--------+ +----------+--------+-------+--------+-------------------+             PSV cm/s EDV cms Describe Arm Pressure (mmHG)   +----------+--------+-------+--------+-------------------+  Subclavian 150                                            +----------+--------+-------+--------+-------------------+ +---------+--------+--+--------+--+---------+  Vertebral PSV cm/s 81 EDV cm/s 12 Antegrade  +---------+--------+--+--------+--+---------+  Left Carotid Findings: +----------+--------+--------+--------+------------+--------------+             PSV cm/s EDV cm/s Stenosis Describe     Comments        +----------+--------+--------+--------+------------+--------------+  CCA Prox   111      10                heterogenous                 +----------+--------+--------+--------+------------+--------------+  CCA Distal 96       13                heterogenous                 +----------+--------+--------+--------+------------+--------------+  ICA Prox   54       6        1-39%    heterogenous                 +----------+--------+--------+--------+------------+--------------+  ICA Distal                                         Not visualized  +----------+--------+--------+--------+------------+--------------+  ECA        101                                                     +----------+--------+--------+--------+------------+--------------+ +----------+--------+--------+--------+-------------------+  Subclavian PSV cm/s EDV cm/s Describe Arm Pressure (mmHG)  +----------+--------+--------+--------+-------------------+             111                                             +----------+--------+--------+--------+-------------------+ +---------+--------+--------+--------------+  Vertebral PSV cm/s EDV cm/s not visualized  +---------+--------+--------+--------------+  Summary: Right Carotid: Velocities in the right ICA are consistent with a 1-39% stenosis. Left Carotid: Velocities in the left ICA are consistent with a 1-39% stenosis. Vertebrals: Right vertebral artery demonstrates antegrade flow. Left vertebral             artery was not visualized.  *See table(s) above for measurements and observations.  Electronically signed by Coral Else MD on 08/18/2018 at 1:07:47 PM.    Final     Microbiology: Recent Results (from the past 240 hour(s))  SARS Coronavirus 2 (CEPHEID - Performed in [facility] hospital lab), Hosp Order     Status: None   Collection Time: 08/17/18 10:44 AM  Result Value Ref Range Status   SARS Coronavirus 2 NEGATIVE NEGATIVE Final    Comment: (NOTE) If result is NEGATIVE SARS-CoV-2 target nucleic acids are NOT DETECTED. The SARS-CoV-2 RNA is generally detectable in upper and lower  respiratory specimens during the acute phase of infection. The lowest  concentration of SARS-CoV-2 viral copies this assay can detect is 250  copies / mL. A negative result does not preclude SARS-CoV-2 infection  and should not be used as the sole basis for treatment or other  patient management decisions.  A negative result may occur with  improper specimen collection / handling, submission of specimen other  than nasopharyngeal swab, presence  of viral mutation(s) within the  areas targeted by this assay, and inadequate number of viral copies  (<250 copies / mL). A negative result must be combined with clinical  observations, patient history, and epidemiological information. If result is POSITIVE SARS-CoV-2 target nucleic acids are DETECTED. The SARS-CoV-2 RNA is generally detectable in upper and lower  respiratory specimens dur ing the acute phase of infection.  Positive  results are indicative of active infection with SARS-CoV-2.  Clinical  correlation with patient history and other diagnostic information is  necessary to determine patient infection status.  Positive results do  not rule out bacterial infection or co-infection with other viruses. If result is PRESUMPTIVE POSTIVE SARS-CoV-2 nucleic acids MAY BE PRESENT.   A presumptive positive result was obtained on the submitted specimen  and confirmed on repeat testing.   While 2019 novel coronavirus  (SARS-CoV-2) nucleic acids may be present in the submitted sample  additional confirmatory testing may be necessary for epidemiological  and / or clinical management purposes  to differentiate between  SARS-CoV-2 and other Sarbecovirus currently known to infect humans.  If clinically indicated additional testing with an alternate test  methodology [phone]) is advised. The SARS-CoV-2 RNA is generally  detectable in upper and lower respiratory sp ecimens during the acute  phase of infection. The expected result is Negative. Fact Sheet for Patients:  BoilerBrush.com.cy Fact Sheet for Healthcare Providers: https://pope.com/ This test is not yet approved or cleared by the [place] FDA and has been authorized for detection and/or diagnosis of SARS-CoV-2 by FDA under an Emergency Use Authorization (EUA).  This EUA will remain in effect (meaning this test can be used) for the duration of the COVID-19 declaration under Section 564(b)(1) of the Act, 21 U.S.C. section 360bbb-3(b)(1), unless the authorization is terminated or revoked sooner. Performed at [facility], [address], [place], [place] [address]      Labs: Basic Metabolic Panel: Recent Labs  Lab 08/17/18 0750 08/18/18 0340  NA 139  --   K 4.1  --   CL 108  --   CO2 24  --   GLUCOSE 131*  --   BUN 16  --   CREATININE 1.48*  --   CALCIUM 10.0  --   MG  --  1.7   Liver Function Tests: No results for input(s): AST, ALT, ALKPHOS, BILITOT, PROT, ALBUMIN in the last 168 hours. No results for input(s): LIPASE, AMYLASE in the last 168 hours. No results for input(s): AMMONIA in the last 168 hours. CBC: Recent Labs  Lab 08/17/18 0750  WBC 6.3  NEUTROABS 4.8  HGB 14.9  HCT 48.7  MCV 88.2  PLT 181   Cardiac Enzymes: Recent Labs  Lab 08/17/18 0901  TROPONINI <0.03   BNP: BNP (last 3 results) No results for input(s): BNP in  the last 8760 hours.  ProBNP (last 3 results) No results for input(s): PROBNP in the last 8760 hours.  CBG: No results for input(s): GLUCAP in the last 168 hours.     Signed:  Laverna Peace, MD [facility] 08/19/2018, 11:01 AM

## 2018-08-19 NOTE — Progress Notes (Signed)
Patient being discharged home, transported by spouse. IV removed with the catheter intact.  Discharge and prescription information given with the patient verbalizing understanding.

## 2018-08-19 NOTE — Progress Notes (Signed)
Pt placed on CPAP tolerating well. 

## 2018-08-19 NOTE — TOC Transition Note (Signed)
Transition of Care [facility]) - CM/SW Discharge Note   Patient Details  Name: DHYEY MCGEE MRN: [id number] Date of Birth: 07/18/1975  Transition of [facility]) CM/SW Contact:  Kermit Balo, RN Phone Number: 08/19/2018, 1:29 PM   Clinical Narrative:    Pt discharging home with self care. No f/u per PT/OT and no DME needs. Pt has intermittent supervision at home. Pt has transportation home.   Final next level of care: Home/Self Care Barriers to Discharge: Barriers Resolved   Patient Goals and CMS Choice Patient states their goals for this hospitalization and ongoing recovery are:: to get back to his job      Discharge Placement                       Discharge Plan and Services                                     Social Determinants of Health (SDOH) Interventions     Readmission Risk Interventions No flowsheet data found.

## 2019-11-17 IMAGING — CT CT HEAD WITHOUT CONTRAST
4 series · 17 of 47 positions shown, 19 images · non-contrast
Comparison: Brain MRI from yesterday

CLINICAL DATA: Stroke follow-up

EXAM:
CT HEAD WITHOUT CONTRAST
TECHNIQUE: Contiguous axial images were obtained from the base of the skull
through the vertex without intravenous contrast.

[Series 3: head wo · axial · 0.48mm/px · z∈[-138,+6]mm · 7 of 39 slices shown, 9 images]
[im 5/39  brain]
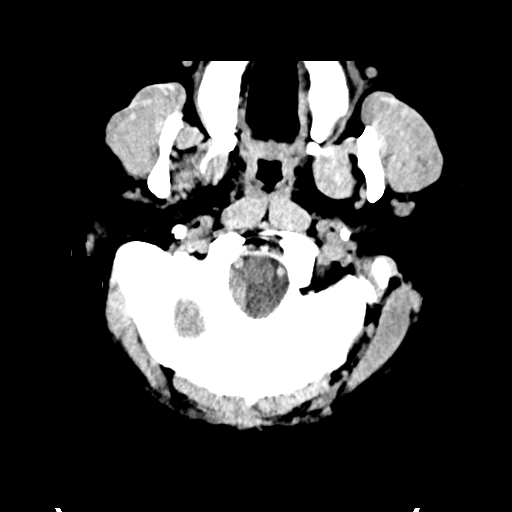
[im 5/39  bone]
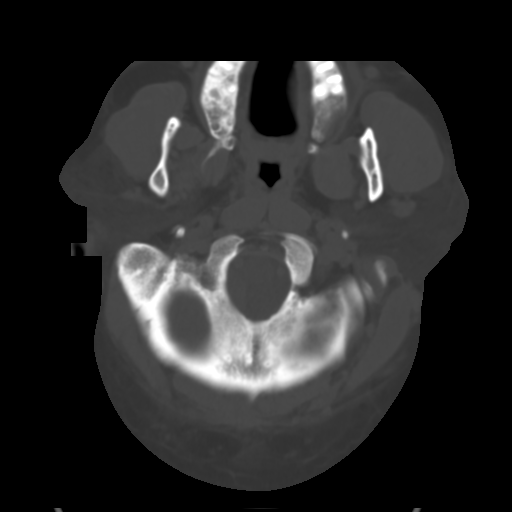
[im 10/39  brain]
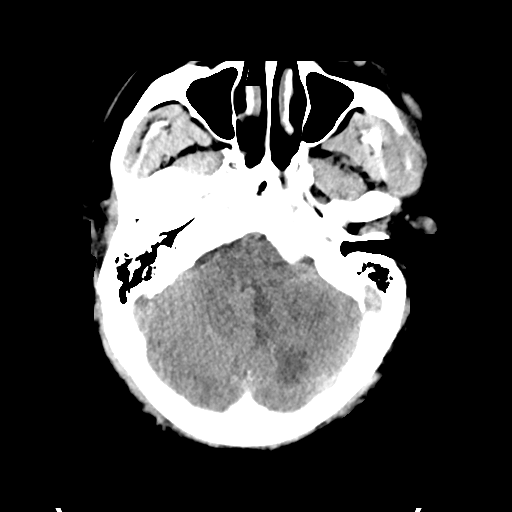
[im 15/39  brain]
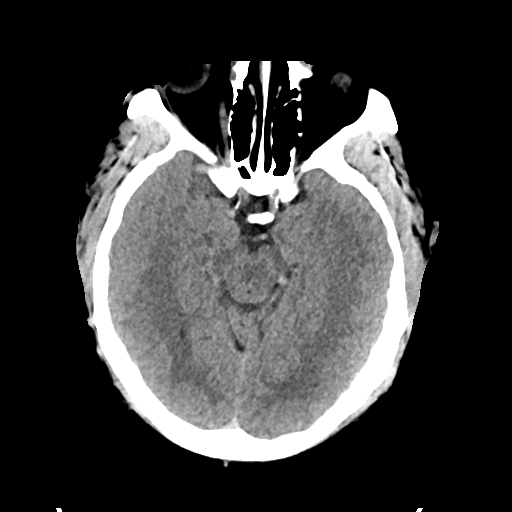
[im 20/39  brain]
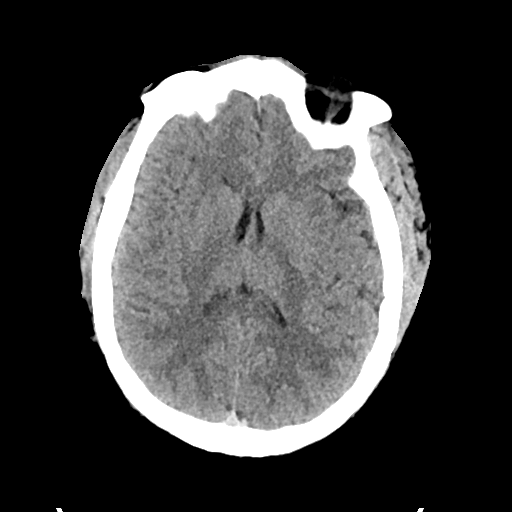
[im 24/39  brain]
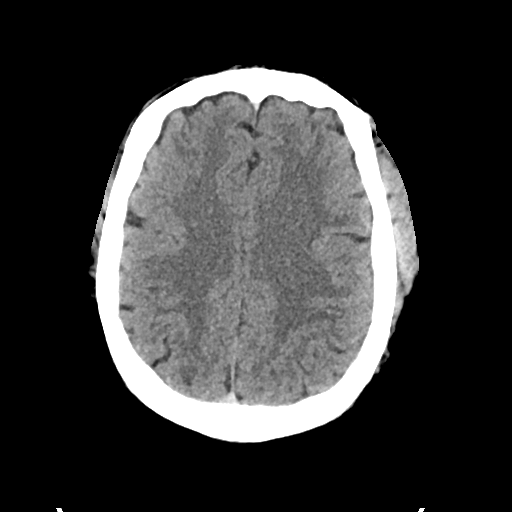
[im 24/39  bone]
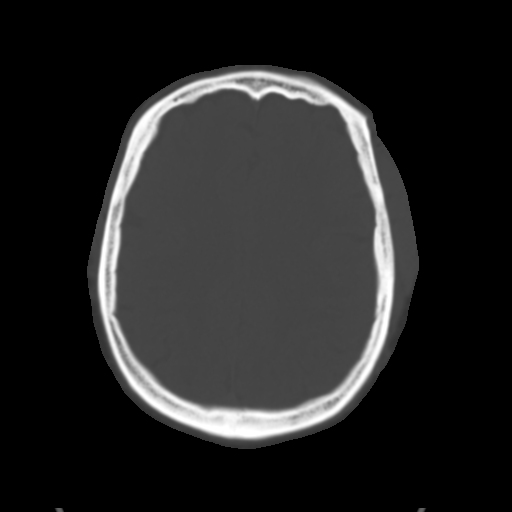
[im 29/39  brain]
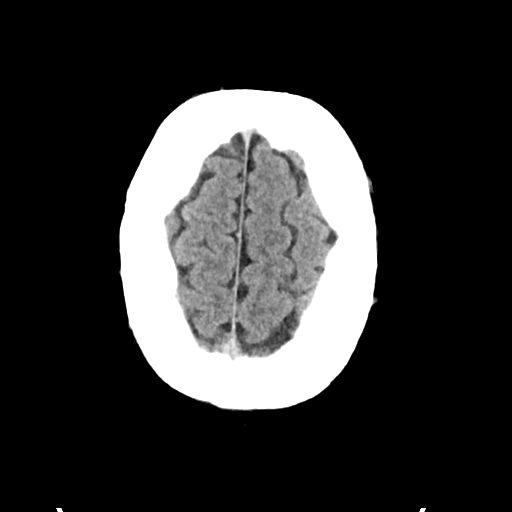
[im 34/39  brain]
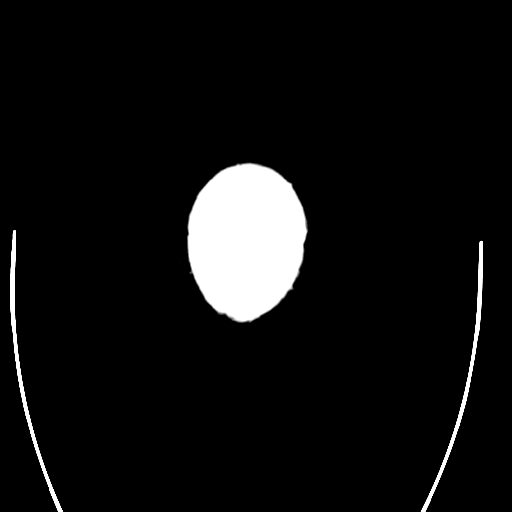

[Series 4: head bone · axial · 0.48mm/px · z∈[-140,-72]mm · 4 of 97 slices shown]
[im 10/97  bone]
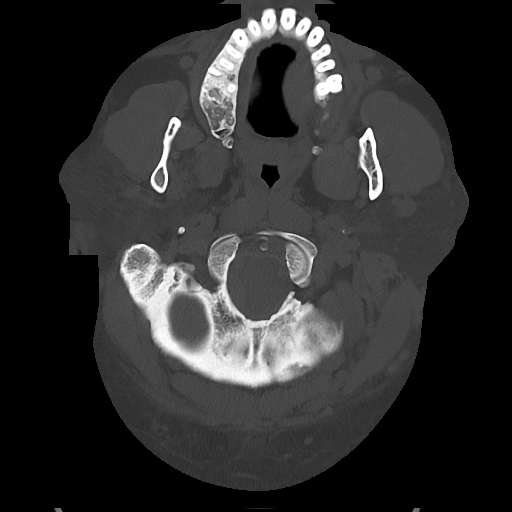
[im 20/97  bone]
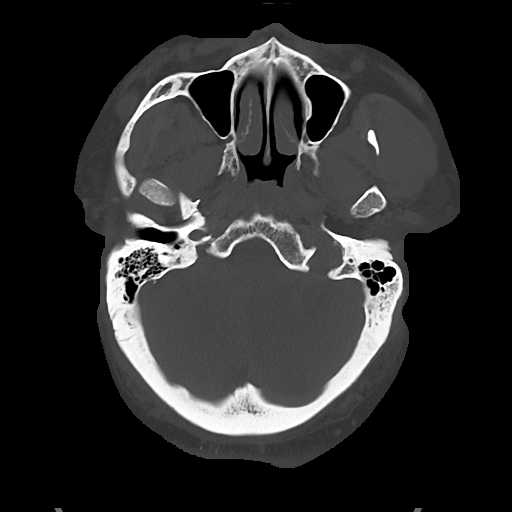
[im 29/97  bone]
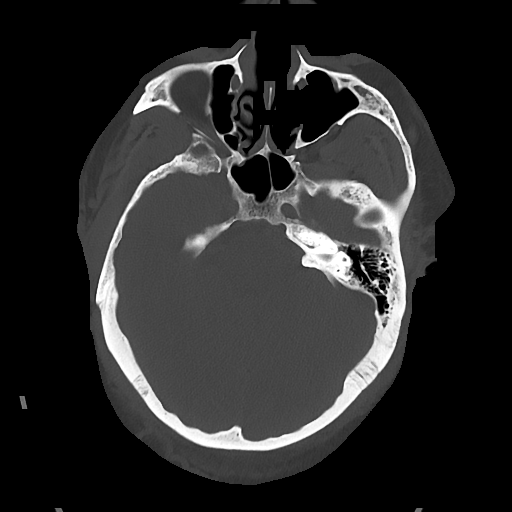
[im 44/97  bone]
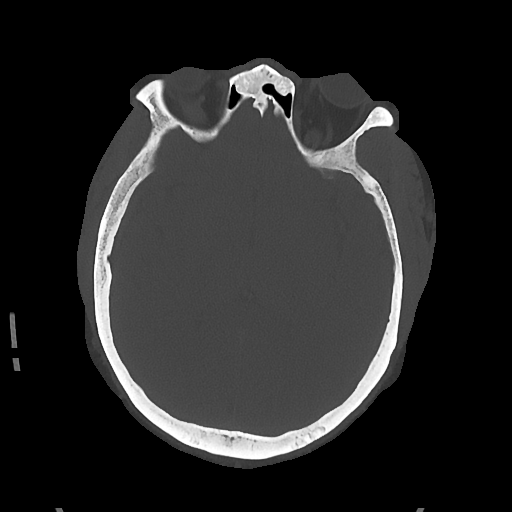

[Series 5: cor soft · coronal · 0.38mm/px · 3 of 73 slices shown]
[im 25/73  brain]
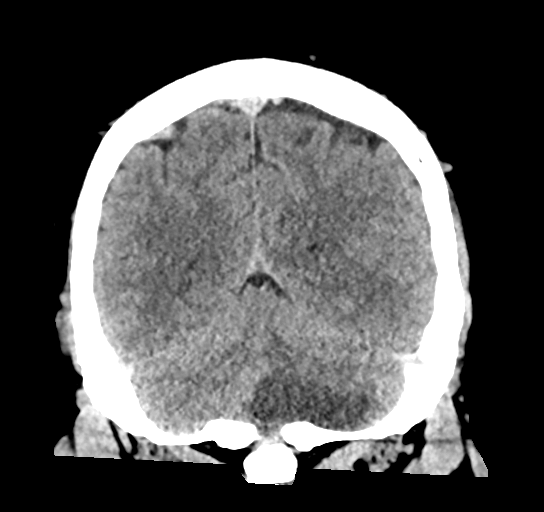
[im 33/73  brain]
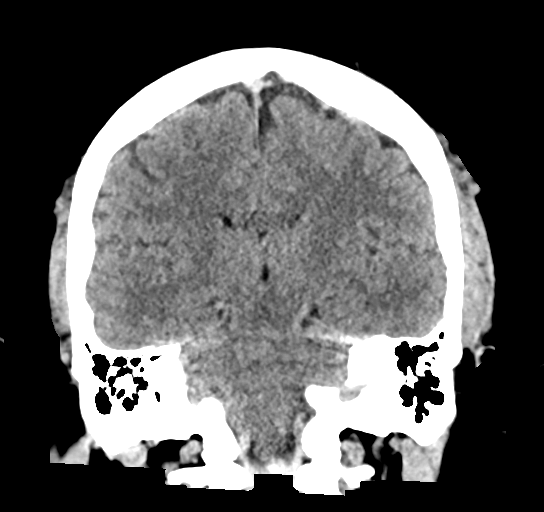
[im 41/73  brain]
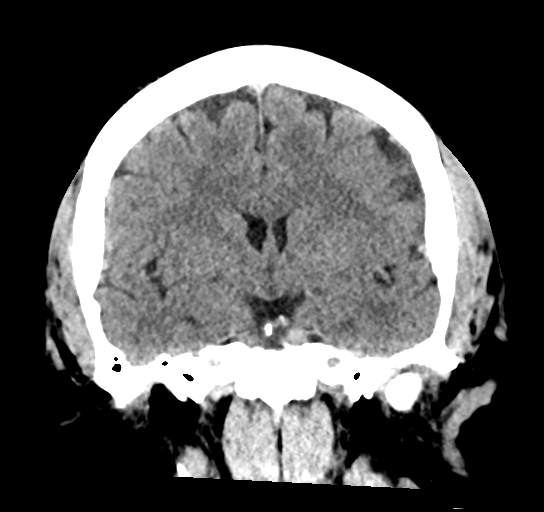

[Series 6: sag soft · sagittal · 0.38mm/px · 3 of 69 slices shown]
[im 23/69  brain]
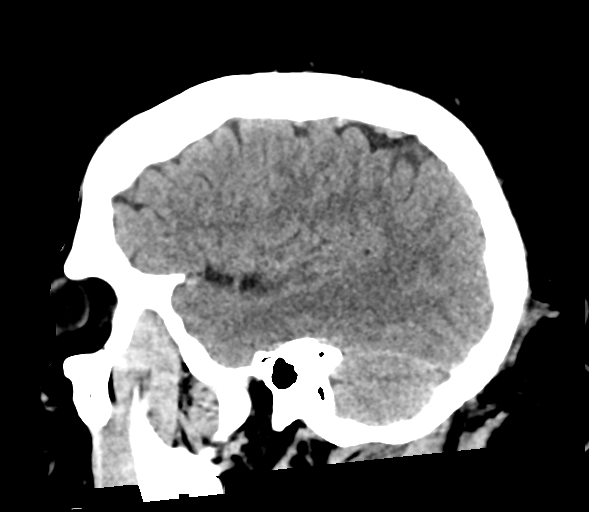
[im 35/69  brain]
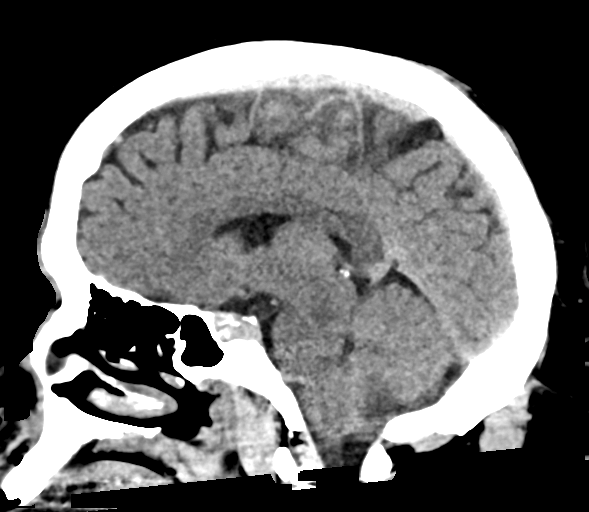
[im 46/69  brain]
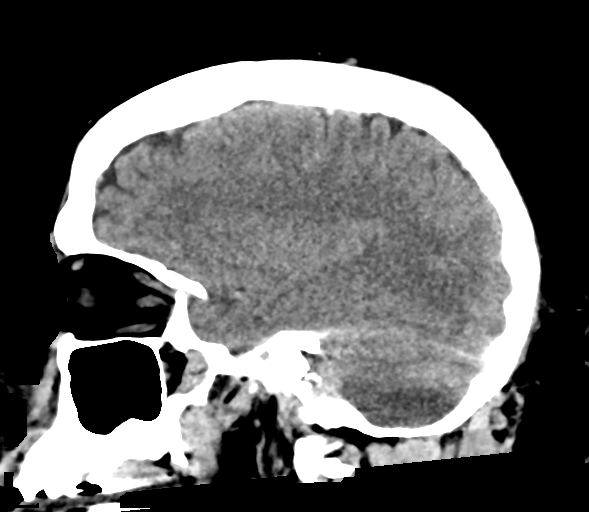

[17 of 47 positions shown; findings below may reference images not displayed]

FINDINGS: Brain: Acute left PICA territory infarct with lower fourth
ventricular effacement. No evidence of infarct progression. No
hemorrhagic conversion. Negative for hydrocephalus.

Vascular: No acute finding.  MRA was performed yesterday.

Skull: Negative

Sinuses/Orbits: Negative
IMPRESSION: Acute left PICA territory infarct with lower fourth ventricular
narrowing. No hydrocephalus or hemorrhagic conversion.

## 2022-11-23 DEATH — deceased
